# Patient Record
Sex: Female | Born: 1935 | Race: White | Hispanic: No | Smoking: Never smoker
Health system: Southern US, Community
[De-identification: ages and names within clinical notes are randomized; demographics above are authoritative.]

## PROBLEM LIST (undated history)

## (undated) DIAGNOSIS — M8580 Other specified disorders of bone density and structure, unspecified site: Secondary | ICD-10-CM

## (undated) DIAGNOSIS — I499 Cardiac arrhythmia, unspecified: Secondary | ICD-10-CM

## (undated) DIAGNOSIS — I10 Essential (primary) hypertension: Secondary | ICD-10-CM

## (undated) DIAGNOSIS — H269 Unspecified cataract: Secondary | ICD-10-CM

## (undated) DIAGNOSIS — M1990 Unspecified osteoarthritis, unspecified site: Secondary | ICD-10-CM

## (undated) DIAGNOSIS — I4891 Unspecified atrial fibrillation: Secondary | ICD-10-CM

## (undated) DIAGNOSIS — R55 Syncope and collapse: Secondary | ICD-10-CM

## (undated) HISTORY — PX: VAGINAL HYSTERECTOMY: SUR661

## (undated) HISTORY — PX: SHOULDER SURGERY: SHX246

## (undated) HISTORY — DX: Unspecified cataract: H26.9

## (undated) HISTORY — DX: Syncope and collapse: R55

## (undated) HISTORY — PX: KNEE ARTHROSCOPY: SUR90

## (undated) HISTORY — PX: KNEE ARTHROCENTESIS: SUR44

## (undated) HISTORY — PX: BREAST EXCISIONAL BIOPSY: SUR124

## (undated) HISTORY — PX: TOTAL KNEE ARTHROPLASTY: SHX125

## (undated) HISTORY — DX: Unspecified osteoarthritis, unspecified site: M19.90

## (undated) HISTORY — PX: ROTATOR CUFF REPAIR: SHX139

## (undated) HISTORY — DX: Unspecified atrial fibrillation: I48.91

## (undated) HISTORY — DX: Other specified disorders of bone density and structure, unspecified site: M85.80

---

## 1999-03-23 ENCOUNTER — Encounter: Payer: Self-pay | Admitting: Gynecology

## 1999-03-23 ENCOUNTER — Encounter: Admission: RE | Admit: 1999-03-23 | Discharge: 1999-03-23 | Payer: Self-pay | Admitting: Gynecology

## 1999-04-04 ENCOUNTER — Encounter: Payer: Self-pay | Admitting: Gynecology

## 1999-04-04 ENCOUNTER — Encounter: Admission: RE | Admit: 1999-04-04 | Discharge: 1999-04-04 | Payer: Self-pay | Admitting: Gynecology

## 2000-05-03 ENCOUNTER — Encounter: Payer: Self-pay | Admitting: Gynecology

## 2000-05-03 ENCOUNTER — Encounter: Admission: RE | Admit: 2000-05-03 | Discharge: 2000-05-03 | Payer: Self-pay | Admitting: Gynecology

## 2000-06-19 ENCOUNTER — Encounter: Payer: Self-pay | Admitting: Gynecology

## 2000-06-19 ENCOUNTER — Encounter: Admission: RE | Admit: 2000-06-19 | Discharge: 2000-06-19 | Payer: Self-pay | Admitting: Gynecology

## 2000-08-26 ENCOUNTER — Encounter: Admission: RE | Admit: 2000-08-26 | Discharge: 2000-09-20 | Payer: Self-pay | Admitting: Orthopedic Surgery

## 2001-09-05 ENCOUNTER — Encounter: Admission: RE | Admit: 2001-09-05 | Discharge: 2001-09-05 | Payer: Self-pay | Admitting: Gynecology

## 2001-09-05 ENCOUNTER — Encounter: Payer: Self-pay | Admitting: Gynecology

## 2001-09-12 ENCOUNTER — Other Ambulatory Visit: Admission: RE | Admit: 2001-09-12 | Discharge: 2001-09-12 | Payer: Self-pay | Admitting: Gynecology

## 2002-09-28 ENCOUNTER — Other Ambulatory Visit: Admission: RE | Admit: 2002-09-28 | Discharge: 2002-09-28 | Payer: Self-pay | Admitting: Gynecology

## 2002-09-28 ENCOUNTER — Encounter: Payer: Self-pay | Admitting: Family Medicine

## 2002-09-28 ENCOUNTER — Encounter: Admission: RE | Admit: 2002-09-28 | Discharge: 2002-09-28 | Payer: Self-pay | Admitting: Family Medicine

## 2003-10-13 ENCOUNTER — Other Ambulatory Visit: Admission: RE | Admit: 2003-10-13 | Discharge: 2003-10-13 | Payer: Self-pay | Admitting: Gynecology

## 2003-10-13 ENCOUNTER — Encounter: Admission: RE | Admit: 2003-10-13 | Discharge: 2003-10-13 | Payer: Self-pay | Admitting: Gynecology

## 2004-03-07 ENCOUNTER — Ambulatory Visit: Payer: Self-pay | Admitting: Family Medicine

## 2004-04-04 ENCOUNTER — Encounter: Admission: RE | Admit: 2004-04-04 | Discharge: 2004-05-08 | Payer: Self-pay | Admitting: Orthopedic Surgery

## 2004-08-28 ENCOUNTER — Ambulatory Visit: Payer: Self-pay | Admitting: Gastroenterology

## 2004-09-07 ENCOUNTER — Encounter (INDEPENDENT_AMBULATORY_CARE_PROVIDER_SITE_OTHER): Payer: Self-pay | Admitting: *Deleted

## 2004-09-07 ENCOUNTER — Ambulatory Visit: Payer: Self-pay | Admitting: Gastroenterology

## 2004-09-29 ENCOUNTER — Encounter: Admission: RE | Admit: 2004-09-29 | Discharge: 2004-09-29 | Payer: Self-pay | Admitting: Family Medicine

## 2004-10-30 ENCOUNTER — Other Ambulatory Visit: Admission: RE | Admit: 2004-10-30 | Discharge: 2004-10-30 | Payer: Self-pay | Admitting: Gynecology

## 2005-11-22 ENCOUNTER — Encounter: Admission: RE | Admit: 2005-11-22 | Discharge: 2005-11-22 | Payer: Self-pay | Admitting: Gynecology

## 2005-12-13 ENCOUNTER — Other Ambulatory Visit: Admission: RE | Admit: 2005-12-13 | Discharge: 2005-12-13 | Payer: Self-pay | Admitting: Gynecology

## 2006-01-02 ENCOUNTER — Ambulatory Visit: Payer: Self-pay | Admitting: Family Medicine

## 2006-04-19 ENCOUNTER — Ambulatory Visit: Payer: Self-pay | Admitting: Family Medicine

## 2006-05-30 ENCOUNTER — Ambulatory Visit: Payer: Self-pay | Admitting: Family Medicine

## 2006-06-26 ENCOUNTER — Encounter: Admission: RE | Admit: 2006-06-26 | Discharge: 2006-06-26 | Payer: Self-pay | Admitting: Orthopedic Surgery

## 2006-06-27 ENCOUNTER — Ambulatory Visit (HOSPITAL_BASED_OUTPATIENT_CLINIC_OR_DEPARTMENT_OTHER): Admission: RE | Admit: 2006-06-27 | Discharge: 2006-06-27 | Payer: Self-pay | Admitting: Orthopedic Surgery

## 2006-07-11 ENCOUNTER — Encounter: Admission: RE | Admit: 2006-07-11 | Discharge: 2006-08-19 | Payer: Self-pay | Admitting: Orthopedic Surgery

## 2006-11-28 ENCOUNTER — Encounter: Admission: RE | Admit: 2006-11-28 | Discharge: 2006-11-28 | Payer: Self-pay | Admitting: Gynecology

## 2006-12-19 ENCOUNTER — Other Ambulatory Visit: Admission: RE | Admit: 2006-12-19 | Discharge: 2006-12-19 | Payer: Self-pay | Admitting: Gynecology

## 2007-12-11 ENCOUNTER — Encounter: Admission: RE | Admit: 2007-12-11 | Discharge: 2007-12-11 | Payer: Self-pay | Admitting: Gynecology

## 2008-02-10 ENCOUNTER — Inpatient Hospital Stay (HOSPITAL_COMMUNITY): Admission: RE | Admit: 2008-02-10 | Discharge: 2008-02-14 | Payer: Self-pay | Admitting: Orthopedic Surgery

## 2008-03-08 ENCOUNTER — Encounter: Admission: RE | Admit: 2008-03-08 | Discharge: 2008-04-22 | Payer: Self-pay | Admitting: Orthopedic Surgery

## 2008-04-23 ENCOUNTER — Encounter: Admission: RE | Admit: 2008-04-23 | Discharge: 2008-05-05 | Payer: Self-pay | Admitting: Orthopedic Surgery

## 2009-01-26 ENCOUNTER — Encounter: Admission: RE | Admit: 2009-01-26 | Discharge: 2009-01-26 | Payer: Self-pay | Admitting: Gynecology

## 2009-02-03 ENCOUNTER — Encounter: Admission: RE | Admit: 2009-02-03 | Discharge: 2009-02-03 | Payer: Self-pay | Admitting: Gynecology

## 2009-04-23 HISTORY — PX: COLONOSCOPY: SHX174

## 2009-08-19 ENCOUNTER — Encounter (INDEPENDENT_AMBULATORY_CARE_PROVIDER_SITE_OTHER): Payer: Self-pay | Admitting: *Deleted

## 2009-10-14 ENCOUNTER — Encounter (INDEPENDENT_AMBULATORY_CARE_PROVIDER_SITE_OTHER): Payer: Self-pay | Admitting: *Deleted

## 2009-10-17 ENCOUNTER — Ambulatory Visit: Payer: Self-pay | Admitting: Gastroenterology

## 2009-11-01 ENCOUNTER — Ambulatory Visit: Payer: Self-pay | Admitting: Gastroenterology

## 2009-11-01 HISTORY — PX: POLYPECTOMY: SHX149

## 2009-11-02 ENCOUNTER — Encounter: Payer: Self-pay | Admitting: Gastroenterology

## 2010-02-07 ENCOUNTER — Encounter: Admission: RE | Admit: 2010-02-07 | Discharge: 2010-02-07 | Payer: Self-pay | Admitting: Gynecology

## 2010-02-20 ENCOUNTER — Encounter: Admission: RE | Admit: 2010-02-20 | Discharge: 2010-02-20 | Payer: Self-pay | Admitting: Gynecology

## 2010-05-25 NOTE — Letter (Signed)
Summary: [facility] Instructions  [facility]  [address] [place], [place] [address]   Phone: [phone]  Fax: [phone]       Rhonda [name]    1933/01/05    MRN: [id number]        Procedure Day /Date:  Tuesday 11/01/09     Arrival Time:  8:30 a.m.     Procedure Time:  9:30 a.m.     Location of Procedure:                    _x_  [facility] (4th Floor)                        PREPARATION FOR COLONOSCOPY WITH MOVIPREP   Starting 5 days prior to your procedure  Thursday 10/27/09 do not eat nuts, seeds, popcorn, corn, beans, peas,  salads, or any raw vegetables.  Do not take any fiber supplements (e.g. Metamucil, Citrucel, and Benefiber).  THE DAY BEFORE YOUR PROCEDURE         DATE:  10/31/09   DAY:  Monday 1.  Drink clear liquids the entire day-NO SOLID FOOD  2.  Do not drink anything colored red or purple.  Avoid juices with pulp.  No orange juice.  3.  Drink at least 64 oz. (8 glasses) of fluid/clear liquids during the day to prevent dehydration and help the prep work efficiently.  CLEAR LIQUIDS INCLUDE: Water Jello Ice Popsicles Tea (sugar ok, no milk/cream) Powdered fruit flavored drinks Coffee (sugar ok, no milk/cream) Gatorade Juice: apple, white grape, white cranberry  Lemonade Clear bullion, consomm, broth Carbonated beverages (any kind) Strained chicken noodle soup Hard Candy                             4.  In the morning, mix first dose of MoviPrep solution:    Empty 1 Pouch A and 1 Pouch B into the disposable container    Add lukewarm drinking water to the top line of the container. Mix to dissolve    Refrigerate (mixed solution should be used within 24 hrs)  5.  Begin drinking the prep at 5:00 p.m. The MoviPrep container is divided by 4 marks.   Every 15 minutes drink the solution down to the next mark (approximately 8 oz) until the full liter is complete.   6.  Follow completed prep with 16 oz of clear liquid of your  choice (Nothing red or purple).  Continue to drink clear liquids until bedtime.  7.  Before going to bed, mix second dose of MoviPrep solution:    Empty 1 Pouch A and 1 Pouch B into the disposable container    Add lukewarm drinking water to the top line of the container. Mix to dissolve    Refrigerate  THE DAY OF YOUR PROCEDURE      DATE:  11/01/09   DAY:  Tuesday  Beginning at  4:30 a.m. (5 hours before procedure):         1. Every 15 minutes, drink the solution down to the next mark (approx 8 oz) until the full liter is complete.  2. Follow completed prep with 16 oz. of clear liquid of your choice.    3. You may drink clear liquids until  7:30 a.m.  (2 HOURS BEFORE PROCEDURE).   MEDICATION INSTRUCTIONS  Unless otherwise instructed, you should take regular prescription medications with a  small sip of water   as early as possible the morning of your procedure.  Additional medication instructions: Hold Micardis HCT day of procedure.         OTHER INSTRUCTIONS  You will need a responsible adult at least 75 years of age to accompany you and drive you home.   This person must remain in the waiting room during your procedure.  Wear loose fitting clothing that is easily removed.  Leave jewelry and other valuables at home.  However, you may wish to bring a book to read or  an iPod/MP3 player to listen to music as you wait for your procedure to start.  Remove all body piercing jewelry and leave at home.  Total time from sign-in until discharge is approximately 2-3 hours.  You should go home directly after your procedure and rest.  You can resume normal activities the  day after your procedure.  The day of your procedure you should not:   Drive   Make legal decisions   Operate machinery   Drink alcohol   Return to work  You will receive specific instructions about eating, activities and medications before you leave.    The above instructions have been reviewed  and explained to me by   Ezra Sites RN  October 17, 2009 10:50 AM    I fully understand and can verbalize these instructions _____________________________ Date _________

## 2010-05-25 NOTE — Procedures (Signed)
Summary: Colonoscopy  Patient: Embry Manrique Note: All result statuses are Final unless otherwise noted.  Tests: (1) Colonoscopy (COL)   COL Colonoscopy           DONE     [facility]     [address]     [place], [place]  [address]           COLONOSCOPY PROCEDURE REPORT           PATIENT:  Rhonda [name], Rhonda [name]  MR#:  [id number]     BIRTHDATE:  [birth year], 76 yrs. old  GENDER:  female     ENDOSCOPIST:  Judie Petit T. Russella Dar, MD, [facility]           PROCEDURE DATE:  11/01/2009     PROCEDURE:  Colonoscopy with hot biopsy     ASA CLASS:  Class II     INDICATIONS:  1) surveillance and high-risk screening  2)     follow-up of polyp, adenomatous polyps, 1994, 08/2004,     MEDICATIONS:   Fentanyl 75 mcg IV, Versed 8 mg IV     DESCRIPTION OF PROCEDURE:   After the risks benefits and     alternatives of the procedure were thoroughly explained, informed     consent was obtained.  Digital rectal exam was performed and     revealed no abnormalities.   The LB PCF-H180AL [id number] endoscope     was introduced through the anus and advanced to the cecum, which     was identified by both the appendix and ileocecal valve, without     limitations.  The quality of the prep was excellent, using     MoviPrep.  The instrument was then slowly withdrawn as the colon     was fully examined.     <<PROCEDUREIMAGES>>     FINDINGS:  Moderate diverticulosis was found in the sigmoid colon.     A sessile polyp was found in the distal rectum. It was 5 mm in     size. With hot biopsy forceps, biopsy was obtained and sent to     pathology.  A normal appearing cecum, ileocecal valve, and     appendiceal orifice were identified. The ascending, hepatic     flexure, transverse, splenic flexure, descending, colon, appeared     unremarkable.  Retroflexed views in the rectum revealed internal     hemorrhoids, moderate.  The time to cecum =  4.5  minutes. The     scope was then withdrawn (time =  11.25  min) from the patient  and     the procedure completed.           COMPLICATIONS:  None           ENDOSCOPIC IMPRESSION:     1) Moderate diverticulosis in the sigmoid colon     2) 5 mm sessile polyp in the rectum     3) Internal hemorrhoids           RECOMMENDATIONS:     1) No aspirin or NSAID's for 2 weeks     2) Await pathology results     3) High fiber diet with liberal fluid intake.     4) Repeat Colonoscopy in 5 years pending pathology review           Semaj Kham T. Russella Dar, MD, [facility]           CC: Joette Catching, MD           n.  eSIGNED:   Joanny Dupree T. Chisa Kushner at 11/01/2009 10:06 AM           Lilyan Punt, [id number]  Note: An exclamation mark (!) indicates a result that was not dispersed into the flowsheet. Document Creation Date: 11/01/2009 10:07 AM _______________________________________________________________________  (1) Order result status: Final Collection or observation date-time: 11/01/2009 10:03 Requested date-time:  Receipt date-time:  Reported date-time:  Referring Physician:   Ordering Physician: Claudette Head [phone]) Specimen Source:  Source: Launa Grill Order Number: [id number] Lab site:   Appended Document: Colonoscopy     Colonoscopy  Procedure date:  11/01/2009  Findings:      Location:  [facility].    Procedures Next Due Date:    Colonoscopy: 10/2014

## 2010-05-25 NOTE — Letter (Signed)
Summary: Patient Notice-Hyperplastic Polyps  [facility]  [address] [place], [place] [address]   Phone: [phone]  Fax: [phone]        November 02, 2009 MRN: [id number]    Rhonda [name] [address] [place] [address] [place], [place]  [address]    Dear [name],  I am pleased to inform you that the colon polyp(s) removed during your recent colonoscopy was (were) found to be hyperplastic. These types of polyps are NOT pre-cancerous.  It is my recommendation that you have a repeat colonoscopy examination in 5 years for routine colorectal cancer screening.  Should you develop new or worsening symptoms of abdominal pain, bowel habit changes or bleeding from the rectum or bowels, please schedule an evaluation with either your primary care physician or with me.  Continue treatment plan as outlined the day of your exam.  Please call us if you are having persistent problems or have questions about your condition that have not been fully answered at this time.  Sincerely,  Meryl Dare MD [facility]  This letter has been electronically signed by your physician.  Appended Document: Patient Notice-Hyperplastic Polyps letter mailed.

## 2010-05-25 NOTE — Letter (Signed)
Summary: Colonoscopy Letter  [facility]  [address] [place], [place] [address]   Phone: [phone]  Fax: [phone]      August 19, 2009 MRN: [id number]   Rhonda [name] [address] [place] [address] [place], [place]  [address]   Dear [name],   According to your medical record, it is time for you to schedule a Colonoscopy. The American Cancer Society recommends this procedure as a method to detect early colon cancer. Patients with a family history of colon cancer, or a personal history of colon polyps or inflammatory bowel disease are at increased risk.  This letter has beeen generated based on the recommendations made at the time of your procedure. If you feel that in your particular situation this may no longer apply, please contact our office.  Please call our office at [phone] to schedule this appointment or to update your records at your earliest convenience.  Thank you for cooperating with [place] to provide you with the very best care possible.   Sincerely,  Judie Petit T. Russella Dar, M.D.  [facility] [phone]

## 2010-05-25 NOTE — Miscellaneous (Signed)
Summary: [facility]  Clinical Lists Changes  Medications: Added new medication of MOVIPREP 100 GM  SOLR (PEG-KCL-NACL-NASULF-NA ASC-C) As per prep instructions. - Signed Rx of MOVIPREP 100 GM  SOLR (PEG-KCL-NACL-NASULF-NA ASC-C) As per prep instructions.;  #1 x 0;  Signed;  Entered by: Ezra Sites RN;  Authorized by: Meryl Dare MD [facility];  Method used: Electronically to [facility] [phone]*, [address], [place], [place], [place]  [address], Ph: [phone] or [phone], Fax: [phone] Observations: Added new observation of NKA: T (10/17/2009 9:58)    Prescriptions: MOVIPREP 100 GM  SOLR (PEG-KCL-NACL-NASULF-NA ASC-C) As per prep instructions.  #1 x 0   Entered by:   Ezra Sites RN   Authorized by:   Meryl Dare MD [facility]   Signed by:   Ezra Sites RN on 10/17/2009   Method used:   Electronically to        [facility] [phone]* (retail)       [address]       [place], [place]  [address]       Ph: [phone] or [phone]       Fax: [phone]   RxID:   [id number]

## 2010-09-05 NOTE — Discharge Summary (Signed)
NAMEJASEY, Rhonda [name]               ACCOUNT NO.:  [id number]   MEDICAL RECORD NO.:  [id number]          PATIENT TYPE:  INP   LOCATION:  5035                         FACILITY:  [facility]   PHYSICIAN:  Rodney A. Mortenson, M.D.DATE OF BIRTH:  04-20-1933   DATE OF ADMISSION:  02/10/2008  DATE OF DISCHARGE:  02/14/2008                               DISCHARGE SUMMARY   ADMISSION DIAGNOSIS:  Osteoarthritis, left knee.   DISCHARGE DIAGNOSES:  1. Osteoarthritis, left knee.  2. Hypertension.  3. Posthemorrhagic anemia.  4. Hypokalemia.   PROCEDURE:  Left total knee arthroplasty.   HISTORY:  Rhonda [name] is a very pleasant 77 year old white married female with  constant moderately painful left knee.  She has mainly throbbing and  aching pain now with occasional sharp stabbing pain.  She had previous  arthroscopy in 2005 of the left knee.  She now has pain with walking,  standing, and arising from a chair.  Pain at night as well as with  activities of daily living.  She has radiographic bone-on-bone  osteoarthritis.  She is indicated now for a left total knee  arthroplasty.   HOSPITAL COURSE:  A 77 year old female admitted on February 10, 2008,  after appropriate laboratory studies were obtained as well as 1 g Ancef  IV on call to the operating room.  She was taken to the operating room  where she underwent a left total knee arthroplasty.  She tolerated the  procedure well.  She was placed on Dilaudid PCA pump.  Arixtra 2.5 mg q.  8 a.m. was started subcutaneously for antithrombotic prophylaxis.  Consult PT, OT, care management were made.  Ambulating weight bear as  tolerated.  CPM 0-60 degrees for 6-8 hours per day, increase by 5-10  degrees per day.  Foley was placed intraoperatively.  She allowed out of  bed to chair the following day.  Weaned off her PCA and her O2 keeping  her sats greater than 92% over the next 24-48 hours.  Foley was  discontinued on February 12, 2008, after the morning therapy  session.  She did have hypokalemia on February 13, 2008.  She was started on 20 mEq  of KCl b.i.d.  Chest x-ray to rule out pneumonia as well as a UA clean  catch for routine C&S were ordered.  CBC and BMET in morning.  MiraLax  17 g in 8 ounces of water.  Dulcolax suppository on February 14, 2008,  was given and she was discharged home after she had bowel movement.   EKG showed normal sinus rhythm with normal EKG.   LABORATORY STUDIES:  Admitted with hemoglobin 15.7, hematocrit 47.3%,  white count 6900, and platelets 261,000.  Discharge hemoglobin 9.7,  hematocrit 28.0%, white count 7800, and platelets 23,000.  Preop sodium  137, potassium 4.6, chloride 100, CO2 28, glucose 83, BUN 15, and  creatinine 0.80.  Discharge sodium 138, potassium 3.8, chloride 100, CO2  30, glucose 111, BUN 9, and creatinine 0.73.  Preop GFR was greater than  60, discharge was greater than 60.  Total protein preop 7.0, albumin  4.1, AST 26, ALT  28, ALP 91, and bilirubin 0.9.  Urinalysis on February 04, 2008, revealed rare epithelials and negative leukocyte esterase.  Urinalysis on February 12, 2008, revealed cloudy with moderate amount of  hemoglobin, few epithelium, 0-2 whites, 7-10 reds, and a few bacteria.  Urine cultures of February 04, 2008 and February 13, 2008 were felt no  growth.   DISCHARGE INSTRUCTIONS:  No restriction on her diet.  Follow the blue  instruction sheet and keep the incision clean and dry and change  dressing daily.  She may use a walker ambulating weightbearing as  tolerated.  No driving or lifting for 6 weeks.  CPM 0-70 degrees for 6-8  hours per day and then increase by 5-10 degrees per day.  Percocet 5/325  one to two tablets every 4 hours a day for pain.  Robaxin 500 mg 1  tablet every 6-8 hours as needed for spasms.  Arixtra 2.5 mg inject  daily at 8 a.m. as instructed.  Last dose was on February 17, 2008.  Then  she is to begin aspirin 81 mg on February 18, 2008, for 1 month.  Use   Colace and MiraLax for constipation.  Continue on the Lovenox  instructed.  Follow back up with [place] on February 23, 2008, for a recheck  evaluation with Dr. Chaney Malling.      Rhonda [name], P.A.-C.      Rodney A. Chaney Malling, M.D.  Electronically Signed    BDP/MEDQ  D:  03/21/2008  T:  03/22/2008  Job:  [id number]

## 2010-09-05 NOTE — Op Note (Signed)
Rhonda [name], Rhonda [name]               ACCOUNT NO.:  [id number]   MEDICAL RECORD NO.:  [id number]          PATIENT TYPE:  INP   LOCATION:  5035                         FACILITY:  [facility]   PHYSICIAN:  Rodney A. Mortenson, M.D.DATE OF BIRTH:  [birth year]   DATE OF PROCEDURE:  02/10/2008  DATE OF DISCHARGE:                               OPERATIVE REPORT   PREOPERATIVE DIAGNOSIS:  End-stage osteoarthritis, left knee.   POSTOPERATIVE DIAGNOSIS:  End-stage osteoarthritis, left knee.   OPERATION:  Left total knee replacement using DePuy components with a  standard left cemented femoral component and a cemented size 3 MBT keel  tibial tray with a 15-mm poly insert and an LCS metal back patella,  standard cemented.   SURGEON:  Lenard Galloway. Chaney Malling, MD   ASSISTANT:  Oris Drone. Petrarca, PA-C   ANESTHESIA:  Femoral nerve block and general.   PROCEDURE:  The patient was placed on the operative table in the supine  position.  Pneumatic tourniquet was brought at the left upper thigh.  The entire left lower extremity was prepped with DuraPrep and draped out  in the usual manner.  Leg was wrapped out in Esmarch.  Tourniquet was  elevated.  An incision made starting at tibial tubercle and carried  about 3 inches as well as to 4 inches above the superior pole of  patella.  Skin edges were retracted.  Bleeders were coagulated.  A long  medial parapatellar incision was then done and the patella was everted.  The fat pad was removed.  The medial lateral meniscus were excised.  Osteophytes on both the medial and lateral condyles were debrided and  the femur was sized and this measured to be standard.  Schanz pins were  placed in distal femur and proximal tibia in the standard manner and the  femoral and tibial arrays were applied.  We then started registration to  define the mechanical axis.  First the femoral head center was  registered and the tibial mechanical axis.  We then defined the proximal  tibial  plateau and then defined the distal femoral condyles and  epicondyles following all the computer prompting.  Once this was done,  this was entered in the computer and suggestions were tibial resections  were made.  Following these recommendations, the tibial cutting block  was placed on the tibia and lined up according to the promptings and  this was then locked in place with locking pins.  Capture guide was  applied and proximal tibia was resected.  The confirmation guide was  then placed in the cut surface, several small adjustments were made, but  an excellent cut was achieved.  Next, soft tissue balancing was done  first in extension and then flexion using a tension device.  This was  all inserted into the computer program.  The femoral implant planning  was then registered in a standard manner and then the femoral resections  were done following computer promptings.  First, anterior femoral  resection was done.  Anterior and posterior cuts were made.  Then block  #2 was placed over the distal cut surface  of the femur, locked in place  following computer promptings and distal end of the femur was amputated.  Confirmation guide was used and several minor corrections were made.  The mechanical spacer block was inserted in both flexion/extension,  there was wonderful balancing of both the collateral ligaments in  flexion and extension.  Next, the final chamfer cutting guide was placed  over distal end of the femur locked in place and the chamfer cuts and  drill holes were made.  This was then cleaned up and osteophytes removal  of the posterior aspect of the medial femoral condyle.  The tibia was  then subluxed anteriorly and the tibia trial was put in place and locked  in place.  Next, the smokestack was inserted and a drill hole was placed  in the proximal tibia.  The winged keel guide was then driven down in  through the trial plate and locked in position.  Next, the 15-mm poly  was  inserted and the trial standard size femoral component was inserted  and knee put through a full range of motion.  There was an excellent  range of motion.  Full flexion, full extension, excellent balance of  ligaments in flexion extension.  No varus or valgus instability.  AP  drawer was negative.  There was excellent tracking of the tibia on the  femur.  Next, the posterior aspect of patella was amputated and the  patella trial was inserted.  The knee could then be flexed, extended,  and the patella trial tracked very nicely midline with no tilting.  No  lateral release was felt to be necessary.  I was very pleased with all  the settings and the balancing of the ligaments.  All the trial  components were then removed and pulsating lavage was used to remove all  debris.  Glue was mixed.  Each of the components was then glued in place  sequentially starting first with the tibia and then the distal femur,  and then the patella.  All excess glue was removed.  Once the glue had  cured, the trial poly was removed.  Tourniquet was dropped.  There was a  bleeder in the popliteal area and hemoclips were used to clamp off the  small vessel.  Nice hemostasis was achieved.  FloSeal was also placed in  the wound.  Throughout the procedure, all debris had been removed with a  pulsating lavage.  The long medial parapatellar incision was then closed  with interrupted Ethibond sutures.  Vicryl was used to close the  subcutaneous tissue and stainless steel staples were used to close the  skin.  Marcaine was placed around the calves prior to closure.  Sterile  dressing was applied and the patient returned to recovery room in  excellent condition.  Technically, this went extremely well.  Drains  none.  Complications none.  Very pleased with final construct.      Rodney A. Chaney Malling, M.D.  Electronically Signed     RAM/MEDQ  D:  02/10/2008  T:  02/11/2008  Job:  [id number]

## 2010-09-08 NOTE — Op Note (Signed)
NAMEALONNI, Rhonda [name]               ACCOUNT NO.:  [id number]   MEDICAL RECORD NO.:  [id number]          PATIENT TYPE:  AMB   LOCATION:  DSC                          FACILITY:  [facility]   PHYSICIAN:  Rodney A. Mortenson, M.D.DATE OF BIRTH:  August 10, 1932   DATE OF PROCEDURE:  06/27/2006  DATE OF DISCHARGE:                               OPERATIVE REPORT   JUSTIFICATION:  A 76 year old female seen by Dr. Lysbeth Galas who presents  with a 3-year history of progressive slowly increasing pain about her  knee.  She has trouble going up and down stairs.  There is acute  tenderness along the medial joint line and slight tenderness along  lateral joint line.  An MRI was done that showed severe tricompartmental  arthritis and complex tearing of the lateral meniscus.  Because of  persistent pain and discomfort, it felt that arthroscopic evaluation and  treatment were indicated and necessary.  Questions answered and  encouraged preoperatively.  Complications discussed.  It was clearly  explained that we could address the meniscal problems but not the  arthritic problems in the knee, and hopefully, this would reduce her  pain and discomfort, and she clearly understand this and wishes to  proceed.   JUSTIFICATION OF OUTPATIENT SURGERY:  Minimal morbidity.   PREOPERATIVE DIAGNOSIS:  Tear, lateral meniscus, right knee.   POSTOPERATIVE DIAGNOSIS:  Extremely complex extensive tear, lateral  meniscus, right knee; tear medial meniscus; osteophyte intercondylar  notch; areas of severe osteoarthritis lateral femoral condyle and medial  femoral condyle and lateral tibial plateau.   OPERATION:  Arthroscopy; medial lateral meniscectomy; removal of  osteophytes intercondylar notch.   SURGEON:  Lenard Galloway. Chaney Malling, M.D.   ANESTHESIA:  MAC followed by general.   PATHOLOGY:  With the arthroscope, a very careful examination of the knee  was undertaken.  Patellofemoral joints showed some chondromalacia of the  patella, but over the anterior aspect, the lateral femoral condyle was  total loss for articular cartilage.  Over the weightbearing area of the  medial femoral condyle was extensive cartilage loss.  The anterior  cruciate ligament was intact, but there was a huge osteophyte in the  intercondylar notch, and with full extension of the knee, the osteophyte  banked up against the femoral notch area.  In the medial compartment,  there was some fraying and tearing of the leading edge of the posterior  third of the medial meniscus.  In the lateral meniscus, there was a huge  tear of the anterior horn which was displaced and a complex fraying and  tearing of the entire lateral meniscus which extends from the anterior  horn all the way to the posterior horn.  There is an area of total loss  of the articular cartilage over the weightbearing area over the lateral  tibial plateau.   PROCEDURE:  The patient was placed on the operating table in the supine  position.  Pneumatic tourniquet about the left upper thigh.  An infusion  cannula was placed in the superior and medial pouch and the knee  distended with saline.  Anteromedial and anterolateral portals were  made,  and the arthroscope was introduced.  Findings were described as  above.  Attention first turned to the intercondylar notch area.  There  is a huge osteophyte in the tibial spine area that was banking up  against the intercondylar notch.  Through the lateral portal an  osteotome was inserted and the base of the osteophyte was amputated.  A  pituitary was inserted and a large osteophyte was removed as a single  large entity, and this decompressed the problem very nicely.  Attention  was then turned to the medial compartment.  There was a gentle tearing  of the posterior third of the medial meniscus, and through both portals,  a series of baskets were inserted, and this area was debrided.  This was  followed by the intra-articular shaver.   All debris was removed, and the  posterior rim was smooth and balanced in a nice transition to the mid  third of medial meniscus.  Arthroscope was then brought to the lateral  compartment.  There was a huge displaced tear of the anterior horn, and  this was debrided with chondroplasty shaver.  The arthroscope was then  induced in the posterior part of the lateral compartment.  A series of  baskets were inserted, and there was a very complex tear of the lateral  meniscus which essentially involved the entire meniscus.  Once this was  debrided, intra-articular shaver was introduced through both portals in  sequence, and a subtotal lateral meniscectomy was accomplished.  Again,  there was a very large area of total loss of articular cartilage of the  weightbearing area of the lateral tibial plateau.  Marcaine was then  placed, and a large bulky pressure dressing applied, and the patient  returned recovery room in excellent condition.  Technically, this  procedure went extremely well.   FOLLOW-UP CARE:  1. To my office on Wednesday.  2. Mepergan Fortes for pain.  3. Usual postoperative instructions given.  4. A copy of this note to Dr. Joette Catching.           ______________________________  Lenard Galloway. Chaney Malling, M.D.     RAM/MEDQ  D:  06/27/2006  T:  06/27/2006  Job:  [id number]   cc:   [name], M.D.

## 2011-01-18 ENCOUNTER — Other Ambulatory Visit: Payer: Self-pay | Admitting: Gynecology

## 2011-01-18 DIAGNOSIS — Z1231 Encounter for screening mammogram for malignant neoplasm of breast: Secondary | ICD-10-CM

## 2011-01-22 LAB — COMPREHENSIVE METABOLIC PANEL
AST: 26
Alkaline Phosphatase: 91
CO2: 28
Calcium: 9.6
Chloride: 100
GFR calc non Af Amer: >60
Glucose, Bld: 83
Potassium: 4.6
Total Bilirubin: 0.9

## 2011-01-22 LAB — BASIC METABOLIC PANEL
BUN: 14
BUN: 16
CO2: 29
CO2: 30
Calcium: 8 — ABNORMAL LOW
Calcium: 8.1 — ABNORMAL LOW
Calcium: 8.2 — ABNORMAL LOW
Chloride: 103
Creatinine, Ser: 0.73
Creatinine, Ser: 0.98
Creatinine, Ser: 1.06
GFR calc Af Amer: >60
GFR calc Af Amer: >60
GFR calc non Af Amer: 51 — ABNORMAL LOW
GFR calc non Af Amer: 56 — ABNORMAL LOW
GFR calc non Af Amer: >60
Glucose, Bld: 106 — ABNORMAL HIGH
Glucose, Bld: 111 — ABNORMAL HIGH
Potassium: 3.4 — ABNORMAL LOW
Potassium: 3.8
Sodium: 138
Sodium: 139

## 2011-01-22 LAB — CBC
HCT: 27.3 — ABNORMAL LOW
HCT: 28 — ABNORMAL LOW
HCT: 38
HCT: 38
HCT: 47.3 — ABNORMAL HIGH
Hemoglobin: 12.6
Hemoglobin: 12.6
Hemoglobin: 9.7 — ABNORMAL LOW
MCHC: 33.2
MCV: 91.4
MCV: 91.8
Platelets: 178
Platelets: 241
Platelets: 261
RBC: 3.33 — ABNORMAL LOW
RBC: 4.17
RBC: 5.17 — ABNORMAL HIGH
RDW: 12.9
RDW: 13
WBC: 14.7 — ABNORMAL HIGH
WBC: 6.9
WBC: 7.8

## 2011-01-22 LAB — URINE MICROSCOPIC-ADD ON

## 2011-01-22 LAB — URINE CULTURE: Culture: NO GROWTH

## 2011-01-22 LAB — URINALYSIS, ROUTINE W REFLEX MICROSCOPIC
Bilirubin Urine: NEGATIVE
Glucose, UA: NEGATIVE
Ketones, ur: NEGATIVE
Nitrite: NEGATIVE
Nitrite: NEGATIVE
Protein, ur: NEGATIVE
Specific Gravity, Urine: 1.015
pH: 5.5
pH: 6

## 2011-01-22 LAB — DIFFERENTIAL
Basophils Absolute: 0.1
Basophils Relative: 1
Lymphs Abs: 1.4
Monocytes Absolute: 0.4
Neutro Abs: 5

## 2011-01-22 LAB — PROTIME-INR
INR: 0.9
Prothrombin Time: 12.3

## 2011-02-22 ENCOUNTER — Other Ambulatory Visit: Payer: Self-pay | Admitting: Gynecology

## 2011-02-22 ENCOUNTER — Ambulatory Visit: Payer: Self-pay

## 2011-02-23 ENCOUNTER — Ambulatory Visit
Admission: RE | Admit: 2011-02-23 | Discharge: 2011-02-23 | Disposition: A | Discharge status: Home / Self Care | Payer: BLUE CROSS/BLUE SHIELD | Source: Ambulatory Visit | Attending: Gynecology | Admitting: Gynecology

## 2012-02-01 ENCOUNTER — Other Ambulatory Visit: Payer: Self-pay | Admitting: Gynecology

## 2012-02-01 DIAGNOSIS — Z1231 Encounter for screening mammogram for malignant neoplasm of breast: Secondary | ICD-10-CM

## 2012-02-27 ENCOUNTER — Ambulatory Visit
Admission: RE | Admit: 2012-02-27 | Discharge: 2012-02-27 | Disposition: A | Discharge status: Home / Self Care | Payer: Medicare Other | Source: Ambulatory Visit | Attending: Gynecology | Admitting: Gynecology

## 2012-02-27 DIAGNOSIS — Z1231 Encounter for screening mammogram for malignant neoplasm of breast: Secondary | ICD-10-CM

## 2013-02-11 ENCOUNTER — Other Ambulatory Visit: Payer: Self-pay

## 2013-02-11 DIAGNOSIS — Z1231 Encounter for screening mammogram for malignant neoplasm of breast: Secondary | ICD-10-CM

## 2013-03-11 ENCOUNTER — Ambulatory Visit
Admission: RE | Admit: 2013-03-11 | Discharge: 2013-03-11 | Disposition: A | Discharge status: Home / Self Care | Payer: Medicare Other | Source: Ambulatory Visit

## 2013-03-11 DIAGNOSIS — Z1231 Encounter for screening mammogram for malignant neoplasm of breast: Secondary | ICD-10-CM

## 2013-12-10 ENCOUNTER — Encounter: Payer: Self-pay | Admitting: Gastroenterology

## 2014-01-07 DIAGNOSIS — R42 Dizziness and giddiness: Secondary | ICD-10-CM | POA: Insufficient documentation

## 2014-01-07 DIAGNOSIS — E785 Hyperlipidemia, unspecified: Secondary | ICD-10-CM | POA: Insufficient documentation

## 2014-01-21 DIAGNOSIS — J0100 Acute maxillary sinusitis, unspecified: Secondary | ICD-10-CM | POA: Insufficient documentation

## 2014-02-08 ENCOUNTER — Other Ambulatory Visit: Payer: Self-pay

## 2014-02-08 DIAGNOSIS — Z1231 Encounter for screening mammogram for malignant neoplasm of breast: Secondary | ICD-10-CM

## 2014-03-16 ENCOUNTER — Telehealth: Payer: Self-pay | Admitting: Cardiology

## 2014-03-16 NOTE — Telephone Encounter (Signed)
Received records from [facility])  (Dr Dione Housekeeper) for appointment with Dr Percival Spanish on 05/05/14.  Records given to [facility] (medical records) for Dr Hochrein's schedule on 05/05/14. lp

## 2014-03-25 ENCOUNTER — Ambulatory Visit: Payer: Self-pay

## 2014-04-14 ENCOUNTER — Ambulatory Visit: Payer: Self-pay

## 2014-04-23 DIAGNOSIS — R55 Syncope and collapse: Secondary | ICD-10-CM

## 2014-04-23 HISTORY — DX: Syncope and collapse: R55

## 2014-04-28 ENCOUNTER — Telehealth: Payer: Self-pay | Admitting: Cardiology

## 2014-04-28 NOTE — Telephone Encounter (Signed)
Received records from [facility] (Dr Dione Housekeeper) for appointment with Dr Percival Spanish on 05/05/14.  Records given to [facility] (medical records) for Dr Hochrein's schedule on 05/05/14.  lp

## 2014-05-05 ENCOUNTER — Ambulatory Visit (INDEPENDENT_AMBULATORY_CARE_PROVIDER_SITE_OTHER): Payer: Self-pay | Admitting: Cardiology

## 2014-05-05 ENCOUNTER — Encounter: Payer: Self-pay | Admitting: Cardiology

## 2014-05-05 VITALS — BP 122/84 | HR 74 | Ht 65.0 in | Wt 162.0 lb

## 2014-05-05 DIAGNOSIS — R55 Syncope and collapse: Secondary | ICD-10-CM | POA: Insufficient documentation

## 2014-05-05 NOTE — Progress Notes (Signed)
   HPI The patient presents for evaluation of 6 feet. This has been at the beginning of November. She was seated at her records are in her house. She had not eaten breakfast. She felt nauseated and lightheaded. She got herself up to a chair where she probably passed out for a few seconds. She was diaphoretic when she woke up she was otherwise well. She has not had any of these events since then. She doesn't feel any palpitations, presyncope or syncope. She has no orthostatic symptoms. She denies any chest pressure, neck or arm discomfort. She remains active. She did exercise most of her life. She did have one episode of syncope 10 years ago and wore a monitor but had no etiology identified. She otherwise has no past cardiac history.  Of note she did see Dr. Edrick Oh.  I reviewed the factors. She had normal lites. She had normal TSH.  No Known Allergies  MEDS:  None   PMH: None  PSH:  Vaginal hysterectomy, oophorectomy, total knee replacement left, shoulder surgery left, multiple knee arthroscopies   Family History  Problem Relation Age of Onset  . Heart failure Mother   . Hypertension Sister   . Hypertension Brother     History   Social History  . Marital Status: Married    Spouse Name: N/A    Number of Children: 1  . Years of Education: N/A   Occupational History  . Not on file.   Social History Main Topics  . Smoking status: Never Smoker   . Smokeless tobacco: Not on file  . Alcohol Use: Not on file  . Drug Use: Not on file  . Sexual Activity: Not on file   Other Topics Concern  . Not on file   Social History Narrative   Lives with husband.      ROS:  As stated in the HPI and negative for all other systems.  PHYSICAL EXAM BP 122/84 mmHg  Pulse 74  Ht 5\' 5"  (1.651 m)  Wt 162 lb (73.483 kg)  BMI 26.96 kg/m2  GENERAL:  Well appearing HEENT:  Pupils equal round and reactive, fundi not visualized, oral mucosa unremarkable NECK:  No jugular venous distention, waveform  within normal limits, carotid upstroke brisk and symmetric, no bruits, no thyromegaly LYMPHATICS:  No cervical, inguinal adenopathy LUNGS:  Clear to auscultation bilaterally BACK:  No CVA tenderness CHEST:  Unremarkable HEART:  PMI not displaced or sustained,S1 and S2 within normal limits, no S3, no S4, no clicks, no rubs, no murmurs ABD:  Flat, positive bowel sounds normal in frequency in pitch, no bruits, no rebound, no guarding, no midline pulsatile mass, no hepatomegaly, no splenomegaly EXT:  2 plus pulses throughout, no edema, no cyanosis no clubbing SKIN:  No rashes no nodules NEURO:  Cranial nerves II through XII grossly intact, motor grossly intact throughout PSYCH:  Cognitively intact, oriented to person place and time   EKG:  Sinus rhythm, 74, left axis deviation, mild QTC prolongation, no T wave changes.  05/05/2014   ASSESSMENT AND PLAN  SYNCOPE:  I suspect this was a vagal episode. I don't think further cardiovascular testing would be warranted if she has no symptoms. However, she'll let me know if this happens in the future.  QT PROLONGED:  This is a mild abnormality. Avoid QT prolonging drugs in the future but otherwise would not suggest that this was related. No further workup is suggested.

## 2014-05-05 NOTE — Patient Instructions (Signed)
The current medical regimen is effective;  continue present plan and medications.  Follow up as needed.  Thank you for choosing [facility]!!

## 2014-05-17 ENCOUNTER — Ambulatory Visit: Payer: Self-pay

## 2014-09-09 ENCOUNTER — Encounter: Payer: Self-pay | Admitting: Gastroenterology

## 2014-10-04 ENCOUNTER — Encounter: Payer: Self-pay | Admitting: Gastroenterology

## 2014-11-03 ENCOUNTER — Ambulatory Visit: Payer: Self-pay

## 2014-11-05 ENCOUNTER — Ambulatory Visit
Admission: RE | Admit: 2014-11-05 | Discharge: 2014-11-05 | Disposition: A | Discharge status: Home / Self Care | Payer: Medicare Other | Source: Ambulatory Visit

## 2014-11-05 DIAGNOSIS — Z1231 Encounter for screening mammogram for malignant neoplasm of breast: Secondary | ICD-10-CM

## 2014-11-26 ENCOUNTER — Ambulatory Visit (AMBULATORY_SURGERY_CENTER): Payer: Self-pay

## 2014-11-26 VITALS — Ht 66.0 in | Wt 161.0 lb

## 2014-11-26 DIAGNOSIS — Z86010 Personal history of colonic polyps: Secondary | ICD-10-CM

## 2014-11-26 NOTE — Progress Notes (Signed)
No hx of anesthesia complications No egg or soy allergies Pt doesn't take diet drugs or medications Not on home 02

## 2014-12-09 ENCOUNTER — Encounter: Payer: Self-pay | Admitting: Gastroenterology

## 2014-12-09 ENCOUNTER — Ambulatory Visit (AMBULATORY_SURGERY_CENTER): Payer: Medicare Other | Admitting: Gastroenterology

## 2014-12-09 VITALS — BP 149/70 | HR 65 | Temp 97.2°F | Resp 24 | Ht 66.0 in | Wt 161.0 lb

## 2014-12-09 DIAGNOSIS — Z86010 Personal history of colonic polyps: Secondary | ICD-10-CM | POA: Diagnosis not present

## 2014-12-09 MED ORDER — SODIUM CHLORIDE 0.9 % IV SOLN: 500.0000 mL | INTRAVENOUS | Status: DC

## 2014-12-09 NOTE — Progress Notes (Signed)
Report to PACU, RN, vss, BBS= Clear.  

## 2014-12-09 NOTE — Patient Instructions (Signed)
YOU HAD AN ENDOSCOPIC PROCEDURE TODAY AT [facility]:   Refer to the procedure report that was given to you for any specific questions about what was found during the examination.  If the procedure report does not answer your questions, please call your gastroenterologist to clarify.  If you requested that your care partner not be given the details of your procedure findings, then the procedure report has been included in a sealed envelope for you to review at your convenience later.  YOU SHOULD EXPECT: Some feelings of bloating in the abdomen. Passage of more gas than usual.  Walking can help get rid of the air that was put into your GI tract during the procedure and reduce the bloating. If you had a lower endoscopy (such as a colonoscopy or flexible sigmoidoscopy) you may notice spotting of blood in your stool or on the toilet paper. If you underwent a bowel prep for your procedure, you may not have a normal bowel movement for a few days.  Please Note:  You might notice some irritation and congestion in your nose or some drainage.  This is from the oxygen used during your procedure.  There is no need for concern and it should clear up in a day or so.  SYMPTOMS TO REPORT IMMEDIATELY:   Following lower endoscopy (colonoscopy or flexible sigmoidoscopy):  Excessive amounts of blood in the stool  Significant tenderness or worsening of abdominal pains  Swelling of the abdomen that is new, acute  Fever of 100F or higher  For urgent or emergent issues, a gastroenterologist can be reached at any hour by calling [phone].   DIET: Your first meal following the procedure should be a small meal and then it is ok to progress to your normal diet. Heavy or fried foods are harder to digest and may make you feel nauseous or bloated.  Likewise, meals heavy in dairy and vegetables can increase bloating.  Drink plenty of fluids but you should avoid alcoholic beverages for 24  hours.  ACTIVITY:  You should plan to take it easy for the rest of today and you should NOT DRIVE or use heavy machinery until tomorrow (because of the sedation medicines used during the test).    FOLLOW UP: Our staff will call the number listed on your records the next business day following your procedure to check on you and address any questions or concerns that you may have regarding the information given to you following your procedure. If we do not reach you, we will leave a message.  However, if you are feeling well and you are not experiencing any problems, there is no need to return our call.  We will assume that you have returned to your regular daily activities without incident.  If any biopsies were taken you will be contacted by phone or by letter within the next 1-3 weeks.  Please call us at [phone] if you have not heard about the biopsies in 3 weeks.   SIGNATURES/CONFIDENTIALITY: You and/or your care partner have signed paperwork which will be entered into your electronic medical record.  These signatures attest to the fact that that the information above on your After Visit Summary has been reviewed and is understood.  Full responsibility of the confidentiality of this discharge information lies with you and/or your care-partner.  Please read over handouts about diverticulosis, hemorrhoids and high fiber diets  Continue your normal medications

## 2014-12-09 NOTE — Op Note (Signed)
[facility]  [address] [place], [id number]   COLONOSCOPY PROCEDURE REPORT  PATIENT: [name], Rhonda  MR#: [id number] BIRTHDATE: 17-Oct-1932 , 82  yrs. old GENDER: female ENDOSCOPIST: Ladene Artist, MD, [facility] PROCEDURE DATE:  12/09/2014 PROCEDURE:   Colonoscopy, surveillance and Colonoscopy with snare polypectomy First Screening Colonoscopy - Avg.  risk and is 50 yrs.  old or older - No.  Prior Negative Screening - Now for repeat screening. N/A  History of Adenoma - Now for follow-up colonoscopy & has been > or = to 3 yrs.  Yes hx of adenoma.  Has been 3 or more years since last colonoscopy.  Polyps removed today? No Recommend repeat exam, <10 yrs? No ASA CLASS:   Class II INDICATIONS:Surveillance due to prior colonic neoplasia and PH Colon Adenoma. MEDICATIONS: Monitored anesthesia care and Propofol 200 mg IV DESCRIPTION OF PROCEDURE:   After the risks benefits and alternatives of the procedure were thoroughly explained, informed consent was obtained.  The digital rectal exam revealed no abnormalities of the rectum.   The LB PCF Q180 [id number]  endoscope was introduced through the anus and advanced to the cecum, which was identified by both the appendix and ileocecal valve. No adverse events experienced with a tortuous colon.   The quality of the prep was good.  (Suprep was used)  The instrument was then slowly withdrawn as the colon was fully examined. Estimated blood loss is zero unless otherwise noted in this procedure report.    COLON FINDINGS: There was moderate diverticulosis noted in the sigmoid colon and descending colon with associated luminal narrowing, muscular hypertrophy and tortuosity.   The examination was otherwise normal.  Retroflexed views revealed internal Grade I hemorrhoids. The time to cecum = 4.4 Withdrawal time = 11.4   The scope was withdrawn and the procedure completed. COMPLICATIONS: There were no immediate  complications.  ENDOSCOPIC IMPRESSION: 1.   Moderate diverticulosis in the sigmoid colon and descending colon 2.   Grade l internal hemorrhoids  RECOMMENDATIONS: 1.  High fiber diet with liberal fluid intake. 2.  Given your age, you will not need another colonoscopy for colon cancer screening or polyp surveillance.  These types of tests usually stop around the age 62.  eSigned:  Ladene Artist, MD, [facility] 12/09/2014 9:27 AM   cc: Dione Housekeeper, MD

## 2014-12-10 ENCOUNTER — Telehealth: Payer: Self-pay | Admitting: *Deleted

## 2014-12-10 NOTE — Telephone Encounter (Signed)
  Follow up Call-  Call back number 12/09/2014  Post procedure Call Back phone  # [phone]  Permission to leave phone message Yes     Patient questions:  Do you have a fever, pain , or abdominal swelling? No. Pain Score  0 *  Have you tolerated food without any problems? Yes.    Have you been able to return to your normal activities? Yes.    Do you have any questions about your discharge instructions: Diet   No. Medications  No. Follow up visit  No.  Do you have questions or concerns about your Care? No.  Actions: * If pain score is 4 or above: No action needed, pain <4.

## 2015-03-23 ENCOUNTER — Ambulatory Visit (INDEPENDENT_AMBULATORY_CARE_PROVIDER_SITE_OTHER): Payer: Medicare Other

## 2015-03-23 DIAGNOSIS — Z23 Encounter for immunization: Secondary | ICD-10-CM | POA: Diagnosis not present

## 2015-05-25 DIAGNOSIS — I4891 Unspecified atrial fibrillation: Secondary | ICD-10-CM

## 2015-05-25 HISTORY — DX: Unspecified atrial fibrillation: I48.91

## 2015-06-17 ENCOUNTER — Encounter (HOSPITAL_COMMUNITY): Payer: Self-pay

## 2015-06-17 ENCOUNTER — Emergency Department (HOSPITAL_COMMUNITY)
Admission: EM | Admit: 2015-06-17 | Discharge: 2015-06-17 | Disposition: A | Discharge status: Home / Self Care | Payer: Medicare Other | Attending: Emergency Medicine | Admitting: Emergency Medicine

## 2015-06-17 ENCOUNTER — Emergency Department (HOSPITAL_COMMUNITY): Payer: Medicare Other

## 2015-06-17 DIAGNOSIS — I4891 Unspecified atrial fibrillation: Secondary | ICD-10-CM | POA: Diagnosis not present

## 2015-06-17 DIAGNOSIS — Z7982 Long term (current) use of aspirin: Secondary | ICD-10-CM | POA: Diagnosis not present

## 2015-06-17 DIAGNOSIS — R000 Tachycardia, unspecified: Secondary | ICD-10-CM | POA: Diagnosis present

## 2015-06-17 LAB — URINALYSIS, ROUTINE W REFLEX MICROSCOPIC
Bilirubin Urine: NEGATIVE
GLUCOSE, UA: NEGATIVE mg/dL
Ketones, ur: NEGATIVE mg/dL
Nitrite: NEGATIVE
PH: 7 (ref 5.0–8.0)
Protein, ur: NEGATIVE mg/dL
Specific Gravity, Urine: 1.005 (ref 1.005–1.030)

## 2015-06-17 LAB — I-STAT TROPONIN, ED: Troponin i, poc: 0.01 ng/mL (ref 0.00–0.08)

## 2015-06-17 LAB — COMPREHENSIVE METABOLIC PANEL
ALBUMIN: 3.5 g/dL (ref 3.5–5.0)
ALK PHOS: 96 U/L (ref 38–126)
ALT: 14 U/L (ref 14–54)
ANION GAP: 11 (ref 5–15)
AST: 20 U/L (ref 15–41)
BILIRUBIN TOTAL: 0.5 mg/dL (ref 0.3–1.2)
BUN: 21 mg/dL — AB (ref 6–20)
CALCIUM: 8.8 mg/dL — AB (ref 8.9–10.3)
CO2: 24 mmol/L (ref 22–32)
Chloride: 108 mmol/L (ref 101–111)
Creatinine, Ser: 0.93 mg/dL (ref 0.44–1.00)
GFR calc Af Amer: >60 mL/min (ref >60)
GFR calc non Af Amer: 57 mL/min — ABNORMAL LOW (ref >60)
GLUCOSE: 120 mg/dL — AB (ref 65–99)
Potassium: 3.6 mmol/L (ref 3.5–5.1)
Sodium: 143 mmol/L (ref 135–145)
TOTAL PROTEIN: 6.2 g/dL — AB (ref 6.5–8.1)

## 2015-06-17 LAB — CBC WITH DIFFERENTIAL/PLATELET
Basophils Absolute: 0.1 10*3/uL (ref 0.0–0.1)
Basophils Relative: 1 %
EOS PCT: 2 %
Eosinophils Absolute: 0.2 10*3/uL (ref 0.0–0.7)
HCT: 44.6 % (ref 36.0–46.0)
Hemoglobin: 14.6 g/dL (ref 12.0–15.0)
LYMPHS ABS: 2.7 10*3/uL (ref 0.7–4.0)
LYMPHS PCT: 34 %
MCH: 29.6 pg (ref 26.0–34.0)
MCHC: 32.7 g/dL (ref 30.0–36.0)
MCV: 90.3 fL (ref 78.0–100.0)
MONOS PCT: 8 %
Monocytes Absolute: 0.7 10*3/uL (ref 0.1–1.0)
Neutro Abs: 4.3 10*3/uL (ref 1.7–7.7)
Neutrophils Relative %: 55 %
PLATELETS: 239 10*3/uL (ref 150–400)
RBC: 4.94 MIL/uL (ref 3.87–5.11)
RDW: 12.9 % (ref 11.5–15.5)
WBC: 7.9 10*3/uL (ref 4.0–10.5)

## 2015-06-17 LAB — URINE MICROSCOPIC-ADD ON

## 2015-06-17 LAB — TSH: TSH: 5.881 u[IU]/mL — ABNORMAL HIGH (ref 0.350–4.500)

## 2015-06-17 MED ORDER — FENTANYL CITRATE (PF) 100 MCG/2ML IJ SOLN
INTRAMUSCULAR | Status: AC
  Administered 2015-06-17: 25 ug
  Filled 2015-06-17: qty 2

## 2015-06-17 MED ORDER — MIDAZOLAM HCL 2 MG/2ML IJ SOLN
INTRAMUSCULAR | Status: AC
  Filled 2015-06-17: qty 2

## 2015-06-17 MED ORDER — FENTANYL CITRATE (PF) 100 MCG/2ML IJ SOLN
50.0000 ug | Freq: Once | INTRAMUSCULAR | Status: AC
  Administered 2015-06-17: 50 ug via INTRAVENOUS

## 2015-06-17 MED ORDER — MIDAZOLAM HCL 5 MG/5ML IJ SOLN
4.0000 mg | Freq: Once | INTRAMUSCULAR | Status: AC
  Administered 2015-06-17: 4 mg via INTRAVENOUS

## 2015-06-17 MED ORDER — FENTANYL CITRATE (PF) 100 MCG/2ML IJ SOLN
INTRAMUSCULAR | Status: AC
  Filled 2015-06-17: qty 2

## 2015-06-17 MED ORDER — DILTIAZEM HCL ER COATED BEADS 180 MG PO CP24: 180.0000 mg | ORAL_CAPSULE | Freq: Every day | ORAL | Status: DC

## 2015-06-17 MED ORDER — MIDAZOLAM HCL 2 MG/2ML IJ SOLN
INTRAMUSCULAR | Status: AC
  Administered 2015-06-17: 3 mg
  Filled 2015-06-17: qty 4

## 2015-06-17 MED ORDER — DILTIAZEM HCL ER COATED BEADS 180 MG PO CP24
180.0000 mg | ORAL_CAPSULE | Freq: Every day | ORAL | Status: DC
  Administered 2015-06-17: 180 mg via ORAL
  Filled 2015-06-17: qty 1

## 2015-06-17 MED ORDER — APIXABAN 5 MG PO TABS
5.0000 mg | ORAL_TABLET | Freq: Two times a day (BID) | ORAL | Status: DC
Start: 2015-06-17 — End: 2015-07-13

## 2015-06-17 MED ORDER — MIDAZOLAM HCL 2 MG/2ML IJ SOLN
INTRAMUSCULAR | Status: AC
  Filled 2015-06-17: qty 4

## 2015-06-17 MED ORDER — DEXTROSE 5 % IV SOLN: 5.0000 mg/h | Freq: Once | INTRAVENOUS | Status: DC

## 2015-06-17 MED ORDER — FENTANYL CITRATE (PF) 100 MCG/2ML IJ SOLN
INTRAMUSCULAR | Status: AC
  Filled 2015-06-17: qty 4

## 2015-06-17 MED ORDER — APIXABAN 5 MG PO TABS
5.0000 mg | ORAL_TABLET | Freq: Once | ORAL | Status: AC
  Administered 2015-06-17: 5 mg via ORAL
  Filled 2015-06-17: qty 1

## 2015-06-17 MED ORDER — DILTIAZEM HCL 100 MG IV SOLR
5.0000 mg/h | Freq: Once | INTRAVENOUS | Status: DC
  Filled 2015-06-17: qty 100

## 2015-06-17 NOTE — ED Notes (Signed)
Pt cardioverted with 100 joules, not synced per Dr. Lita Mains.

## 2015-06-17 NOTE — ED Notes (Signed)
Pt cardioverted with 200 joules, not synced per Dr. Lita Mains

## 2015-06-17 NOTE — Discharge Instructions (Signed)

## 2015-06-17 NOTE — ED Provider Notes (Signed)
CSN: [id number]     Arrival date & time 06/17/15  0017 History  By signing my name below, I, Evelene Croon, attest that this documentation has been prepared under the direction and in the presence of Julianne Rice, MD . Electronically Signed: Evelene Croon, Scribe. 06/17/2015. 12:41 AM.    Chief Complaint  Patient presents with  . Tachycardia    The history is provided by the patient and the EMS personnel. No language interpreter was used.     HPI Comments:  Rhonda [name] is a 82 y.o. female brought in by ambulance, who presents to the Emergency Department complaining of constant palpitations, onset~ 2230 last night (06/16/15); states she was laying in bed at onset. Per EMS pt's HR has been between 190-208 since their arrival ~2300. Pt reports h/o similar episodes but notes they have always resolved on their own after about 1 hour. Pt reports associated lightheadedness.  She denies SOB, CP, nausea, vomiting, fever, and chills. Pt was given adenosine x 2 and cardizem 10 mg x 2 en route without improvement; last dose of cardizem was ~ 0005. EMS also notes BP of 95/55 taken just PTA. Pt has no other significant PMHx; states she takes baby ASA daily.    Past Medical History  Diagnosis Date  . Syncope 04/2014   Past Surgical History  Procedure Laterality Date  . Total knee arthroplasty Left   . Knee arthrocentesis    . Vaginal hysterectomy    . Shoulder surgery Left   . Colonoscopy  2011  . Polypectomy  11-01-2009   Family History  Problem Relation Age of Onset  . Heart failure Mother   . Hypertension Sister   . Hypertension Brother    Social History  Substance Use Topics  . Smoking status: Never Smoker   . Smokeless tobacco: Never Used  . Alcohol Use: No   OB History    No data available     Review of Systems  Constitutional: Negative for fever and chills.  Respiratory: Negative for chest tightness and shortness of breath.   Cardiovascular: Positive for palpitations.  Negative for chest pain.  Gastrointestinal: Negative for nausea, vomiting, abdominal pain and diarrhea.  Musculoskeletal: Negative for back pain, neck pain and neck stiffness.  Skin: Negative for rash and wound.  Neurological: Positive for light-headedness. Negative for dizziness, syncope, weakness, numbness and headaches.  Psychiatric/Behavioral: The patient is not nervous/anxious.   All other systems reviewed and are negative.  Allergies  Review of patient's allergies indicates no known allergies.  Home Medications   Prior to Admission medications   Medication Sig Start Date End Date Taking? Authorizing Provider  aspirin EC 81 MG tablet Take 81 mg by mouth daily.   Yes Historical Provider, MD  apixaban (ELIQUIS) 5 MG TABS tablet Take 1 tablet (5 mg total) by mouth 2 (two) times daily. 06/17/15   Julianne Rice, MD  diltiazem (CARDIZEM CD) 180 MG 24 hr capsule Take 1 capsule (180 mg total) by mouth daily. 06/18/15   Julianne Rice, MD   BP 119/71 mmHg  Pulse 67  Temp(Src) 97.7 F (36.5 C) (Oral)  Resp 20  Ht 5\' 5"  (1.651 m)  Wt 160 lb (72.576 kg)  BMI 26.63 kg/m2  SpO2 99% Physical Exam  Constitutional: She is oriented to person, place, and time. She appears well-developed and well-nourished. No distress.  HENT:  Head: Normocephalic and atraumatic.  Mouth/Throat: Oropharynx is clear and moist. No oropharyngeal exudate.  Eyes: EOM are normal. Pupils are  equal, round, and reactive to light.  Neck: Normal range of motion. Neck supple. No JVD present. No thyromegaly present.  Cardiovascular:  Tachycardia. Irregularly irregular  Pulmonary/Chest: Effort normal and breath sounds normal. No respiratory distress. She has no wheezes. She has no rales.  Abdominal: Soft. Bowel sounds are normal. She exhibits no distension and no mass. There is no tenderness. There is no guarding.  Musculoskeletal: Normal range of motion. She exhibits no edema or tenderness.  No lower extremity swelling  or pain. Distal pulses irregular but equal.   Neurological: She is alert and oriented to person, place, and time.  5/5 motor in all extremities. Sensation is fully intact.  Skin: Skin is warm and dry. No rash noted. No erythema.  Psychiatric: She has a normal mood and affect. Her behavior is normal.  Nursing note and vitals reviewed.   ED Course  .Cardioversion Date/Time: 06/17/2015 12:33 AM Performed by: Julianne Rice Authorized by: Julianne Rice Consent: Written consent obtained. Risks and benefits: risks, benefits and alternatives were discussed Consent given by: patient Patient identity confirmed: verbally with patient Time out: Immediately prior to procedure a "time out" was called to verify the correct patient, procedure, equipment, support staff and site/side marked as required. Patient sedated: yes Sedation type: anxiolysis Sedatives: fentanyl and midazolam Sedation start date/time: 06/17/2015 12:33 AM Sedation end date/time: 06/17/2015 12:45 AM Cardioversion basis: emergent Pre-procedure rhythm: atrial fibrillation Patient position: patient was placed in a supine position Chest area: chest area exposed Electrodes: pads Electrodes placed: anterior-posterior Number of attempts: 2 Attempt 1 mode: asynchronous Attempt 1 waveform: biphasic Attempt 1 shock (in Joules): 100 Attempt 1 outcome: conversion to normal sinus rhythm Attempt 2 mode: asynchronous Attempt 2 waveform: biphasic Attempt 2 shock (in Joules): 200 Attempt 2 outcome: conversion to normal sinus rhythm Post-procedure rhythm: atrial fibrillation Complications: no complications Patient tolerance: Patient tolerated the procedure well with no immediate complications  .Cardioversion Date/Time: 06/17/2015 2:15 AM Performed by: Julianne Rice Authorized by: Julianne Rice Consent: Written consent obtained. Patient identity confirmed: verbally with patient Time out: Immediately prior to procedure a "time  out" was called to verify the correct patient, procedure, equipment, support staff and site/side marked as required. Patient sedated: yes Sedation type: anxiolysis Sedatives: fentanyl and midazolam Sedation start date/time: 06/17/2015 2:15 AM Sedation end date/time: 06/17/2015 2:20 AM Cardioversion basis: emergent Pre-procedure rhythm: atrial fibrillation Patient position: patient was placed in a supine position Chest area: chest area exposed Electrodes: pads Electrodes placed: anterior-posterior Number of attempts: 1. Post-procedure rhythm: normal sinus rhythm Complications: no complications Patient tolerance: Patient tolerated the procedure well with no immediate complications    DIAGNOSTIC STUDIES:  Oxygen Saturation is 97% on 2L [place], normal by my interpretation.    COORDINATION OF CARE:  12:26 AM Will electrically cardiovert.  Discussed treatment plan with pt at bedside and pt agreed to plan.  Labs Review Labs Reviewed  COMPREHENSIVE METABOLIC PANEL - Abnormal; Notable for the following:    Glucose, Bld 120 (*)    BUN 21 (*)    Calcium 8.8 (*)    Total Protein 6.2 (*)    GFR calc non Af Amer 57 (*)    All other components within normal limits  URINALYSIS, ROUTINE W REFLEX MICROSCOPIC (NOT AT [facility]) - Abnormal; Notable for the following:    Hgb urine dipstick SMALL (*)    Leukocytes, UA TRACE (*)    All other components within normal limits  TSH - Abnormal; Notable for the following:    TSH  5.881 (*)    All other components within normal limits  URINE MICROSCOPIC-ADD ON - Abnormal; Notable for the following:    Squamous Epithelial / LPF 0-5 (*)    Bacteria, UA RARE (*)    All other components within normal limits  CBC WITH DIFFERENTIAL/PLATELET  Randolm Idol, ED    Imaging Review Dg Chest Portable 1 View  06/17/2015  CLINICAL DATA:  Chest discomfort, shortness of breath, palpitations. SVT. EXAM: PORTABLE CHEST 1 VIEW COMPARISON:  02/13/2008 FINDINGS: Lung  volumes are low. Overlying moderate ring device over the left hemithorax partially obscures evaluation. Cardiomediastinal contours are unchanged, heart at the upper limits of normal in size. Tortuous thoracic aorta again seen. Diffuse interstitial opacities, may reflect pulmonary edema. Minimal left basilar atelectasis. No confluent pneumonia. No large pleural effusion. No pneumothorax. Bones appear under mineralized. IMPRESSION: Low lung volumes. Borderline cardiomegaly, suggestion of pulmonary edema. Electronically Signed   By: Jeb Levering M.D.   On: 06/17/2015 01:37   I have personally reviewed and evaluated these images and lab results as part of my medical decision-making.   EKG Interpretation   Date/Time:  Friday June 17 2015 00:34:18 EST Ventricular Rate:  96 PR Interval:  157 QRS Duration: 91 QT Interval:  383 QTC Calculation: 484 R Axis:   -10 Text Interpretation:  Sinus rhythm Ventricular premature complex Confirmed  by Lita Mains  MD, Martavius Lusty ([id number]) on 06/17/2015 4:41:29 AM       CRITICAL CARE Performed by: Julianne Rice, MD Total critical care time: 60 minutes Critical care time was exclusive of separately billable procedures and treating other patients. Critical care was necessary to treat or prevent imminent or life-threatening deterioration. Critical care was time spent personally by me on the following activities: development of treatment plan with patient and/or surrogate as well as nursing, discussions with consultants, evaluation of patient's response to treatment, examination of patient, obtaining history from patient or surrogate, ordering and performing treatments and interventions, ordering and review of laboratory studies, ordering and review of radiographic studies, pulse oximetry and re-evaluation of patient's condition.   MDM   Final diagnoses:  Atrial fibrillation with RVR ([place])   I personally performed the services described in this documentation,  which was scribed in my presence. The recorded information has been reviewed and is accurate.   Discussed with cardiology on call. Recommend starting diltiazem drip and reattempting electrical cardioversion.  Heart rate controlled with diltiazem drip. Cardioversion attempted once more with conversion to normal sinus rhythm. Patient tolerated well. Had discussion with patient regarding risks and benefits of anticoagulants. Patient is able to steadily ambulate and takes care of all of her activities of daily living. She is low fall risk. She understands the risks of anticoagulants and agrees to proceed.  Patient requesting Eliquis instead of Xeralto. Given 5 mg dose per pharmacy in emergency department. Cardiology also recommended starting on Cardizem CD 180 mg daily. Given first dose in the emergency department. Patient understands the need to follow-up with her cardiologist. She will call to make an appointment. She does need to return immediately for any worsening palpitations, chest pain, difficulty breathing or for any concerns.  Julianne Rice, MD 06/17/15 [phone]

## 2015-06-17 NOTE — ED Notes (Signed)
Pt here for tachycardia by ems, pt hr with ems was 190-200, given 10-03-10 of adenosine with no change in HR, pt complains of having to use restroom on arrival.pt was also given cardiazem enroute with no change in hr. bp with ems was 95/55, 196 hr

## 2015-06-17 NOTE — ED Notes (Signed)
cardiazem decreased to 5 and pt now cardioverted at 200joules, pt now in a sinus rhythm at rate of 74. Resting.

## 2015-06-17 NOTE — ED Notes (Signed)
HR slowed to 96 and NSR on the monitor at this time. Pt calm at this time.

## 2015-06-17 NOTE — ED Notes (Signed)
cardiazem decreased due to blood pressure.

## 2015-06-17 NOTE — ED Notes (Signed)
Family at bedside. 

## 2015-06-17 NOTE — ED Notes (Signed)
Consent signed for cardioversion with anxiolytic

## 2015-06-17 NOTE — ED Notes (Signed)
Pt now back in Afib with RVR at rate of 170's.

## 2015-07-13 ENCOUNTER — Ambulatory Visit (INDEPENDENT_AMBULATORY_CARE_PROVIDER_SITE_OTHER): Payer: Medicare Other | Admitting: Cardiology

## 2015-07-13 ENCOUNTER — Encounter: Payer: Self-pay | Admitting: Cardiology

## 2015-07-13 ENCOUNTER — Ambulatory Visit: Payer: Medicare Other | Admitting: Cardiology

## 2015-07-13 VITALS — BP 141/81 | HR 71 | Wt 160.0 lb

## 2015-07-13 DIAGNOSIS — I481 Persistent atrial fibrillation: Secondary | ICD-10-CM | POA: Diagnosis not present

## 2015-07-13 DIAGNOSIS — I4819 Other persistent atrial fibrillation: Secondary | ICD-10-CM

## 2015-07-13 DIAGNOSIS — I4891 Unspecified atrial fibrillation: Secondary | ICD-10-CM | POA: Insufficient documentation

## 2015-07-13 MED ORDER — DILTIAZEM HCL ER COATED BEADS 180 MG PO CP24: 180.0000 mg | ORAL_CAPSULE | Freq: Every day | ORAL | Status: DC

## 2015-07-13 MED ORDER — APIXABAN 5 MG PO TABS: 5.0000 mg | ORAL_TABLET | Freq: Two times a day (BID) | ORAL | Status: DC

## 2015-07-13 NOTE — Patient Instructions (Signed)
Medication Instructions:  The current medical regimen is effective;  continue present plan and medications.  Follow-Up: Follow up in 3 months with Dr Hochrein.  If you need a refill on your cardiac medications before your next appointment, please call your pharmacy.  Thank you for choosing [facility]!!     

## 2015-07-13 NOTE — Progress Notes (Signed)
HPI The patient presents for evaluation of atrial fibrillation.  I saw her previously for syncope which was felt to be vagal. However, she now presents with an episode of atrial fibrillation. This was on February 24. She was transported via EMS to [facility]. I have reviewed these records. She required cardioversion and was sent home on Cardizem and helpless. She said she woke up and felt the palpitations. She never had chest pain. She never had presyncope or syncope. She's not had any shortness of breath. This was not like her previous syncopal episode. Since then she has felt well. She's had no further symptoms. She's not had any chest pressure, neck or arm discomfort. She's had no weight gain or edema.  No Known Allergies  Prior to Admission medications   Medication Sig Start Date End Date Taking? Authorizing Provider  apixaban (ELIQUIS) 5 MG TABS tablet Take 1 tablet (5 mg total) by mouth 2 (two) times daily. 06/17/15  Yes Julianne Rice, MD  diltiazem (CARDIZEM CD) 180 MG 24 hr capsule Take 1 capsule (180 mg total) by mouth daily. 06/18/15  Yes Julianne Rice, MD  aspirin EC 81 MG tablet Take 81 mg by mouth daily. Reported on 07/13/2015    Historical Provider, MD      PMH: None  PSH:  Vaginal hysterectomy, oophorectomy, total knee replacement left, shoulder surgery left, multiple knee arthroscopies   Family History  Problem Relation Age of Onset  . Heart failure Mother   . Hypertension Sister   . Hypertension Brother     Social History   Social History  . Marital Status: Married    Spouse Name: N/A  . Number of Children: 1  . Years of Education: N/A   Occupational History  . Not on file.   Social History Main Topics  . Smoking status: Never Smoker   . Smokeless tobacco: Never Used  . Alcohol Use: No  . Drug Use: No  . Sexual Activity: Not on file   Other Topics Concern  . Not on file   Social History Narrative   Lives with husband.      ROS:  As stated in the HPI  and negative for all other systems.  PHYSICAL EXAM BP 141/81 mmHg  Pulse 71  Wt 160 lb (72.576 kg)  GENERAL:  Well appearing HEENT:  Pupils equal round and reactive, fundi not visualized, oral mucosa unremarkable NECK:  No jugular venous distention, waveform within normal limits, carotid upstroke brisk and symmetric, no bruits, no thyromegaly LYMPHATICS:  No cervical, inguinal adenopathy LUNGS:  Clear to auscultation bilaterally BACK:  No CVA tenderness CHEST:  Unremarkable HEART:  PMI not displaced or sustained,S1 and S2 within normal limits, no S3, no S4, no clicks, no rubs, no murmurs ABD:  Flat, positive bowel sounds normal in frequency in pitch, no bruits, no rebound, no guarding, no midline pulsatile mass, no hepatomegaly, no splenomegaly EXT:  2 plus pulses throughout, no edema, no cyanosis no clubbing SKIN:  No rashes no nodules    EKG:  Sinus rhythm, 71, left axis deviation, mild QTC prolongation, no T wave changes.  07/13/2015   ASSESSMENT AND PLAN  ATRIAL FIB:   As noted the patient had persistent atrial fibrillation requiring cardioversion. She's tolerating Cardizem and anticoagulation. I have reviewed the hospital records.  Ms. ALINNA BURKHOLDER has a CHA2DS2 - VASc score of 3 with a risk of stroke of 3%.  TSH:  She had mildly abnormal TSH.  If this is not  followed up by Sherrie Mustache, MD in the future I will probably check a T3-T4.  SYNCOPE:  I suspect this was a vagal episode. She hasn't had further episodes of this.  This event was not like the atrial fibrillation that she experienced. I do not think these 2 are related.  QT PROLONGED:  This is a mild abnormality. Avoid QT prolonging drugs in the future but otherwise would not suggest that this was related. No further workup is suggested.

## 2015-09-22 ENCOUNTER — Encounter: Payer: Self-pay | Admitting: Cardiology

## 2015-09-27 ENCOUNTER — Encounter: Payer: Self-pay | Admitting: Cardiology

## 2015-09-27 ENCOUNTER — Ambulatory Visit (INDEPENDENT_AMBULATORY_CARE_PROVIDER_SITE_OTHER): Payer: Medicare Other | Admitting: Cardiology

## 2015-09-27 ENCOUNTER — Ambulatory Visit (INDEPENDENT_AMBULATORY_CARE_PROVIDER_SITE_OTHER): Payer: Medicare Other

## 2015-09-27 VITALS — BP 144/88 | HR 70 | Ht 65.0 in | Wt 160.0 lb

## 2015-09-27 DIAGNOSIS — R55 Syncope and collapse: Secondary | ICD-10-CM

## 2015-09-27 NOTE — Progress Notes (Signed)
HPI The patient presents for evaluation of atrial fibrillation.  I saw her previously for syncope which was felt to be vagal. However, she presented at the last visit with an episode of atrial fibrillation. This was on February 24. She was transported via EMS to [facility].   She required cardioversion and was sent home on Cardizem and Eliquis.   I follow up and she had been doing well.  However, since my last appt she has had more presyncope.  This happened in the morning as have other episodes.  It seems to happen only when she is up on her feet.  She notices it before eating.  She will get sweaty and feel like things are going grey.  She has to lie down to avoid passing out. However, she's not feeling her heart racing at that time. She's not having any chest pressure, neck or arm discomfort.  No Known Allergies  Prior to Admission medications   Medication Sig Start Date End Date Taking? Authorizing Provider  apixaban (ELIQUIS) 5 MG TABS tablet Take 1 tablet (5 mg total) by mouth 2 (two) times daily. 06/17/15  Yes Julianne Rice, MD  diltiazem (CARDIZEM CD) 180 MG 24 hr capsule Take 1 capsule (180 mg total) by mouth daily. 06/18/15  Yes Julianne Rice, MD  aspirin EC 81 MG tablet Take 81 mg by mouth daily. Reported on 07/13/2015    Historical Provider, MD      PMH: None  PSH:  Vaginal hysterectomy, oophorectomy, total knee replacement left, shoulder surgery left, multiple knee arthroscopies   ROS:  As stated in the HPI and negative for all other systems.   PHYSICAL EXAM BP 144/88 mmHg  Pulse 70  Ht 5\' 5"  (1.651 m)  Wt 160 lb (72.576 kg)  BMI 26.63 kg/m2  GENERAL:  Well appearing HEENT:  Pupils equal round and reactive, fundi not visualized, oral mucosa unremarkable NECK:  No jugular venous distention, waveform within normal limits, carotid upstroke brisk and symmetric, no bruits, no thyromegaly LYMPHATICS:  No cervical, inguinal adenopathy LUNGS:  Clear to auscultation  bilaterally BACK:  No CVA tenderness CHEST:  Unremarkable HEART:  PMI not displaced or sustained,S1 and S2 within normal limits, no S3, no S4, no clicks, no rubs, no murmurs ABD:  Flat, positive bowel sounds normal in frequency in pitch, no bruits, no rebound, no guarding, no midline pulsatile mass, no hepatomegaly, no splenomegaly EXT:  2 plus pulses throughout, no edema, no cyanosis no clubbing SKIN:  No rashes no nodules    EKG:  Sinus rhythm, 70, left axis deviation, mild QTC prolongation, no T wave changes.  09/27/2015   ASSESSMENT AND PLAN  ATRIAL FIB:  .  [name] has a CHA2DS2 - VASc score of 3 with a risk of stroke of 3%.  For now she will continue with the meds as listed.   TSH:  She had mildly abnormal TSH.  If this is not followed up by Sherrie Mustache, MD in the future I will probably check a T3-T4 the next time I draw labs.     SYNCOPE:  I suspect this was a vagal episode. She hasn't had further episodes of this. I have asked her to check her BP when this is happening.  She has no way of checking her blood sugar.  I will apply an event monitor.  Rhonda [name] will need a 21 day event monitor.  The patients symptoms necessitate an event monitor.  The symptoms are too  infrequent to be identified on a Holter monitor.    QT PROLONGED:  This is a mild abnormality. Avoid QT prolonging drugs in the future.  I will follow for rhythm problems as above.

## 2015-09-27 NOTE — Patient Instructions (Addendum)
Medication Instructions:  Continue current medications  Labwork: NONE  Testing/Procedures: Your physician has recommended that you wear an event monitor for 21 days. Event monitors are medical devices that record the heart's electrical activity. Doctors most often [place] these monitors to diagnose arrhythmias. Arrhythmias are problems with the speed or rhythm of the heartbeat. The monitor is a small, portable device. You can wear one while you do your normal daily activities. This is usually used to diagnose what is causing palpitations/syncope (passing out).  Follow-Up: Your physician recommends that you schedule a follow-up appointment in: After Monitor   Any Other Special Instructions Will Be Listed Below (If Applicable).  If you need a refill on your cardiac medications before your next appointment, please call your pharmacy.

## 2015-10-17 DIAGNOSIS — R55 Syncope and collapse: Secondary | ICD-10-CM | POA: Diagnosis not present

## 2015-10-19 ENCOUNTER — Ambulatory Visit: Payer: Medicare Other | Admitting: Cardiology

## 2015-11-15 NOTE — Progress Notes (Signed)
   HPI The patient presents for evaluation of atrial fibrillation.  I saw her previously for syncope which was felt to be vagal. She has also had atrial fib requiring cardioversion.  At the last visit in March she was having some presyncope.  The etiology of this was not clear.  She wore an event monitor that demonstrated no arrhythmias.  Since I last saw her she has done well.  The patient denies any new symptoms such as chest discomfort, neck or arm discomfort. There has been no new shortness of breath, PND or orthopnea. There have been no reported palpitations, presyncope or syncope.  She has had no further syncope.  Of note she developed hives and itching recently and started a prednisone taper today.   No Known Allergies  Prior to Admission medications   Medication Sig Start Date End Date Taking? Authorizing Provider  apixaban (ELIQUIS) 5 MG TABS tablet Take 1 tablet (5 mg total) by mouth 2 (two) times daily. 07/13/15  Yes [name], MD  diltiazem (CARDIZEM CD) 180 MG 24 hr capsule Take 1 capsule (180 mg total) by mouth daily. 07/13/15  Yes [name], MD  predniSONE (DELTASONE) 10 MG tablet 6 daily x 1 day, 5 daily x 1 day, 4 daily x 1 day, 3 daily x 1 day, 2 daily x 1 day,1 daily x 1 day then off 11/16/15 11/22/15 Yes Historical Provider, MD    PMH: Atrial fib  PSH:  Vaginal hysterectomy, oophorectomy, total knee replacement left, shoulder surgery left, multiple knee arthroscopies   ROS:  As stated in the HPI and negative for all other systems.   PHYSICAL EXAM BP 126/80   Pulse 88   Ht 5\' 5"  (1.651 m)   Wt 161 lb (73 kg)   BMI 26.79 kg/m   GENERAL:  Well appearing HEENT:  Pupils equal round and reactive, fundi not visualized, oral mucosa unremarkable NECK:  No jugular venous distention, waveform within normal limits, carotid upstroke brisk and symmetric, no bruits, no thyromegaly LYMPHATICS:  No cervical, inguinal adenopathy LUNGS:  Clear to auscultation  bilaterally BACK:  No CVA tenderness CHEST:  Unremarkable HEART:  PMI not displaced or sustained,S1 and S2 within normal limits, no S3, no S4, no clicks, no rubs, no murmurs ABD:  Flat, positive bowel sounds normal in frequency in pitch, no bruits, no rebound, no guarding, no midline pulsatile mass, no hepatomegaly, no splenomegaly EXT:  2 plus pulses throughout, no edema, no cyanosis no clubbing SKIN:  No rashes no nodules   ASSESSMENT AND PLAN  ATRIAL FIB:  .  Ms. [name] has a CHA2DS2 - VASc score of 3 with a risk of stroke of 3%.  She will continue with the meds as listed.    SYNCOPE:  I suspect this was a vagal episode. She hasn't had further episodes of this. No further evaluation or change in therapy is indicated.   QT PROLONGED:  This is a mild abnormality. Avoid QT prolonging drugs in the future.

## 2015-11-16 ENCOUNTER — Ambulatory Visit (INDEPENDENT_AMBULATORY_CARE_PROVIDER_SITE_OTHER): Payer: Medicare Other | Admitting: Cardiology

## 2015-11-16 ENCOUNTER — Encounter: Payer: Self-pay | Admitting: Cardiology

## 2015-11-16 VITALS — BP 126/80 | HR 88 | Ht 65.0 in | Wt 161.0 lb

## 2015-11-16 DIAGNOSIS — R55 Syncope and collapse: Secondary | ICD-10-CM

## 2015-11-16 DIAGNOSIS — I4891 Unspecified atrial fibrillation: Secondary | ICD-10-CM

## 2015-11-16 NOTE — Patient Instructions (Signed)
Medication Instructions:  The current medical regimen is effective;  continue present plan and medications.  Follow-Up: Follow up in 1 year with Dr. Hochrein in [place].  You will receive a letter in the mail 2 months before you are due.  Please call us when you receive this letter to schedule your follow up appointment.  If you need a refill on your cardiac medications before your next appointment, please call your pharmacy.  Thank you for choosing [facility]!!     

## 2015-12-19 ENCOUNTER — Other Ambulatory Visit: Payer: Self-pay | Admitting: Family Medicine

## 2015-12-19 DIAGNOSIS — Z1231 Encounter for screening mammogram for malignant neoplasm of breast: Secondary | ICD-10-CM

## 2016-01-02 ENCOUNTER — Ambulatory Visit: Payer: Medicare Other

## 2016-01-06 ENCOUNTER — Ambulatory Visit
Admission: RE | Admit: 2016-01-06 | Discharge: 2016-01-06 | Disposition: A | Discharge status: Home / Self Care | Payer: Medicare Other | Source: Ambulatory Visit | Attending: Family Medicine | Admitting: Family Medicine

## 2016-01-06 DIAGNOSIS — Z1231 Encounter for screening mammogram for malignant neoplasm of breast: Secondary | ICD-10-CM

## 2016-03-19 DIAGNOSIS — Z7901 Long term (current) use of anticoagulants: Secondary | ICD-10-CM | POA: Insufficient documentation

## 2016-05-04 DIAGNOSIS — Z96652 Presence of left artificial knee joint: Secondary | ICD-10-CM | POA: Diagnosis not present

## 2016-05-04 DIAGNOSIS — M25561 Pain in right knee: Secondary | ICD-10-CM | POA: Diagnosis not present

## 2016-05-04 DIAGNOSIS — M1711 Unilateral primary osteoarthritis, right knee: Secondary | ICD-10-CM | POA: Diagnosis not present

## 2016-05-11 ENCOUNTER — Ambulatory Visit: Payer: Medicare Other | Admitting: Family Medicine

## 2016-05-14 ENCOUNTER — Ambulatory Visit (INDEPENDENT_AMBULATORY_CARE_PROVIDER_SITE_OTHER): Payer: PPO

## 2016-05-14 ENCOUNTER — Ambulatory Visit (INDEPENDENT_AMBULATORY_CARE_PROVIDER_SITE_OTHER): Payer: PPO | Admitting: Family Medicine

## 2016-05-14 ENCOUNTER — Other Ambulatory Visit: Payer: Self-pay | Admitting: Family Medicine

## 2016-05-14 ENCOUNTER — Encounter: Payer: Self-pay | Admitting: Family Medicine

## 2016-05-14 VITALS — BP 122/76 | HR 87 | Temp 97.6°F | Ht 65.0 in | Wt 162.4 lb

## 2016-05-14 DIAGNOSIS — Z780 Asymptomatic menopausal state: Secondary | ICD-10-CM

## 2016-05-14 DIAGNOSIS — N3281 Overactive bladder: Secondary | ICD-10-CM | POA: Diagnosis not present

## 2016-05-14 DIAGNOSIS — I481 Persistent atrial fibrillation: Secondary | ICD-10-CM | POA: Diagnosis not present

## 2016-05-14 DIAGNOSIS — I4819 Other persistent atrial fibrillation: Secondary | ICD-10-CM

## 2016-05-14 NOTE — Progress Notes (Signed)
BP 122/76   Pulse 87   Temp 97.6 F (36.4 C) (Oral)   Ht 5\' 5"  (1.651 m)   Wt 162 lb 6.4 oz (73.7 kg)   BMI 27.02 kg/m    Subjective:    Patient ID: Rhonda [name] Alias, female    DOB: 04-20-1933, 83 y.o.   MRN: [id number]  HPI: Rhonda [name] is a 83 y.o. female presenting on 05/14/2016 for Establish Care (changing d/t insurance changes)   HPI A. fib, establish care Patient has been diagnosed with A. fib as of February 2017. She has been on Cardizem and eloquent since that time and has been monitoring and managing it since that time. She denies any further lightheadedness or dizziness or syncope since that first episode when she was initially diagnosed. She denies any chest pain or shortness of breath or palpitations or flutters.  Nocturia  patient has been having frequent nighttime urination which wakes her up and makes it so that she doesn't sleep as well as night. She says she is going about 3 times per night and has some difficulty going back to sleep afterwards. She denies any major urinary issues or urinary frequency that occurred during the day. She denies any incontinence during the day either. She denies any dysuria or hematuria.  Need for bone density screening  Relevant past medical, surgical, family and social history reviewed and updated as indicated. Interim medical history since our last visit reviewed. Allergies and medications reviewed and updated.  Review of Systems  Constitutional: Negative for chills and fever.  Eyes: Negative for redness and visual disturbance.  Respiratory: Negative for chest tightness and shortness of breath.   Cardiovascular: Negative for chest pain and leg swelling.  Gastrointestinal: Negative for abdominal pain.  Genitourinary: Positive for frequency. Negative for decreased urine volume, difficulty urinating, dysuria, hematuria and urgency.  Musculoskeletal: Negative for back pain and gait problem.  Skin: Negative for rash.  Neurological:  Negative for light-headedness and headaches.  Psychiatric/Behavioral: Negative for agitation and behavioral problems.  All other systems reviewed and are negative.   Per HPI unless specifically indicated above  Social History   Social History  . Marital status: Married    Spouse name: N/A  . Number of children: 1  . Years of education: N/A   Occupational History  . Not on file.   Social History Main Topics  . Smoking status: Never Smoker  . Smokeless tobacco: Never Used  . Alcohol use No  . Drug use: No  . Sexual activity: Yes   Other Topics Concern  . Not on file   Social History Narrative   Lives with husband.      Past Surgical History:  Procedure Laterality Date  . COLONOSCOPY  2011  . KNEE ARTHROCENTESIS    . POLYPECTOMY  11-01-2009  . SHOULDER SURGERY Left   . TOTAL KNEE ARTHROPLASTY Left   . VAGINAL HYSTERECTOMY      Family History  Problem Relation Age of Onset  . Heart failure Mother   . Hypertension Sister   . Hypertension Brother     Allergies as of 05/14/2016   No Known Allergies     Medication List       Accurate as of 05/14/16  2:33 PM. Always use your most recent med list.          apixaban 5 MG Tabs tablet Commonly known as:  ELIQUIS Take 1 tablet (5 mg total) by mouth 2 (two) times daily.  diltiazem 180 MG 24 hr capsule Commonly known as:  CARDIZEM CD Take 1 capsule (180 mg total) by mouth daily.          Objective:    BP 122/76   Pulse 87   Temp 97.6 F (36.4 C) (Oral)   Ht 5\' 5"  (1.651 m)   Wt 162 lb 6.4 oz (73.7 kg)   BMI 27.02 kg/m   Wt Readings from Last 3 Encounters:  05/14/16 162 lb 6.4 oz (73.7 kg)  11/16/15 161 lb (73 kg)  09/27/15 160 lb (72.6 kg)    Physical Exam  Constitutional: She is oriented to person, place, and time. She appears well-developed and well-nourished. No distress.  Eyes: Conjunctivae are normal.  Neck: Neck supple. No thyromegaly present.  Cardiovascular: Normal rate, regular  rhythm, normal heart sounds and intact distal pulses.   No murmur heard. Pulmonary/Chest: Effort normal and breath sounds normal. No respiratory distress. She has no wheezes. She has no rales.  Abdominal: Soft. Bowel sounds are normal. She exhibits no distension. There is no tenderness. There is no rebound.  Musculoskeletal: Normal range of motion. She exhibits no edema or tenderness.  Lymphadenopathy:    She has no cervical adenopathy.  Neurological: She is alert and oriented to person, place, and time. Coordination normal.  Skin: Skin is warm and dry. No rash noted. She is not diaphoretic.  Psychiatric: She has a normal mood and affect. Her behavior is normal.  Nursing note and vitals reviewed.     Assessment & Plan:   Problem List Items Addressed This Visit      Cardiovascular and Mediastinum   Atrial fibrillation ([place]) - Primary    Other Visit Diagnoses    Postmenopausal       Needs bone density screening, has been quite a few years since she's had one.   Relevant Orders   DG Bone Density   OAB (overactive bladder)       Gave samples for myrbetriq, urinates 3 times per night, we'll see if it helps.       Follow up plan: Return in about 6 months (around 11/11/2016), or if symptoms worsen or fail to improve.  Caryl Pina, MD [facility] 05/14/2016, 2:33 PM

## 2016-05-15 ENCOUNTER — Other Ambulatory Visit: Payer: Self-pay | Admitting: Cardiology

## 2016-05-15 ENCOUNTER — Other Ambulatory Visit: Payer: Self-pay | Admitting: *Deleted

## 2016-05-15 MED ORDER — APIXABAN 5 MG PO TABS: 5.0000 mg | ORAL_TABLET | Freq: Two times a day (BID) | ORAL | 11 refills | Status: DC

## 2016-05-17 ENCOUNTER — Other Ambulatory Visit: Payer: Self-pay | Admitting: Pharmacist Clinician (PhC)/ Clinical Pharmacy Specialist

## 2016-05-17 MED ORDER — APIXABAN 5 MG PO TABS: 5.0000 mg | ORAL_TABLET | Freq: Two times a day (BID) | ORAL | 1 refills | Status: DC

## 2016-05-23 ENCOUNTER — Encounter: Payer: Self-pay | Admitting: Pharmacist

## 2016-05-23 ENCOUNTER — Ambulatory Visit: Payer: PPO | Admitting: Pharmacist

## 2016-05-23 ENCOUNTER — Ambulatory Visit (INDEPENDENT_AMBULATORY_CARE_PROVIDER_SITE_OTHER): Payer: PPO | Admitting: Pharmacist

## 2016-05-23 VITALS — Ht 65.0 in | Wt 162.0 lb

## 2016-05-23 DIAGNOSIS — M810 Age-related osteoporosis without current pathological fracture: Secondary | ICD-10-CM | POA: Insufficient documentation

## 2016-05-23 DIAGNOSIS — M8580 Other specified disorders of bone density and structure, unspecified site: Secondary | ICD-10-CM | POA: Insufficient documentation

## 2016-05-23 DIAGNOSIS — Z029 Encounter for administrative examinations, unspecified: Secondary | ICD-10-CM

## 2016-05-23 DIAGNOSIS — M8589 Other specified disorders of bone density and structure, multiple sites: Secondary | ICD-10-CM

## 2016-05-23 MED ORDER — CALCIUM CARB-CHOLECALCIFEROL 600-800 MG-UNIT PO TABS: 1.0000 | ORAL_TABLET | Freq: Every day | ORAL | Status: DC

## 2016-05-23 NOTE — Progress Notes (Signed)
Patient ID: Rhonda [name], female   DOB: 1932-12-22, 83 y.o.   MRN: [id number]   HPI: First DEXA performed 05/14/2016.  No history of osteopenia or osteoporosis  Back Pain?  No       Kyphosis?  No Prior fracture?  No Med(s) for Osteoporosis/Osteopenia:  none Med(s) previously tried for Osteoporosis/Osteopenia:  none                                                             PMH: Age at menopause:  49's due to endometriosis Hysterectomy?  Yes Oophorectomy?  Yes HRT? Yes - Former.  Type/duration: estrogen  Steroid Use?  No Thyroid med?  No History of cancer?  No History of digestive disorders (ie Crohn's)?  No Current or previous eating disorders?  No Last Vitamin D Result:  None noted in chart Last GFR Result:  74 (03/12/2016) Calcium = 9.2 (03/12/2016)   FH/SH: Family history of osteoporosis?  No Parent with history of hip fracture?  No Family history of breast cancer?  No Exercise?  No - walks to mailbox daily Smoking?  No Alcohol?  No    Calcium Assessment Calcium Intake  # of servings/day  Calcium mg  Milk (8 oz) 0  x  300  = 0  Yogurt (4 oz) 0 x  200 = 0  Cheese (1 oz) 1 x  200 = 200mg   Other Calcium sources   250mg   Ca supplement 0 = 0   Estimated calcium intake per day 450mg     DEXA Results Date of Test T-Score for AP Spine L3-L4 T-Score for neck of right hip T-Score for Neck of Left Hip  05/14/2016 -1.8 -1.8 -1.4                  Current Height:   5\' 5"      Max Lifetime Height:  5\' 6"  Current Weight:   162#      FRAX 10 year estimate: Total FX risk:  15%  (consider medication if >/= 20%) Hip FX risk:  4.2%  (consider medication if >/= 3%)  Assessment: Osteopenia with increase risk of fracture per FRAX estimate  Recommendations: 1.   Discussed BMD  / DEXA results and discussed fracture risk.  Discussed pharmacotherapy treatment but patient declined today but she did take home information to review and will consider 2.  recommend calcium 1200mg   daily through supplementation or diet.  3.  recommend weight bearing exercise - 30 minutes at least 4 days per week.   4.  Counseled and educated about fall risk and prevention.  Orders Placed This Encounter  Procedures  . VITAMIN D 25 Hydroxy (Vit-D Deficiency, Fractures)  . Phosphorus  . PTH, Intact and Calcium     Recheck DEXA:  2 years  Time spent counseling patient:  40 minutes

## 2016-05-23 NOTE — Patient Instructions (Signed)
.Calcium & Vitamin D: The Facts  Why is calcium and vitamin D consumption important? Calcium: . Most Americans do not consume adequate amounts of calcium! Calcium is required for proper muscle function, nerve communication, bone support, and many other functions in the body.  . The body uses bones as a source of calcium. Bones 'remodel' themselves continuously - the body constantly breaks bone down to release calcium and rebuilds bones by replacing calcium in the bone later.  . As we get older, the rate of bone breakdown occurs faster than bone rebuilding which could lead to osteopenia, osteoporosis, and possible fractures.   Vitamin D: . People naturally make vitamin D in the body when sunlight hits the skin and triggers a process that leads to vitamin D production. This natural vitamin D production requires about 10-15 minutes of sun exposure on the hands, arms, and face at least 2-3 times per week. However, due to decreased sun exposure and the use of sunscreen, most people will need to get additional vitamin D from foods or supplements. Your doctor can measure your body's vitamin D level through a simple blood test to determine your daily vitamin D needs.  . Vitamin D is used to help the body absorb calcium, maintain bone health, help the immune system, and reduce inflammation. It also plays a role in muscle performance, balance and risk of falling.  . Vitamin D deficiency can lead to osteomalacia or softening of the bones, bone pain, and muscle weakness.   The recommended daily allowance of Calcium and Vitamin D varies for different age groups. Age group Calcium (mg) Vitamin D (IU)  Females and Males: Age 3-50 1000 mg 600 IU  Females: Age 85- 85 1200 mg 600 IU  Males: Age 40-70 1000 mg 600 IU  Females and Males: Age 83+ 1200 mg 800 IU  Pregnant/lactating Females age 85-50 1000 mg 600 IU   How much Calcium do you get in your diet? Calcium Intake # of servings per day  Total calcium (mg)   Skim milk, 2% milk (1 cup) _________ x 300 mg   Yogurt (1 small container) _________ x 200 mg   Cheese (1oz) _________ x 200 mg   Cottage Cheese (1 cup)             ________ x 150 mg   Almond milk (1 cup) _________ x 450 mg   Fortified Orange Juice (1 cup) _________ x 300 mg   Broccoli or spinach ( 1 cup) _________ x 100 mg   Salmon (3 oz) _________ x 150 mg    Almonds (1/4 cup) _______ x 90 mg      How do we get Calcium and Vitamin D in our diet? Calcium: . Obtaining calcium from the diet is the most preferred way to reach the recommended daily goal. If this goal is not reached through diet, calcium supplements are available.  . Calcium is found in many foods including: dairy products, dark leafy vegetables (like broccoli, kale, and spinach), fish, and fortified products like juices and cereals.  . The food label will have a %DV (percent daily value) listed showing the amount of calcium per serving. To determine the total mg per serving, simply replace the % with zero (0).  For example, Almond Breeze almond milk contains 45% DV of calcium or 448m per 1 cup.  . You can increase the amount of calcium in your diet by using more calcium products in your daily meals. Use yogurt and fruit to  make smoothies or use yogurt to top baked potatoes or make whipped potatoes. Sprinkle low fat cheese onto salads or into egg white omelets. You can even add non-fat dry milk powder (350m calcium per 1/3 cup) to hot cereals, meat loaf, soups, or potatoes.  . Calcium supplements come in many forms including tablets, chewables, and gummies. Be sure to read the label to determine the correct number of tablets per serving and whether or not to take the supplement with food.  . Calcium carbonate products (Oscal, Caltrate, and Viactiv) are generally better absorbed when taken with food while calcium citrate products like Citracal can be taken with or without food.  . The body can only absorb about 600 mg of calcium  at one time. It is recommended to take calcium supplements in small amounts several times per day.  However, taking it all at once is better than not taking it at all. . Increasing your intake of calcium is essential for bone health, but may also lead to some side effects like constipation, increased gas, bloating or abdominal cramping. To help reduce these side effects, start with 1 tablet per day and slowly increase your intake of the supplement to the recommended doses. It is also recommended that you drink plenty of water each day. Vitamin D: . Very few foods naturally contain vitamin D. However, it is found in saltwater fish (like tuna, salmon and mackerel), beef liver, egg yolks, cheese and vitamin D fortified foods (like yogurt, cereals, orange juice and milk) . The amount of vitamin D in each food or product is listed as %DV on the product label. To determine the total amount of vitamin D per serving, drop the % sign and multiply the number by 4. For example, 1 cup of Almond Breeze almond milk contains 25% DV vitamin D or 100 IU per serving (25 x 4 =100). . Vitamin D is also found in multivitamins and supplements and may be listed as ergocalciferol (vitamin D2) or cholecalciferol (vitamin D3). Each of these forms of vitamin D are equivalent and the daily recommended intake will vary based on your age and the vitamin D levels in your body. Follow your doctor's recommendation for vitamin D intake.       Fall Prevention in the Home Falls can cause injuries and can affect people from all age groups. There are many simple things that you can do to make your home safe and to help prevent falls. What can I do on the outside of my home? Regularly repair the edges of walkways and driveways and fix any cracks. Remove high doorway thresholds. Trim any shrubbery on the main path into your home. Use bright outdoor lighting. Clear walkways of debris and clutter, including tools and rocks. Regularly check  that handrails are securely fastened and in good repair. Both sides of any steps should have handrails. Install guardrails along the edges of any raised decks or porches. Have leaves, snow, and ice cleared regularly. Use sand or salt on walkways during winter months. In the garage, clean up any spills right away, including grease or oil spills. What can I do in the bathroom? Use night lights. Install grab bars by the toilet and in the tub and shower. Do not use towel bars as grab bars. Use non-skid mats or decals on the floor of the tub or shower. If you need to sit down while you are in the shower, use a plastic, non-slip stool. Keep the floor dry. Immediately clean up  any water that spills on the floor. Remove soap buildup in the tub or shower on a regular basis. Attach bath mats securely with double-sided non-slip rug tape. Remove throw rugs and other tripping hazards from the floor. What can I do in the bedroom? Use night lights. Make sure that a bedside light is easy to reach. Do not use oversized bedding that drapes onto the floor. Have a firm chair that has side arms to use for getting dressed. Remove throw rugs and other tripping hazards from the floor. What can I do in the kitchen? Clean up any spills right away. Avoid walking on wet floors. Place frequently used items in easy-to-reach places. If you need to reach for something above you, use a sturdy step stool that has a grab bar. Keep electrical cables out of the way. Do not use floor polish or wax that makes floors slippery. If you have to use wax, make sure that it is non-skid floor wax. Remove throw rugs and other tripping hazards from the floor. What can I do in the stairways? Do not leave any items on the stairs. Make sure that there are handrails on both sides of the stairs. Fix handrails that are broken or loose. Make sure that handrails are as long as the stairways. Check any carpeting to make sure that it is firmly  attached to the stairs. Fix any carpet that is loose or worn. Avoid having throw rugs at the top or bottom of stairways, or secure the rugs with carpet tape to prevent them from moving. Make sure that you have a light switch at the top of the stairs and the bottom of the stairs. If you do not have them, have them installed. What are some other fall prevention tips? Wear closed-toe shoes that fit well and support your feet. Wear shoes that have rubber soles or low heels. When you use a stepladder, make sure that it is completely opened and that the sides are firmly locked. Have someone hold the ladder while you are using it. Do not climb a closed stepladder. Add color or contrast paint or tape to grab bars and handrails in your home. Place contrasting color strips on the first and last steps. Use mobility aids as needed, such as canes, walkers, scooters, and crutches. Turn on lights if it is dark. Replace any light bulbs that burn out. Set up furniture so that there are clear paths. Keep the furniture in the same spot. Fix any uneven floor surfaces. Choose a carpet design that does not hide the edge of steps of a stairway. Be aware of any and all pets. Review your medicines with your healthcare provider. Some medicines can cause dizziness or changes in blood pressure, which increase your risk of falling. Talk with your health care provider about other ways that you can decrease your risk of falls. This may include working with a physical therapist or trainer to improve your strength, balance, and endurance. This information is not intended to replace advice given to you by your health care provider. Make sure you discuss any questions you have with your health care provider. Document Released: 03/30/2002 Document Revised: 09/06/2015 Document Reviewed: 05/14/2014 Elsevier Interactive Patient Education  2017 [place].               Exercise for Strong Bones  Exercise is important to build and  maintain strong bones / bone density.  There are 2 types of exercises that are important to building and maintaining  strong bones:  Weight- bearing and muscle-stregthening.  Weight-bearing Exercises  These exercises include activities that make you move against gravity while staying upright. Weight-bearing exercises can be high-impact or low-impact.  High-impact weight-bearing exercises help build bones and keep them strong. If you have broken a bone due to osteoporosis or are at risk of breaking a bone, you may need to avoid high-impact exercises. If you're not sure, you should check with your healthcare provider.  Examples of high-impact weight-bearing exercises are: Dancing  Doing high-impact aerobics  Hiking  Jogging/running  Jumping Rope  Stair climbing  Tennis  Low-impact weight-bearing exercises can also help keep bones strong and are a safe alternative if you cannot do high-impact exercises.   Examples of low-impact weight-bearing exercises are: Using elliptical training machines  Doing low-impact aerobics  Using stair-step machines  Fast walking on a treadmill or outside   Muscle-Strengthening Exercises These exercises include activities where you move your body, a weight or some other resistance against gravity. They are also known as resistance exercises and include: Lifting weights  Using elastic exercise bands  Using weight machines  Lifting your own body weight  Functional movements, such as standing and rising up on your toes  Yoga and Pilates can also improve strength, balance and flexibility. However, certain positions may not be safe for people with osteoporosis or those at increased risk of broken bones. For example, exercises that have you bend forward may increase the chance of breaking a bone in the spine.   Non-Impact Exercises There are other types of exercises that can help prevent falls.  Non-impact exercises can help you to improve balance, posture and  how well you move in everyday activities. Some of these exercises include: Balance exercises that strengthen your legs and test your balance, such as Tai Chi, can decrease your risk of falls.  Posture exercises that improve your posture and reduce rounded or "sloping" shoulders can help you decrease the chance of breaking a bone, especially in the spine.  Functional exercises that improve how well you move can help you with everyday activities and decrease your chance of falling and breaking a bone. For example, if you have trouble getting up from a chair or climbing stairs, you should do these activities as exercises.   **A physical therapist can teach you balance, posture and functional exercises. He/she can also help you learn which exercises are safe and appropriate for you.  [facility] has a physical therapy office in [place] in front of our office and referrals can be made for assessments and treatment as needed and strength and balance training.  If you would like to have an assessment with [facility] and our physical therapy team please let a nurse or provider know.

## 2016-05-24 ENCOUNTER — Encounter: Payer: Self-pay | Admitting: Pharmacist

## 2016-05-24 ENCOUNTER — Other Ambulatory Visit: Payer: Self-pay | Admitting: Pharmacist

## 2016-05-24 DIAGNOSIS — R7989 Other specified abnormal findings of blood chemistry: Secondary | ICD-10-CM | POA: Insufficient documentation

## 2016-05-24 LAB — PTH, INTACT AND CALCIUM: PTH: 38 pg/mL (ref 15–65)

## 2016-05-24 LAB — VITAMIN D 25 HYDROXY (VIT D DEFICIENCY, FRACTURES): VIT D 25 HYDROXY: 14.9 ng/mL — AB (ref 30.0–100.0)

## 2016-05-24 LAB — CALCIUM: Calcium: 9.3 mg/dL (ref 8.7–10.3)

## 2016-05-24 LAB — SPECIMEN STATUS REPORT

## 2016-05-24 LAB — PHOSPHORUS: Phosphorus: 3.5 mg/dL (ref 2.5–4.5)

## 2016-05-24 MED ORDER — VITAMIN D (ERGOCALCIFEROL) 1.25 MG (50000 UNIT) PO CAPS: 50000.0000 [IU] | ORAL_CAPSULE | ORAL | 0 refills | Status: DC

## 2016-06-05 DIAGNOSIS — M1711 Unilateral primary osteoarthritis, right knee: Secondary | ICD-10-CM | POA: Diagnosis not present

## 2016-06-05 DIAGNOSIS — M25561 Pain in right knee: Secondary | ICD-10-CM | POA: Diagnosis not present

## 2016-06-11 ENCOUNTER — Other Ambulatory Visit: Payer: Self-pay | Admitting: Cardiology

## 2016-07-09 ENCOUNTER — Other Ambulatory Visit: Payer: Self-pay | Admitting: Cardiology

## 2016-08-13 ENCOUNTER — Telehealth: Payer: Self-pay | Admitting: Pharmacist

## 2016-08-13 NOTE — Telephone Encounter (Signed)
Spoke with Mrs. Carillo regarding her husband Viktoriya Glaspy. He was seen at [facility] last week.   See notes in Mr Cortright's chart as call was about Mr. Cloninger.

## 2016-09-24 ENCOUNTER — Encounter: Payer: Self-pay | Admitting: Pharmacist

## 2016-09-24 ENCOUNTER — Ambulatory Visit (INDEPENDENT_AMBULATORY_CARE_PROVIDER_SITE_OTHER): Payer: PPO | Admitting: Pharmacist

## 2016-09-24 VITALS — BP 148/80 | HR 72 | Ht 63.0 in | Wt 157.0 lb

## 2016-09-24 DIAGNOSIS — Z23 Encounter for immunization: Secondary | ICD-10-CM | POA: Diagnosis not present

## 2016-09-24 DIAGNOSIS — Z0000 Encounter for general adult medical examination without abnormal findings: Secondary | ICD-10-CM

## 2016-09-24 DIAGNOSIS — M8589 Other specified disorders of bone density and structure, multiple sites: Secondary | ICD-10-CM | POA: Diagnosis not present

## 2016-09-24 NOTE — Progress Notes (Addendum)
Patient ID: [name], female   DOB: July 23, 1932, 83 y.o.   MRN: [id number]     Subjective:   Rhonda [name] is a 83 y.o. female who presents for a subsequent Medicare Annual Wellness Visit. Rhonda [name] is fairly new to our practice.  Transferred from Dr Murrell Redden office about 5 months ago when she changed to [employer].  Social History: Occupational history: worked for [employer] for 54 years.  Womens Nurse, learning disability Marital history: married - to Rhonda [name].  Rhonda [name] has CHF and his health has declined over the last year.  Rhonda [name] is his primary care giver but recently Rhonda [name] has received nursing services through the [place] - nurse comes out twice per week and a nurses aid comes to bath and shave him 3 times per week for 3 hours.  1 son and 1 gradnson She continues to garden at home  Alcohol/Tobacco/Substances: none   Current Medications (verified) Outpatient Encounter Prescriptions as of 09/24/2016  Medication Sig  . apixaban (ELIQUIS) 5 MG TABS tablet Take 1 tablet (5 mg total) by mouth 2 (two) times daily.  . Calcium Carb-Cholecalciferol (CALCIUM 600+D3) 600-800 MG-UNIT TABS Take 1 tablet by mouth daily.  Marland Kitchen diltiazem (CARDIZEM CD) 180 MG 24 hr capsule TAKE ONE (1) CAPSULE EACH DAY  . [DISCONTINUED] Vitamin D, Ergocalciferol, (DRISDOL) 50000 units CAPS capsule Take 1 capsule (50,000 Units total) by mouth every 7 (seven) days. (Patient not taking: Reported on 09/24/2016)   No facility-administered encounter medications on file as of 09/24/2016.     Allergies (verified) Patient has no known allergies.   History: Past Medical History:  Diagnosis Date  . Arthritis   . Atrial fibrillation ([place]) 05/2015  . Osteopenia   . Syncope 04/2014   Past Surgical History:  Procedure Laterality Date  . COLONOSCOPY  2011  . KNEE ARTHROCENTESIS Bilateral   . POLYPECTOMY  11-01-2009  . SHOULDER SURGERY Left   . TOTAL KNEE ARTHROPLASTY Left   . VAGINAL  HYSTERECTOMY     twice - vaginal and then oopherectomy   Family History  Problem Relation Age of Onset  . Heart failure Mother   . Hypertension Father   . Arthritis Father   . Hypertension Sister   . Arthritis Sister   . Cancer Brother   . Bladder Cancer Brother   . Hypertension Brother   . Diabetes Brother    Social History   Occupational History  . Not on file.   Social History Main Topics  . Smoking status: Never Smoker  . Smokeless tobacco: Never Used  . Alcohol use No  . Drug use: No  . Sexual activity: No    Do you feel safe at home?  Yes Are there smokers in your home (other than you)? No  Dietary issues and exercise activities: Current Exercise Habits: Home exercise routine, Type of exercise: walking, Time (Minutes): 15, Frequency (Times/Week): 7, Weekly Exercise (Minutes/Week): 105, Intensity: Mild  Current Dietary habits:  Rhonda [name] prepares meals at home.  Has larger breakfast and dinner / evening meal.  Small meal / snack at lunch.  He husband has severe CHF so they avoid salt.   Eats lots of vegetables.   Objective:    Today's Vitals   09/24/16 0833 09/24/16 0844  BP: (!) 150/84 (!) 148/80  Pulse: 72   Weight: 157 lb (71.2 kg)   Height: 5\' 3"  (1.6 m)   PainSc: 0-No pain    Body mass index is  27.81 kg/m.  Activities of Daily Living In your present state of health, do you have any difficulty performing the following activities: 09/24/2016  Hearing? N  Vision? N  Difficulty concentrating or making decisions? N  Walking or climbing stairs? N  Dressing or bathing? N  Doing errands, shopping? N  Preparing Food and eating ? N  Using the Toilet? N  In the past six months, have you accidently leaked urine? N  Do you have problems with loss of bowel control? N  Managing your Medications? N  Managing your Finances? N  Housekeeping or managing your Housekeeping? N  Some recent data might be hidden     Cardiac Risk Factors include: advanced age  (>70men, >59 women)  Depression Screen PHQ 2/9 Scores 09/24/2016 05/14/2016  PHQ - 2 Score 0 0     Fall Risk Fall Risk  09/24/2016 05/14/2016  Falls in the past year? No No    Cognitive Function: MMSE - Mini Mental State Exam 09/24/2016  Orientation to time 5  Orientation to Place 5  Registration 3  Attention/ Calculation 5  Recall 3  Language- name 2 objects 2  Language- repeat 1  Language- follow 3 step command 3  Language- read & follow direction 1  Write a sentence 1  Copy design 1  Total score 30    Immunizations and Health Maintenance Immunization History  Administered Date(s) Administered  . Influenza, High Dose Seasonal PF 03/19/2016  . Influenza,inj,Quad PF,36+ Mos 03/23/2015   Health Maintenance Due  Topic Date Due  . TETANUS/TDAP  11/27/1953  . PNA vac Low Risk Adult (1 of 2 - PCV13) 11/28/1999    Patient Care Team: Dettinger, Fransisca Kaufmann, MD as PCP - General (Family Medicine)  Indicate any recent Medical Services you may have received from other than [facility] providers in the past year (date may be approximate).    Assessment:    Annual Wellness Visit    Screening Tests Health Maintenance  Topic Date Due  . TETANUS/TDAP  11/27/1953  . PNA vac Low Risk Adult (1 of 2 - PCV13) 11/28/1999  . INFLUENZA VACCINE  11/21/2016  . DEXA SCAN  05/14/2018        Plan:   During the course of the visit Rhonda [name] was educated and counseled about the following appropriate screening and preventive services:   Vaccines to include Pneumoccal, Influenza, Td and Shingles - received Prevnar 57 today.  She is considering Shingrix and Tdap vaccines  Colorectal cancer screening - UTD  Cardiovascular disease screening - UTD  Diabetes screening - UTD  Bone Denisty / Osteoporosis Screening - UTD  Rechecking vitamin D today   Mammogram - UTD  Glaucoma screening / Eye Exam - due now  Nutrition counseling  Advanced Directives  Physical Activity  Has follow up with PCP  July 2018    Patient Instructions (the written plan) were given to the patient.   Rhonda [name], PharmD   09/24/2016

## 2016-09-24 NOTE — Addendum Note (Signed)
Addended by: Marin Olp on: 09/24/2016 08:58 AM   Modules accepted: Orders

## 2016-09-24 NOTE — Patient Instructions (Addendum)
[name] , Thank you for taking time to come for your Medicare Wellness Visit. I appreciate your ongoing commitment to your health goals. Please review the following plan we discussed and let me know if I can assist you in the future.   Continue to be active - goal is 30 minutes of exercise daily, most days of the week or 150 minutes per week.   Eat a variety of fruits and vegetables Choose low fat dairy products, lean meats and whole grains.    This is a list of the screening recommended for you and due dates:  Health Maintenance  Topic Date Due  . Tetanus Vaccine  11/27/1953  . Pneumonia vaccines (1 of 2 - PCV13) 11/28/1999  . Flu Shot  11/21/2016  . DEXA scan (bone density measurement)  05/14/2018   Health Maintenance, Female Adopting a healthy lifestyle and getting preventive care can go a long way to promote health and wellness. Talk with your health care provider about what schedule of regular examinations is right for you. This is a good chance for you to check in with your provider about disease prevention and staying healthy. In between checkups, there are plenty of things you can do on your own. Experts have done a lot of research about which lifestyle changes and preventive measures are most likely to keep you healthy. Ask your health care provider for more information. Weight and diet Eat a healthy diet  Be sure to include plenty of vegetables, fruits, low-fat dairy products, and lean protein.  Do not eat a lot of foods high in solid fats, added sugars, or salt.  Get regular exercise. This is one of the most important things you can do for your health. ? Most adults should exercise for at least 150 minutes each week. The exercise should increase your heart rate and make you sweat (moderate-intensity exercise). ? Most adults should also do strengthening exercises at least twice a week. This is in addition to the moderate-intensity exercise.  Maintain a healthy  weight  Body mass index (BMI) is a measurement that can be used to identify possible weight problems. It estimates body fat based on height and weight. Your health care provider can help determine your BMI and help you achieve or maintain a healthy weight.  For females 10 years of age and older: ? A BMI below 18.5 is considered underweight. ? A BMI of 18.5 to 24.9 is normal. ? A BMI of 25 to 29.9 is considered overweight. ? A BMI of 30 and above is considered obese.  Watch levels of cholesterol and blood lipids  You should start having your blood tested for lipids and cholesterol at 81 years of age, then have this test every 5 years.  You may need to have your cholesterol levels checked more often if: ? Your lipid or cholesterol levels are high. ? You are older than 81 years of age. ? You are at high risk for heart disease.  Cancer screening Lung Cancer  Lung cancer screening is recommended for adults 44-8 years old who are at high risk for lung cancer because of a history of smoking.  A yearly low-dose CT scan of the lungs is recommended for people who: ? Currently smoke. ? Have quit within the past 15 years. ? Have at least a 30-pack-year history of smoking. A pack year is smoking an average of one pack of cigarettes a day for 1 year.  Yearly screening should continue until it has been  15 years since you quit.  Yearly screening should stop if you develop a health problem that would prevent you from having lung cancer treatment.  Breast Cancer  Practice breast self-awareness. This means understanding how your breasts normally appear and feel.  It also means doing regular breast self-exams. Let your health care provider know about any changes, no matter how small.  If you are in your 20s or 30s, you should have a clinical breast exam (CBE) by a health care provider every 1-3 years as part of a regular health exam.  If you are 32 or older, have a CBE every year. Also consider  having a breast X-ray (mammogram) every year.  If you have a family history of breast cancer, talk to your health care provider about genetic screening.  If you are at high risk for breast cancer, talk to your health care provider about having an MRI and a mammogram every year.  Breast cancer gene (BRCA) assessment is recommended for women who have family members with BRCA-related cancers. BRCA-related cancers include: ? Breast. ? Ovarian. ? Tubal. ? Peritoneal cancers.  Results of the assessment will determine the need for genetic counseling and BRCA1 and BRCA2 testing.  Cervical Cancer Your health care provider may recommend that you be screened regularly for cancer of the pelvic organs (ovaries, uterus, and vagina). This screening involves a pelvic examination, including checking for microscopic changes to the surface of your cervix (Pap test). You may be encouraged to have this screening done every 3 years, beginning at age 55.  For women ages 33-65, health care providers may recommend pelvic exams and Pap testing every 3 years, or they may recommend the Pap and pelvic exam, combined with testing for human papilloma virus (HPV), every 5 years. Some types of HPV increase your risk of cervical cancer. Testing for HPV may also be done on women of any age with unclear Pap test results.  Other health care providers may not recommend any screening for nonpregnant women who are considered low risk for pelvic cancer and who do not have symptoms. Ask your health care provider if a screening pelvic exam is right for you.  If you have had past treatment for cervical cancer or a condition that could lead to cancer, you need Pap tests and screening for cancer for at least 20 years after your treatment. If Pap tests have been discontinued, your risk factors (such as having a new sexual partner) need to be reassessed to determine if screening should resume. Some women have medical problems that increase  the chance of getting cervical cancer. In these cases, your health care provider may recommend more frequent screening and Pap tests.  Colorectal Cancer  This type of cancer can be detected and often prevented.  Routine colorectal cancer screening usually begins at 81 years of age and continues through 81 years of age.  Your health care provider may recommend screening at an earlier age if you have risk factors for colon cancer.  Your health care provider may also recommend using home test kits to check for hidden blood in the stool.  A small camera at the end of a tube can be used to examine your colon directly (sigmoidoscopy or colonoscopy). This is done to check for the earliest forms of colorectal cancer.  Routine screening usually begins at age 32.  Direct examination of the colon should be repeated every 5-10 years through 81 years of age. However, you may need to be screened more  often if early forms of precancerous polyps or small growths are found.  Skin Cancer  Check your skin from head to toe regularly.  Tell your health care provider about any new moles or changes in moles, especially if there is a change in a mole's shape or color.  Also tell your health care provider if you have a mole that is larger than the size of a pencil eraser.  Always use sunscreen. Apply sunscreen liberally and repeatedly throughout the day.  Protect yourself by wearing long sleeves, pants, a wide-brimmed hat, and sunglasses whenever you are outside.  Heart disease, diabetes, and high blood pressure  High blood pressure causes heart disease and increases the risk of stroke. High blood pressure is more likely to develop in: ? People who have blood pressure in the high end of the normal range (130-139/85-89 mm Hg). ? People who are overweight or obese. ? People who are African American.  If you are 71-31 years of age, have your blood pressure checked every 3-5 years. If you are 65 years of age  or older, have your blood pressure checked every year. You should have your blood pressure measured twice-once when you are at a hospital or clinic, and once when you are not at a hospital or clinic. Record the average of the two measurements. To check your blood pressure when you are not at a hospital or clinic, you can use: ? An automated blood pressure machine at a pharmacy. ? A home blood pressure monitor.  If you are between 51 years and 101 years old, ask your health care provider if you should take aspirin to prevent strokes.  Have regular diabetes screenings. This involves taking a blood sample to check your fasting blood sugar level. ? If you are at a normal weight and have a low risk for diabetes, have this test once every three years after 81 years of age. ? If you are overweight and have a high risk for diabetes, consider being tested at a younger age or more often. Preventing infection Hepatitis B  If you have a higher risk for hepatitis B, you should be screened for this virus. You are considered at high risk for hepatitis B if: ? You were born in a country where hepatitis B is common. Ask your health care provider which countries are considered high risk. ? Your parents were born in a high-risk country, and you have not been immunized against hepatitis B (hepatitis B vaccine). ? You have HIV or AIDS. ? You use needles to inject street drugs. ? You live with someone who has hepatitis B. ? You have had sex with someone who has hepatitis B. ? You get hemodialysis treatment. ? You take certain medicines for conditions, including cancer, organ transplantation, and autoimmune conditions.  Hepatitis C  Blood testing is recommended for: ? Everyone born from [birth year] through 1963. ? Anyone with known risk factors for hepatitis C.  Sexually transmitted infections (STIs)  You should be screened for sexually transmitted infections (STIs) including gonorrhea and chlamydia if: ? You are  sexually active and are younger than 81 years of age. ? You are older than 81 years of age and your health care provider tells you that you are at risk for this type of infection. ? Your sexual activity has changed since you were last screened and you are at an increased risk for chlamydia or gonorrhea. Ask your health care provider if you are at risk.  If you do  not have HIV, but are at risk, it may be recommended that you take a prescription medicine daily to prevent HIV infection. This is called pre-exposure prophylaxis (PrEP). You are considered at risk if: ? You are sexually active and do not regularly use condoms or know the HIV status of your partner(s). ? You take drugs by injection. ? You are sexually active with a partner who has HIV.  Talk with your health care provider about whether you are at high risk of being infected with HIV. If you choose to begin PrEP, you should first be tested for HIV. You should then be tested every 3 months for as long as you are taking PrEP. Pregnancy  If you are premenopausal and you may become pregnant, ask your health care provider about preconception counseling.  If you may become pregnant, take 400 to 800 micrograms (mcg) of folic acid every day.  If you want to prevent pregnancy, talk to your health care provider about birth control (contraception). Osteoporosis and menopause  Osteoporosis is a disease in which the bones lose minerals and strength with aging. This can result in serious bone fractures. Your risk for osteoporosis can be identified using a bone density scan.  If you are 81 years of age or older, or if you are at risk for osteoporosis and fractures, ask your health care provider if you should be screened.  Ask your health care provider whether you should take a calcium or vitamin D supplement to lower your risk for osteoporosis.  Menopause may have certain physical symptoms and risks.  Hormone replacement therapy may reduce some  of these symptoms and risks. Talk to your health care provider about whether hormone replacement therapy is right for you. Follow these instructions at home:  Schedule regular health, dental, and eye exams.  Stay current with your immunizations.  Do not use any tobacco products including cigarettes, chewing tobacco, or electronic cigarettes.  If you are pregnant, do not drink alcohol.  If you are breastfeeding, limit how much and how often you drink alcohol.  Limit alcohol intake to no more than 1 drink per day for nonpregnant women. One drink equals 12 ounces of beer, 5 ounces of wine, or 1 ounces of hard liquor.  Do not use street drugs.  Do not share needles.  Ask your health care provider for help if you need support or information about quitting drugs.  Tell your health care provider if you often feel depressed.  Tell your health care provider if you have ever been abused or do not feel safe at home. This information is not intended to replace advice given to you by your health care provider. Make sure you discuss any questions you have with your health care provider. Document Released: 10/23/2010 Document Revised: 09/15/2015 Document Reviewed: 01/11/2015 Elsevier Interactive Patient Education  [employer].

## 2016-09-24 NOTE — Addendum Note (Signed)
Addended by: [name] B on: 09/24/2016 09:01 AM   Modules accepted: Level of Service

## 2016-09-25 LAB — VITAMIN D 25 HYDROXY (VIT D DEFICIENCY, FRACTURES): VIT D 25 HYDROXY: 23.8 ng/mL — AB (ref 30.0–100.0)

## 2016-09-26 DIAGNOSIS — L821 Other seborrheic keratosis: Secondary | ICD-10-CM | POA: Diagnosis not present

## 2016-09-26 DIAGNOSIS — D225 Melanocytic nevi of trunk: Secondary | ICD-10-CM | POA: Diagnosis not present

## 2016-09-26 DIAGNOSIS — D485 Neoplasm of uncertain behavior of skin: Secondary | ICD-10-CM | POA: Diagnosis not present

## 2016-11-12 ENCOUNTER — Other Ambulatory Visit: Payer: Self-pay | Admitting: Cardiology

## 2016-11-13 ENCOUNTER — Ambulatory Visit (INDEPENDENT_AMBULATORY_CARE_PROVIDER_SITE_OTHER): Payer: PPO | Admitting: Family Medicine

## 2016-11-13 ENCOUNTER — Encounter: Payer: Self-pay | Admitting: Family Medicine

## 2016-11-13 VITALS — BP 151/85 | HR 79 | Temp 97.7°F | Ht 63.0 in | Wt 160.0 lb

## 2016-11-13 DIAGNOSIS — I481 Persistent atrial fibrillation: Secondary | ICD-10-CM | POA: Diagnosis not present

## 2016-11-13 DIAGNOSIS — I4819 Other persistent atrial fibrillation: Secondary | ICD-10-CM

## 2016-11-13 DIAGNOSIS — R7989 Other specified abnormal findings of blood chemistry: Secondary | ICD-10-CM | POA: Diagnosis not present

## 2016-11-13 NOTE — Progress Notes (Signed)
BP (!) 158/93   Pulse 79   Temp 97.7 F (36.5 C) (Oral)   Ht _0  (1.6 m)   Wt 160 lb (72.6 kg)   BMI 28.34 kg/m    Subjective:    Patient ID: Rhonda [name], female    DOB: 1932-11-07, 83 y.o.   MRN: [id number]  HPI: Rhonda [name] is a 83 y.o. female presenting on 11/13/2016 for Atrial Fibrillation (6 month followup)   HPI A. fib follow-up Patient is coming in today for follow-up on her A. fib. She is currently on Cardizem 180 and eloquent is for anticoagulation. Her heart rate has been controlled and she denies having any symptoms and she has been doing very well on the medication. She denies any chest pain or shortness of breath.  Low vitamin D recheck Patient is coming in for recheck of low vitamin D. She is currently on multivitamin vitamin containing calcium and vitamin D. She denies any issues with fatigue. We will recheck this today.  Relevant past medical, surgical, family and social history reviewed and updated as indicated. Interim medical history since our last visit reviewed. Allergies and medications reviewed and updated.  Review of Systems  Constitutional: Negative for chills and fever.  HENT: Negative for congestion, ear discharge and ear pain.   Eyes: Negative for redness and visual disturbance.  Respiratory: Negative for chest tightness and shortness of breath.   Cardiovascular: Negative for chest pain and leg swelling.  Genitourinary: Negative for difficulty urinating and dysuria.  Musculoskeletal: Negative for back pain and gait problem.  Skin: Negative for rash.  Neurological: Negative for dizziness, light-headedness and headaches.  Psychiatric/Behavioral: Negative for agitation and behavioral problems.  All other systems reviewed and are negative.   Per HPI unless specifically indicated above   Allergies as of 11/13/2016   No Known Allergies     Medication List       Accurate as of 11/13/16 10:27 AM. Always use your most recent med list.            apixaban 5 MG Tabs tablet Commonly known as:  ELIQUIS Take 1 tablet (5 mg total) by mouth 2 (two) times daily. Need follow up appointment prior to next refill authorization   Calcium Carb-Cholecalciferol 600-800 MG-UNIT Tabs Commonly known as:  CALCIUM 600+D3 Take 1 tablet by mouth daily.   diltiazem 180 MG 24 hr capsule Commonly known as:  CARDIZEM CD TAKE ONE (1) CAPSULE EACH DAY          Objective:    BP (!) 158/93   Pulse 79   Temp 97.7 F (36.5 C) (Oral)   Ht _1  (1.6 m)   Wt 160 lb (72.6 kg)   BMI 28.34 kg/m   Wt Readings from Last 3 Encounters:  11/13/16 160 lb (72.6 kg)  09/24/16 157 lb (71.2 kg)  05/23/16 162 lb (73.5 kg)    Physical Exam  Constitutional: She is oriented to person, place, and time. She appears well-developed and well-nourished. No distress.  Eyes: Conjunctivae are normal.  Cardiovascular: Normal rate, normal heart sounds and intact distal pulses.  An irregularly irregular rhythm present.  No murmur heard. Pulmonary/Chest: Effort normal and breath sounds normal. No respiratory distress. She has no wheezes.  Musculoskeletal: Normal range of motion. She exhibits no edema or tenderness.  Neurological: She is alert and oriented to person, place, and time. Coordination normal.  Skin: Skin is warm and dry. No rash noted. She is not diaphoretic.  Psychiatric:  She has a normal mood and affect. Her behavior is normal.  Nursing note and vitals reviewed.       Assessment & Plan:   Problem List Items Addressed This Visit      Cardiovascular and Mediastinum   Atrial fibrillation ([place]) - Primary   Relevant Orders   CMP14+EGFR (Completed)   CBC with Differential/Platelet (Completed)   Lipid panel (Completed)   Thyroid Panel With TSH (Completed)     Other   Low serum vitamin D   Relevant Orders   VITAMIN D 25 Hydroxy (Vit-D Deficiency, Fractures) (Completed)       Follow up plan: Return in about 6 months (around 05/16/2017), or if  symptoms worsen or fail to improve.  Counseling provided for all of the vaccine components Orders Placed This Encounter  Procedures  . CMP14+EGFR  . CBC with Differential/Platelet  . Lipid panel  . Thyroid Panel With TSH  . VITAMIN D 25 Hydroxy (Vit-D Deficiency, Fractures)    Caryl Pina, MD [facility] 11/13/2016, 10:27 AM

## 2016-11-14 LAB — CBC WITH DIFFERENTIAL/PLATELET
BASOS ABS: 0.1 10*3/uL (ref 0.0–0.2)
BASOS: 2 %
EOS (ABSOLUTE): 0.1 10*3/uL (ref 0.0–0.4)
Eos: 2 %
Hematocrit: 44.1 % (ref 34.0–46.6)
Hemoglobin: 14.9 g/dL (ref 11.1–15.9)
IMMATURE GRANS (ABS): 0 10*3/uL (ref 0.0–0.1)
Immature Granulocytes: 0 %
LYMPHS ABS: 1.5 10*3/uL (ref 0.7–3.1)
Lymphs: 20 %
MCH: 29.7 pg (ref 26.6–33.0)
MCHC: 33.8 g/dL (ref 31.5–35.7)
MCV: 88 fL (ref 79–97)
MONOS ABS: 0.6 10*3/uL (ref 0.1–0.9)
Monocytes: 8 %
NEUTROS ABS: 5.2 10*3/uL (ref 1.4–7.0)
Neutrophils: 68 %
PLATELETS: 267 10*3/uL (ref 150–379)
RBC: 5.02 x10E6/uL (ref 3.77–5.28)
RDW: 13.5 % (ref 12.3–15.4)
WBC: 7.5 10*3/uL (ref 3.4–10.8)

## 2016-11-14 LAB — CMP14+EGFR
A/G RATIO: 1.6 (ref 1.2–2.2)
ALT: 11 IU/L (ref 0–32)
AST: 13 IU/L (ref 0–40)
Albumin: 4.3 g/dL (ref 3.5–4.7)
Alkaline Phosphatase: 89 IU/L (ref 39–117)
BILIRUBIN TOTAL: 0.4 mg/dL (ref 0.0–1.2)
BUN / CREAT RATIO: 26 (ref 12–28)
BUN: 19 mg/dL (ref 8–27)
CALCIUM: 9.3 mg/dL (ref 8.7–10.3)
CHLORIDE: 97 mmol/L (ref 96–106)
CO2: 23 mmol/L (ref 20–29)
Creatinine, Ser: 0.73 mg/dL (ref 0.57–1.00)
GFR, EST AFRICAN AMERICAN: 89 mL/min/{1.73_m2} (ref >59)
GFR, EST NON AFRICAN AMERICAN: 77 mL/min/{1.73_m2} (ref >59)
GLOBULIN, TOTAL: 2.7 g/dL (ref 1.5–4.5)
Glucose: 82 mg/dL (ref 65–99)
POTASSIUM: 4.3 mmol/L (ref 3.5–5.2)
SODIUM: 136 mmol/L (ref 134–144)
TOTAL PROTEIN: 7 g/dL (ref 6.0–8.5)

## 2016-11-14 LAB — THYROID PANEL WITH TSH
Free Thyroxine Index: 2.3 (ref 1.2–4.9)
T3 UPTAKE RATIO: 25 % (ref 24–39)
T4 TOTAL: 9.1 ug/dL (ref 4.5–12.0)
TSH: 3.61 u[IU]/mL (ref 0.450–4.500)

## 2016-11-14 LAB — LIPID PANEL
Chol/HDL Ratio: 3.7 ratio (ref 0.0–4.4)
Cholesterol, Total: 187 mg/dL (ref 100–199)
HDL: 50 mg/dL (ref >39)
LDL Calculated: 108 mg/dL — ABNORMAL HIGH (ref 0–99)
Triglycerides: 146 mg/dL (ref 0–149)
VLDL CHOLESTEROL CAL: 29 mg/dL (ref 5–40)

## 2016-11-14 LAB — VITAMIN D 25 HYDROXY (VIT D DEFICIENCY, FRACTURES): VIT D 25 HYDROXY: 25.5 ng/mL — AB (ref 30.0–100.0)

## 2016-11-16 DIAGNOSIS — H25813 Combined forms of age-related cataract, bilateral: Secondary | ICD-10-CM | POA: Diagnosis not present

## 2016-11-16 DIAGNOSIS — H5203 Hypermetropia, bilateral: Secondary | ICD-10-CM | POA: Diagnosis not present

## 2016-11-16 DIAGNOSIS — H524 Presbyopia: Secondary | ICD-10-CM | POA: Diagnosis not present

## 2016-11-16 DIAGNOSIS — H52222 Regular astigmatism, left eye: Secondary | ICD-10-CM | POA: Diagnosis not present

## 2017-01-02 ENCOUNTER — Other Ambulatory Visit: Payer: Self-pay | Admitting: Cardiology

## 2017-02-11 ENCOUNTER — Other Ambulatory Visit: Payer: Self-pay | Admitting: Cardiology

## 2017-02-12 ENCOUNTER — Encounter: Payer: Self-pay | Admitting: Cardiology

## 2017-02-24 NOTE — Progress Notes (Signed)
HPI The patient presents for evaluation of atrial fibrillation.  I saw her previously for syncope which was felt to be vagal. She has also had atrial fib requiring cardioversion.  Her husband died a couple of weeks ago.  She is doing relatively well with the stress of this.  He had a progressive decline over the years.  She is of course not sleeping well.  She does have some episodes of dizziness and presyncope probably 3 times in the last couple of months but she has been under increased stress.  This has been similar to previous episodes in which she knows she is getting dizzy and she goes to sit down.  She has not had any frank loss of consciousness.  She does not feel palpitations when this is happening she is not describing orthostatic changes/. She is typically up and doing something with it and also happen when she is seated.  She states she does get a "cold sweat" when it happens.   No Known Allergies  Prior to Admission medications   Medication Sig Start Date End Date Taking? Authorizing Provider  apixaban (ELIQUIS) 5 MG TABS tablet Take 1 tablet (5 mg total) by mouth 2 (two) times daily. 02/11/17  Yes [name], MD  Calcium Carb-Cholecalciferol (CALCIUM 600+D3) 600-800 MG-UNIT TABS Take 1 tablet by mouth daily. 05/23/16  Yes [name], PharmD  diltiazem (CARDIZEM CD) 180 MG 24 hr capsule Take one capsule (180mg ) daily.  Please keep upcoming appt for further refills. 01/02/17  Yes [name], MD     PMH: Atrial fib  PSH:  Vaginal hysterectomy, oophorectomy, total knee replacement left, shoulder surgery left, multiple knee arthroscopies   ROS: Positive for insomnia.  Otherwise as stated in the HPI and negative for all other systems.   PHYSICAL EXAM There were no vitals taken for this visit.  GENERAL:  Well appearing NECK:  No jugular venous distention, waveform within normal limits, carotid upstroke brisk and symmetric, no bruits, no thyromegaly LUNGS:  Clear to  auscultation bilaterally CHEST:  Unremarkable HEART:  PMI not displaced or sustained,S1 and S2 within normal limits, no S3, no S4, no clicks, no rubs, no murmurs ABD:  Flat, positive bowel sounds normal in frequency in pitch, no bruits, no rebound, no guarding, no midline pulsatile mass, no hepatomegaly, no splenomegaly EXT:  2 plus pulses throughout, mild ankle edema, no cyanosis no clubbing    EKG: Sinus rhythm, rate 71, axis within normal limits, intervals within normal limits, no acute T wave changes.  ASSESSMENT AND PLAN  ATRIAL FIB:  .  Ms. [name] has a CHA2DS2 - VASc score of 3 with a risk of stroke of 3%.  I do not think this is related to her presyncope.  No change in therapy is indicated.  SYNCOPE: She has not had any frank syncope but I think her presyncope is probably vagal.  I have asked her to check her heart rate and blood pressure when she is having some of the symptoms.  We also talked about avoidance.  She is worn monitors before and I do not think this would be helpful.    QT PROLONGED: QT is actually normal today.  I would still suggest avoiding QT prolonging drugs.    INSOMNIA: I did take the liberty of giving her a 1 months of as needed Ambien 5 mg.  She tolerated this in the past.  I have asked her to discuss this further with her primary care  physician to see if he wants to renew this in the future if she needs it.

## 2017-02-27 ENCOUNTER — Encounter: Payer: Self-pay | Admitting: Cardiology

## 2017-02-27 ENCOUNTER — Ambulatory Visit: Payer: PPO | Admitting: Cardiology

## 2017-02-27 ENCOUNTER — Ambulatory Visit (INDEPENDENT_AMBULATORY_CARE_PROVIDER_SITE_OTHER): Payer: PPO

## 2017-02-27 VITALS — BP 140/90 | HR 70 | Ht 63.0 in | Wt 160.0 lb

## 2017-02-27 DIAGNOSIS — I480 Paroxysmal atrial fibrillation: Secondary | ICD-10-CM

## 2017-02-27 DIAGNOSIS — Z23 Encounter for immunization: Secondary | ICD-10-CM

## 2017-02-27 MED ORDER — ZOLPIDEM TARTRATE 5 MG PO TABS: 5.0000 mg | ORAL_TABLET | Freq: Every evening | ORAL | 1 refills | Status: DC | PRN

## 2017-02-27 NOTE — Patient Instructions (Signed)
Medication Instructions:  The current medical regimen is effective;  continue present plan and medications. You may take Ambien 5 mg at bedtime as needed for sleep.  Please obtain further refills from your primary care doctor.  Follow-Up: Follow up in 1 year with Dr. Percival Spanish in [place].  You will receive a letter in the mail 2 months before you are due.  Please call us when you receive this letter to schedule your follow up appointment.  If you need a refill on your cardiac medications before your next appointment, please call your pharmacy.  Thank you for choosing [facility]!!

## 2017-03-18 DIAGNOSIS — M1711 Unilateral primary osteoarthritis, right knee: Secondary | ICD-10-CM | POA: Diagnosis not present

## 2017-03-18 DIAGNOSIS — M25561 Pain in right knee: Secondary | ICD-10-CM | POA: Diagnosis not present

## 2017-03-27 ENCOUNTER — Other Ambulatory Visit: Payer: Self-pay | Admitting: Cardiology

## 2017-04-24 ENCOUNTER — Other Ambulatory Visit: Payer: Self-pay | Admitting: Family Medicine

## 2017-04-24 ENCOUNTER — Other Ambulatory Visit: Payer: Self-pay | Admitting: Cardiology

## 2017-05-13 ENCOUNTER — Other Ambulatory Visit: Payer: Self-pay | Admitting: Cardiology

## 2017-05-17 ENCOUNTER — Ambulatory Visit (INDEPENDENT_AMBULATORY_CARE_PROVIDER_SITE_OTHER): Payer: PPO | Admitting: Family Medicine

## 2017-05-17 ENCOUNTER — Encounter: Payer: Self-pay | Admitting: Family Medicine

## 2017-05-17 VITALS — BP 129/88 | HR 82 | Temp 98.1°F | Ht 63.0 in | Wt 162.0 lb

## 2017-05-17 DIAGNOSIS — N3281 Overactive bladder: Secondary | ICD-10-CM

## 2017-05-17 DIAGNOSIS — G4700 Insomnia, unspecified: Secondary | ICD-10-CM | POA: Diagnosis not present

## 2017-05-17 DIAGNOSIS — R7989 Other specified abnormal findings of blood chemistry: Secondary | ICD-10-CM

## 2017-05-17 DIAGNOSIS — I480 Paroxysmal atrial fibrillation: Secondary | ICD-10-CM

## 2017-05-17 DIAGNOSIS — R55 Syncope and collapse: Secondary | ICD-10-CM

## 2017-05-17 LAB — CBC WITH DIFFERENTIAL/PLATELET
Basophils Absolute: 0.1 10*3/uL (ref 0.0–0.2)
Basos: 1 %
EOS (ABSOLUTE): 0.1 10*3/uL (ref 0.0–0.4)
EOS: 2 %
HEMATOCRIT: 44.8 % (ref 34.0–46.6)
HEMOGLOBIN: 15.1 g/dL (ref 11.1–15.9)
Immature Grans (Abs): 0 10*3/uL (ref 0.0–0.1)
Immature Granulocytes: 0 %
LYMPHS ABS: 1.2 10*3/uL (ref 0.7–3.1)
Lymphs: 18 %
MCH: 30 pg (ref 26.6–33.0)
MCHC: 33.7 g/dL (ref 31.5–35.7)
MCV: 89 fL (ref 79–97)
MONOCYTES: 9 %
MONOS ABS: 0.6 10*3/uL (ref 0.1–0.9)
NEUTROS ABS: 4.8 10*3/uL (ref 1.4–7.0)
Neutrophils: 70 %
Platelets: 258 10*3/uL (ref 150–379)
RBC: 5.03 x10E6/uL (ref 3.77–5.28)
RDW: 13.4 % (ref 12.3–15.4)
WBC: 6.9 10*3/uL (ref 3.4–10.8)

## 2017-05-17 LAB — CMP14+EGFR
A/G RATIO: 1.5 (ref 1.2–2.2)
ALT: 20 IU/L (ref 0–32)
AST: 13 IU/L (ref 0–40)
Albumin: 4.2 g/dL (ref 3.5–4.7)
Alkaline Phosphatase: 87 IU/L (ref 39–117)
BILIRUBIN TOTAL: 0.4 mg/dL (ref 0.0–1.2)
BUN/Creatinine Ratio: 16 (ref 12–28)
BUN: 16 mg/dL (ref 8–27)
CALCIUM: 9.8 mg/dL (ref 8.7–10.3)
CHLORIDE: 98 mmol/L (ref 96–106)
CO2: 24 mmol/L (ref 20–29)
Creatinine, Ser: 1 mg/dL (ref 0.57–1.00)
GFR, EST AFRICAN AMERICAN: 61 mL/min/{1.73_m2} (ref >59)
GFR, EST NON AFRICAN AMERICAN: 53 mL/min/{1.73_m2} — AB (ref >59)
GLUCOSE: 95 mg/dL (ref 65–99)
Globulin, Total: 2.8 g/dL (ref 1.5–4.5)
POTASSIUM: 4.6 mmol/L (ref 3.5–5.2)
Sodium: 137 mmol/L (ref 134–144)
TOTAL PROTEIN: 7 g/dL (ref 6.0–8.5)

## 2017-05-17 LAB — LIPID PANEL
Chol/HDL Ratio: 3.4 ratio (ref 0.0–4.4)
Cholesterol, Total: 173 mg/dL (ref 100–199)
HDL: 51 mg/dL (ref >39)
LDL Calculated: 95 mg/dL (ref 0–99)
TRIGLYCERIDES: 136 mg/dL (ref 0–149)
VLDL CHOLESTEROL CAL: 27 mg/dL (ref 5–40)

## 2017-05-17 MED ORDER — ZOLPIDEM TARTRATE ER 6.25 MG PO TBCR: 6.2500 mg | EXTENDED_RELEASE_TABLET | Freq: Every evening | ORAL | 2 refills | Status: DC | PRN

## 2017-05-17 MED ORDER — MIRABEGRON ER 25 MG PO TB24: 25.0000 mg | ORAL_TABLET | Freq: Every day | ORAL | 3 refills | Status: DC

## 2017-05-17 MED ORDER — DILTIAZEM HCL ER COATED BEADS 120 MG PO CP24: ORAL_CAPSULE | ORAL | 1 refills | Status: DC

## 2017-05-17 MED ORDER — APIXABAN 5 MG PO TABS: 5.0000 mg | ORAL_TABLET | Freq: Two times a day (BID) | ORAL | 0 refills | Status: DC

## 2017-05-17 NOTE — Progress Notes (Signed)
BP 129/88   Pulse 82   Temp 98.1 F (36.7 C) (Oral)   Ht 5' 3"  (1.6 m)   Wt 162 lb (73.5 kg)   BMI 28.70 kg/m    Subjective:    Patient ID: Rhonda [name], female    DOB: [birth year], 84 y.o.   MRN: [id number]  HPI: Rhonda [name] is a 84 y.o. female presenting on 05/17/2017 for 6 month follow up (near syncopal episodes recently, per Dr. Rosezella [place] recommendation, she would check BP during that time and she was hypotensive); Insomnia (taking Ambien but will only sleep 2-3 hours); and Knee Pain   HPI Near syncopal episodes Patient is coming in with complaints of near syncopal episodes that have been going on over the past few months.  She denies any full syncopal episodes or major falls but she has felt lightheaded at times when these have happened and she sought her cardiologist who recommended that she take her blood pressure when this was happening.  A. fib management Patient has A. fib and is currently on Eliquis and Cardizem for management and her blood pressure has been running a little bit lower and she is been having near syncopal episodes and we will reduce her Cardizem levels.  She denies any chest pain or palpitations but just has been having these near syncopal episodes.  Urinary issues Patient admits to some urinary issues, more specifically she has been having issues with urinary leakage and also having some overflow incontinence when she coughs or sneezes.  She denies any burning with urination or blood in her urine.  No flank pain.  Difficulty sleeping Patient has been having some difficulty sleeping and has been using Ambien to fall asleep but has been having difficulty staying asleep and a lot of that has been problems with anxiety.  Relevant past medical, surgical, family and social history reviewed and updated as indicated. Interim medical history since our last visit reviewed. Allergies and medications reviewed and updated.  Review of Systems  Constitutional:  Negative for chills and fever.  Eyes: Negative for visual disturbance.  Respiratory: Negative for chest tightness and shortness of breath.   Cardiovascular: Positive for palpitations. Negative for chest pain and leg swelling.  Genitourinary: Positive for frequency. Negative for decreased urine volume, difficulty urinating, dysuria, flank pain, hematuria and urgency.  Musculoskeletal: Negative for back pain and gait problem.  Skin: Negative for rash.  Neurological: Positive for light-headedness. Negative for headaches.  Psychiatric/Behavioral: Positive for sleep disturbance. Negative for agitation and behavioral problems.  All other systems reviewed and are negative.   Per HPI unless specifically indicated above   Allergies as of 05/17/2017   No Known Allergies     Medication List        Accurate as of 05/17/17  9:14 AM. Always use your most recent med list.          apixaban 5 MG Tabs tablet Commonly known as:  ELIQUIS Take 1 tablet (5 mg total) by mouth 2 (two) times daily.   Calcium Carb-Cholecalciferol 600-800 MG-UNIT Tabs Commonly known as:  CALCIUM 600+D3 Take 1 tablet by mouth daily.   diltiazem 120 MG 24 hr capsule Commonly known as:  CARDIZEM CD TAKE ONE (1) CAPSULE EACH DAY   mirabegron ER 25 MG Tb24 tablet Commonly known as:  MYRBETRIQ Take 1 tablet (25 mg total) by mouth daily.   zolpidem 6.25 MG CR tablet Commonly known as:  AMBIEN CR Take 1-2 tablets (6.25-12.5 mg total) by mouth  at bedtime as needed for sleep.          Objective:    BP 129/88   Pulse 82   Temp 98.1 F (36.7 C) (Oral)   Ht 5' 3"  (1.6 m)   Wt 162 lb (73.5 kg)   BMI 28.70 kg/m   Wt Readings from Last 3 Encounters:  05/17/17 162 lb (73.5 kg)  02/27/17 160 lb (72.6 kg)  11/13/16 160 lb (72.6 kg)    Physical Exam  Constitutional: She is oriented to person, place, and time. She appears well-developed and well-nourished. No distress.  Eyes: Conjunctivae are normal.  Neck: Neck  supple. No thyromegaly present.  Cardiovascular: Normal rate, normal heart sounds and intact distal pulses. An irregularly irregular rhythm present.  No murmur heard. Pulmonary/Chest: Effort normal and breath sounds normal. No respiratory distress. She has no wheezes.  Musculoskeletal: Normal range of motion. She exhibits edema (1+ edema in bilateral lower extremities).  Lymphadenopathy:    She has no cervical adenopathy.  Neurological: She is alert and oriented to person, place, and time. Coordination normal.  Skin: Skin is warm and dry. No rash noted. She is not diaphoretic.  Psychiatric: She has a normal mood and affect. Her behavior is normal.  Nursing note and vitals reviewed.     Assessment & Plan:   Problem List Items Addressed This Visit      Cardiovascular and Mediastinum   Syncope - Primary   Relevant Medications   apixaban (ELIQUIS) 5 MG TABS tablet   diltiazem (CARDIZEM CD) 120 MG 24 hr capsule   Atrial fibrillation (HCC)   Relevant Medications   apixaban (ELIQUIS) 5 MG TABS tablet   diltiazem (CARDIZEM CD) 120 MG 24 hr capsule   Other Relevant Orders   CMP14+EGFR   Lipid panel   CBC with Differential/Platelet     Other   Low serum vitamin D    Other Visit Diagnoses    OAB (overactive bladder)       Relevant Medications   mirabegron ER (MYRBETRIQ) 25 MG TB24 tablet   Insomnia, unspecified type       Relevant Medications   zolpidem (AMBIEN CR) 6.25 MG CR tablet      Will try and start her on Myrbetriq and see if that helps with her nighttime urination more, continue current medication and check labs.,  Will lower her Cardizem because of hypotensive episodes.  Follow up plan: Return in about 6 months (around 11/14/2017), or if symptoms worsen or fail to improve, for A. fib and labs recheck.  Counseling provided for all of the vaccine components Orders Placed This Encounter  Procedures  . CMP14+EGFR  . Lipid panel  . CBC with Differential/Platelet     Caryl Pina, MD [facility] 05/17/2017, 9:14 AM

## 2017-06-10 DIAGNOSIS — M1711 Unilateral primary osteoarthritis, right knee: Secondary | ICD-10-CM | POA: Diagnosis not present

## 2017-06-12 ENCOUNTER — Other Ambulatory Visit: Payer: Self-pay | Admitting: Family Medicine

## 2017-07-10 ENCOUNTER — Other Ambulatory Visit: Payer: Self-pay | Admitting: Cardiology

## 2017-07-16 ENCOUNTER — Other Ambulatory Visit: Payer: Self-pay | Admitting: Family Medicine

## 2017-07-18 DIAGNOSIS — M1711 Unilateral primary osteoarthritis, right knee: Secondary | ICD-10-CM | POA: Diagnosis not present

## 2017-07-18 DIAGNOSIS — M25561 Pain in right knee: Secondary | ICD-10-CM | POA: Diagnosis not present

## 2017-08-07 ENCOUNTER — Other Ambulatory Visit: Payer: Self-pay | Admitting: Family Medicine

## 2017-08-07 DIAGNOSIS — R55 Syncope and collapse: Secondary | ICD-10-CM

## 2017-08-07 DIAGNOSIS — I480 Paroxysmal atrial fibrillation: Secondary | ICD-10-CM

## 2017-09-05 ENCOUNTER — Other Ambulatory Visit: Payer: Self-pay | Admitting: Family Medicine

## 2017-09-05 DIAGNOSIS — Z1231 Encounter for screening mammogram for malignant neoplasm of breast: Secondary | ICD-10-CM

## 2017-09-16 NOTE — Progress Notes (Signed)
    HPI The patient presents for evaluation of atrial fibrillation.  I saw her previously for syncope which was felt to be vagal. She has also had atrial fib requiring cardioversion.  Since I last saw her she has done well.  She has had 2 - 3 episodes of fib lasting about an hour.  They happen mostly at night.  She is not particularly bothered by these.  She goes to church and to dinner with friends and family.  She is limited by arthritis.  The patient denies any new symptoms such as chest discomfort, neck or arm discomfort. There has been no new shortness of breath, PND or orthopnea. There has been no reported syncope.  She did have episodes of lightheadedness and noticed that her blood pressure was 90/30 when she had these.  She had her dose of diltiazem reduced and she has not had any recurrence of this since then.   No Known Allergies  Prior to Admission medications   Medication Sig Start Date End Date Taking? Authorizing Provider  apixaban (ELIQUIS) 5 MG TABS tablet Take 1 tablet (5 mg total) by mouth 2 (two) times daily. 05/17/17  Yes Dettinger, Fransisca Kaufmann, MD  Calcium Carb-Cholecalciferol (CALCIUM 600+D3) 600-800 MG-UNIT TABS Take 1 tablet by mouth daily. 05/23/16  Yes [name], PharmD  diltiazem (CARDIZEM CD) 120 MG 24 hr capsule TAKE ONE (1) CAPSULE EACH DAY 08/07/17  Yes Dettinger, Fransisca Kaufmann, MD  zolpidem (AMBIEN) 5 MG tablet TAKE 1 TABLET AT BEDTIME AS NEEDED FOR SLEEP 07/17/17  Yes Dettinger, Fransisca Kaufmann, MD     PMH: Atrial fib  PSH:  Vaginal hysterectomy, oophorectomy, total knee replacement left, shoulder surgery left, multiple knee arthroscopies   ROS: Insomnia.   Otherwise as stated in the HPI and negative for all other systems.  PHYSICAL EXAM BP 140/82   Pulse 73   Ht 5\' 5"  (1.651 m)   Wt 162 lb (73.5 kg)   BMI 26.96 kg/m   GENERAL:  Well appearing NECK:  No jugular venous distention, waveform within normal limits, carotid upstroke brisk and symmetric, no bruits, no  thyromegaly LUNGS:  Clear to auscultation bilaterally CHEST:  Unremarkable HEART:  PMI not displaced or sustained,S1 and S2 within normal limits, no S3, no S4, no clicks, no rubs, no murmurs ABD:  Flat, positive bowel sounds normal in frequency in pitch, no bruits, no rebound, no guarding, no midline pulsatile mass, no hepatomegaly, no splenomegaly EXT:  2 plus pulses throughout, no edema, no cyanosis no clubbing    EKG: Sinus rhythm, rate 73, axis within normal limits, intervals within normal limits, no acute T wave changes.  ASSESSMENT AND PLAN  ATRIAL FIB:  .  Ms. [name] has a CHA2DS2 - VASc score of 3 with a risk of stroke of 3%. No change in therapy.   SYNCOPE:        She is had no frank syncope but has had some lightheadedness associated with low blood pressures.  This is improved with the reduced dose of Cardizem.  No change in therapy.  QT PROLONGED:     QT is not prolonged.  I would still suggest avoiding QT prolonging drugs.  She has had prolonged QT in the past but no symptoms.

## 2017-09-18 ENCOUNTER — Encounter: Payer: Self-pay | Admitting: Cardiology

## 2017-09-18 ENCOUNTER — Ambulatory Visit: Payer: PPO | Admitting: Cardiology

## 2017-09-18 VITALS — BP 140/82 | HR 73 | Ht 65.0 in | Wt 162.0 lb

## 2017-09-18 DIAGNOSIS — I480 Paroxysmal atrial fibrillation: Secondary | ICD-10-CM

## 2017-09-18 DIAGNOSIS — R55 Syncope and collapse: Secondary | ICD-10-CM | POA: Diagnosis not present

## 2017-09-18 NOTE — Patient Instructions (Signed)

## 2017-09-19 ENCOUNTER — Other Ambulatory Visit: Payer: Self-pay | Admitting: Family Medicine

## 2017-09-19 ENCOUNTER — Encounter: Payer: Self-pay | Admitting: Family Medicine

## 2017-09-19 ENCOUNTER — Ambulatory Visit (INDEPENDENT_AMBULATORY_CARE_PROVIDER_SITE_OTHER): Payer: PPO | Admitting: Family Medicine

## 2017-09-19 VITALS — BP 136/86 | HR 79 | Temp 98.1°F | Ht 65.0 in | Wt 163.0 lb

## 2017-09-19 DIAGNOSIS — E663 Overweight: Secondary | ICD-10-CM

## 2017-09-19 DIAGNOSIS — M7631 Iliotibial band syndrome, right leg: Secondary | ICD-10-CM

## 2017-09-19 MED ORDER — PREDNISONE 20 MG PO TABS: ORAL_TABLET | ORAL | 0 refills | Status: DC

## 2017-09-19 NOTE — Progress Notes (Signed)
BP 136/86   Pulse 79   Temp 98.1 F (36.7 C) (Oral)   Ht 5\' 5"  (1.651 m)   Wt 163 lb (73.9 kg)   BMI 27.12 kg/m    Subjective:    Patient ID: [name], female    DOB: May 04, 1932, 84 y.o.   MRN: [id number]  HPI: NARCISSA MELDER is a 84 y.o. female presenting on 09/19/2017 for Back and hip pain   HPI Right lateral leg/hip pain Patient comes in complaining of right lateral leg/hip pain that extends from her buttocks all the way down the lateral aspect of her leg to the knee.  She denies any numbness or weakness but definitely feels the pain especially when she goes up and down stairs.  She denies any fall or inciting incident.  She has been having this pain for about a week.  She has been using Tylenol which helps some but is not alleviating the pain.  She denies any fevers or chills or redness or warmth.  We discussed weight loss and how this could help prevent this from occurring and also physical therapy and how this could prevent this from occurring as well.  Relevant past medical, surgical, family and social history reviewed and updated as indicated. Interim medical history since our last visit reviewed. Allergies and medications reviewed and updated.  Review of Systems  Constitutional: Negative for chills and fever.  Eyes: Negative for visual disturbance.  Respiratory: Negative for chest tightness and shortness of breath.   Cardiovascular: Negative for chest pain and leg swelling.  Musculoskeletal: Positive for arthralgias. Negative for back pain, gait problem and joint swelling.  Skin: Negative for color change and rash.  Neurological: Negative for light-headedness and headaches.  Psychiatric/Behavioral: Negative for agitation and behavioral problems.  All other systems reviewed and are negative.   Per HPI unless specifically indicated above   Allergies as of 09/19/2017   No Known Allergies     Medication List        Accurate as of 09/19/17  2:46 PM. Always  use your most recent med list.          apixaban 5 MG Tabs tablet Commonly known as:  ELIQUIS Take 1 tablet (5 mg total) by mouth 2 (two) times daily.   Calcium Carb-Cholecalciferol 600-800 MG-UNIT Tabs Commonly known as:  CALCIUM 600+D3 Take 1 tablet by mouth daily.   diltiazem 120 MG 24 hr capsule Commonly known as:  CARDIZEM CD TAKE ONE (1) CAPSULE EACH DAY   predniSONE 20 MG tablet Commonly known as:  DELTASONE 2 po at same time daily for 5 days   zolpidem 5 MG tablet Commonly known as:  AMBIEN TAKE 1 TABLET AT BEDTIME AS NEEDED FOR SLEEP          Objective:    BP 136/86   Pulse 79   Temp 98.1 F (36.7 C) (Oral)   Ht 5\' 5"  (1.651 m)   Wt 163 lb (73.9 kg)   BMI 27.12 kg/m   Wt Readings from Last 3 Encounters:  09/19/17 163 lb (73.9 kg)  09/18/17 162 lb (73.5 kg)  05/17/17 162 lb (73.5 kg)    Physical Exam  Constitutional: She is oriented to person, place, and time. She appears well-developed and well-nourished. No distress.  Eyes: Conjunctivae are normal.  Cardiovascular: Normal rate, regular rhythm, normal heart sounds and intact distal pulses.  No murmur heard. Pulmonary/Chest: Effort normal and breath sounds normal. No respiratory distress. She has no wheezes.  Musculoskeletal: Normal range of motion. She exhibits tenderness (Lateral tenderness over IT band extending down to the lateral aspect of the knee on the right). She exhibits no edema.  Neurological: She is alert and oriented to person, place, and time. Coordination normal.  Skin: Skin is warm and dry. No rash noted. She is not diaphoretic.  Psychiatric: She has a normal mood and affect. Her behavior is normal.  Nursing note and vitals reviewed.       Assessment & Plan:   Problem List Items Addressed This Visit      Other   Overweight (BMI 25.0-29.9)    Other Visit Diagnoses    It band syndrome, right    -  Primary   Relevant Medications   predniSONE (DELTASONE) 20 MG tablet   Other  Relevant Orders   Ambulatory referral to Physical Therapy       Follow up plan: Return if symptoms worsen or fail to improve.  Counseling provided for all of the vaccine components Orders Placed This Encounter  Procedures  . Ambulatory referral to Physical Therapy    Caryl Pina, MD [facility] 09/19/2017, 2:46 PM

## 2017-09-25 DIAGNOSIS — L821 Other seborrheic keratosis: Secondary | ICD-10-CM | POA: Diagnosis not present

## 2017-09-25 DIAGNOSIS — L570 Actinic keratosis: Secondary | ICD-10-CM | POA: Diagnosis not present

## 2017-09-25 DIAGNOSIS — D1800 Hemangioma unspecified site: Secondary | ICD-10-CM | POA: Diagnosis not present

## 2017-09-30 ENCOUNTER — Ambulatory Visit: Payer: PPO | Admitting: Physical Therapy

## 2017-10-04 ENCOUNTER — Ambulatory Visit
Admission: RE | Admit: 2017-10-04 | Discharge: 2017-10-04 | Disposition: A | Discharge status: Home / Self Care | Payer: PPO | Source: Ambulatory Visit | Attending: Family Medicine | Admitting: Family Medicine

## 2017-10-04 DIAGNOSIS — Z1231 Encounter for screening mammogram for malignant neoplasm of breast: Secondary | ICD-10-CM | POA: Diagnosis not present

## 2017-10-08 ENCOUNTER — Encounter: Payer: Self-pay | Admitting: Physical Therapy

## 2017-10-08 ENCOUNTER — Ambulatory Visit: Payer: PPO | Attending: Family Medicine | Admitting: Physical Therapy

## 2017-10-08 ENCOUNTER — Other Ambulatory Visit: Payer: Self-pay

## 2017-10-08 DIAGNOSIS — R262 Difficulty in walking, not elsewhere classified: Secondary | ICD-10-CM | POA: Insufficient documentation

## 2017-10-08 DIAGNOSIS — M79605 Pain in left leg: Secondary | ICD-10-CM | POA: Insufficient documentation

## 2017-10-08 NOTE — Therapy (Signed)
[facility] [place], [place], [address] Phone: [phone]   Fax:  [phone]  Physical Therapy Evaluation  Patient Details  Name: Rhonda [name] MRN: [id number] Date of Birth: 1932-07-03 Referring Provider: Vonna Kotyk Dettinger MD   Encounter Date: 10/08/2017  PT End of Session - 10/08/17 1159    Visit Number  1    Number of Visits  16    Date for PT Re-Evaluation  01/06/18    PT Start Time  0945    PT Stop Time  1033    PT Time Calculation (min)  48 min       Past Medical History:  Diagnosis Date  . Arthritis   . Atrial fibrillation ([place]) 05/2015  . Osteopenia   . Syncope 04/2014    Past Surgical History:  Procedure Laterality Date  . BREAST EXCISIONAL BIOPSY Left   . BREAST EXCISIONAL BIOPSY Right   . COLONOSCOPY  2011  . KNEE ARTHROCENTESIS Bilateral   . POLYPECTOMY  11-01-2009  . SHOULDER SURGERY Left   . TOTAL KNEE ARTHROPLASTY Left   . VAGINAL HYSTERECTOMY     twice - vaginal and then oopherectomy    There were no vitals filed for this visit.   Subjective Assessment - 10/08/17 1208    Subjective  The patient reports waking with severe pain about 6 weeks about radiating into her right lateral thigh.  Today her pain is rated at 6/10 with pain increasing when getting up from being seated for awhile.  Tylenol and Biofreeze decreases her pain.    Pertinent History  Right knee pain.    Patient Stated Goals  Get out of pain.    Currently in Pain?  Yes    Pain Score  6     Pain Location  Leg    Pain Orientation  Right    Pain Descriptors / Indicators  Aching;Sharp    Pain Type  Acute pain    Pain Onset  More than a month ago    Pain Frequency  Constant    Aggravating Factors   See above.    Pain Relieving Factors  See above.         [facility] PT Assessment - 10/08/17 0001      Assessment   Medical Diagnosis  IT band syndrome, right.    Referring Provider  Caryl Pina MD    Onset Date/Surgical  Date  -- ~6 weeks.      Precautions   Precautions  None      Restrictions   Weight Bearing Restrictions  No      Balance Screen   Has the patient fallen in the past 6 months  No    Has the patient had a decrease in activity level because of a fear of falling?   Yes    Is the patient reluctant to leave their home because of a fear of falling?   No      Home Film/video editor residence      Prior Function   Level of Independence  Independent      Posture/Postural Control   Posture/Postural Control  Postural limitations    Postural Limitations  Flexed trunk      ROM / Strength   AROM / PROM / Strength  AROM;Strength      AROM   Overall AROM Comments  -10 degrees of right knee extension.      Strength   Overall Strength  Comments  Right hip abduction = 4 to 4+/5.      Palpation   Palpation comment  Tender to palpation over right SIJ and from mid-portion of right ITB to knee.      Special Tests    Special Tests  -- (-) right Ober test.    Other special tests  (-) RT FABER test. Absent LE DTR's.      Ambulation/Gait   Gait Comments  Antalgic gait due to right knee pain.                Objective measurements completed on examination: See above findings.      [facility] Adult PT Treatment/Exercise - 10/08/17 0001      Modalities   Modalities  Electrical Stimulation      Electrical Stimulation   Electrical Stimulation Location  Right SIj and right affected ITB.    Electrical Stimulation Action  Pre-mod to both areas.    Electrical Stimulation Parameters  80-150 Hz x 20 minutes.    Electrical Stimulation Goals  Pain             PT Education - 10/08/17 1237    Education Details  Sleep with pillows between knees to decrease strain on SIJ.    Person(s) Educated  Patient    Methods  Explanation;Demonstration    Comprehension  Verbalized understanding       PT Short Term Goals - 10/08/17 1236      PT SHORT TERM GOAL #1   Title   STG's=LTG's.        PT Long Term Goals - 10/08/17 1236      PT LONG TERM GOAL #1   Title  Independent with a HEP.    Time  8    Period  Weeks    Status  New      PT LONG TERM GOAL #2   Title  Transition from sit to stand with pain not >2-3/10.    Time  8    Period  Weeks    Status  New      PT LONG TERM GOAL #3   Title  Perform ADL's with pain not > 3/10.    Time  8    Period  Weeks    Status  New             Plan - 10/08/17 1227    Clinical Impression Statement  The patient presents to OPPT with c/o right lateral thigh pain.  She woke one morning about 6 weeks ago with severe pain.  She was found to have right SIJ pain and she was very tender over her right lateral thigh (mid-portion of ITB to knee).  She has an antalgic gait pattern due to right knee pain.  She has an Orthopedic consult next month.  Her pain and deficts have impaire dher functional mobility.  Patient will benefit from skilled physical therapy intervention.    History and Personal Factors relevant to plan of care:  Chronic right knee pain.  Osteopenia.    Clinical Presentation  Evolving    Clinical Presentation due to:  Not improving.    Clinical Decision Making  Low    Rehab Potential  Good    PT Frequency  2x / week    PT Duration  8 weeks    PT Treatment/Interventions  ADLs/Self Care Home Management;Cryotherapy;Electrical Stimulation;Ultrasound;Moist Heat;Iontophoresis 4mg /ml Dexamethasone;Therapeutic activities;Therapeutic exercise;Patient/family education;Manual techniques;Passive range of motion;Vasopneumatic Device    PT Next Visit Plan  Please perform STW/M (right SIJ) to include gentle IASTM to right ITB, combo e'stim/U/S; e'stim; right hip abduction strengthening.    Consulted and Agree with Plan of Care  Patient       Patient will benefit from skilled therapeutic intervention in order to improve the following deficits and impairments:  Abnormal gait, Decreased activity tolerance, Decreased  strength, Decreased range of motion, Pain  Visit Diagnosis: Pain in left leg - Plan: PT plan of care cert/re-cert     Problem List Patient Active Problem List   Diagnosis Date Noted  . Overweight (BMI 25.0-29.9) 09/19/2017  . Low serum vitamin D 05/24/2016  . Osteopenia 05/23/2016  . Atrial fibrillation ([place]) 07/13/2015  . Syncope 05/05/2014    Tanush Drees, [place] MPT 10/08/2017, 12:43 PM  [facility] [place], [place], [address] Phone: [phone]   Fax:  [phone]  Name: Rhonda [name] MRN: [id number] Date of Birth: 25-Mar-1933

## 2017-10-09 ENCOUNTER — Other Ambulatory Visit: Payer: Self-pay | Admitting: Cardiology

## 2017-10-10 DIAGNOSIS — M1711 Unilateral primary osteoarthritis, right knee: Secondary | ICD-10-CM | POA: Diagnosis not present

## 2017-10-11 ENCOUNTER — Ambulatory Visit: Payer: PPO | Admitting: Physical Therapy

## 2017-10-11 DIAGNOSIS — M79605 Pain in left leg: Secondary | ICD-10-CM | POA: Diagnosis not present

## 2017-10-11 DIAGNOSIS — R262 Difficulty in walking, not elsewhere classified: Secondary | ICD-10-CM

## 2017-10-11 NOTE — Therapy (Signed)
[facility] [place], [place], [address] Phone: [phone]   Fax:  [phone]  Physical Therapy Treatment  Patient Details  Name: Rhonda [name] MRN: [id number] Date of Birth: 1932/08/24 Referring Provider: Vonna Kotyk Dettinger MD   Encounter Date: 10/11/2017  PT End of Session - 10/11/17 1037    Visit Number  2    Number of Visits  16    Date for PT Re-Evaluation  01/06/18    PT Start Time  0945    PT Stop Time  1038    PT Time Calculation (min)  53 min    Activity Tolerance  Patient tolerated treatment well    Behavior During Therapy  [facility] for tasks assessed/performed       Past Medical History:  Diagnosis Date  . Arthritis   . Atrial fibrillation ([place]) 05/2015  . Osteopenia   . Syncope 04/2014    Past Surgical History:  Procedure Laterality Date  . BREAST EXCISIONAL BIOPSY Left   . BREAST EXCISIONAL BIOPSY Right   . COLONOSCOPY  2011  . KNEE ARTHROCENTESIS Bilateral   . POLYPECTOMY  11-01-2009  . SHOULDER SURGERY Left   . TOTAL KNEE ARTHROPLASTY Left   . VAGINAL HYSTERECTOMY     twice - vaginal and then oopherectomy    There were no vitals filed for this visit.  Subjective Assessment - 10/11/17 1102    Subjective  Pt arriving to therapy reporting R lateral thigh pain. Pt reporting difficulty with walking and pain worsens after she has been sitting a long time and first gets up.     Pertinent History  Right knee pain.    Patient Stated Goals  Get out of pain.    Currently in Pain?  Yes    Pain Score  5     Pain Location  Leg    Pain Orientation  Right    Pain Descriptors / Indicators  Aching;Sore    Pain Type  Acute pain    Pain Onset  More than a month ago    Pain Frequency  Constant                       [facility] Adult PT Treatment/Exercise - 10/11/17 0001      Ambulation/Gait   Gait Comments  Antalgic gait due to right knee pain.      Exercises   Exercises  Knee/Hip      Knee/Hip Exercises: Stretches   Active Hamstring Stretch  2 reps;30 seconds    ITB Stretch  Right;3 reps;30 seconds    Piriformis Stretch  Right;2 reps;20 seconds      Knee/Hip Exercises: Aerobic   Nustep  4 minutes L3      Modalities   Modalities  Electrical Stimulation      Electrical Stimulation   Electrical Stimulation Location  R IT band    Electrical Stimulation Action  pre-mod    Electrical Stimulation Parameters  80-150 Hz x 15 minutes    Electrical Stimulation Goals  Pain      Manual Therapy   Manual Therapy  Soft tissue mobilization    Manual therapy comments  attempted rolling IT band, but pt couldn't tolerate due to sensitivity, STW performed along IT band by therapist, pt was instructed in self soft tissue mobilizations with tennis ball             PT Education - 10/11/17 1037    Education Details  It band  stretch    Person(s) Educated  Patient    Methods  Explanation;Handout;Demonstration    Comprehension  Verbalized understanding;Returned demonstration       PT Short Term Goals - 10/08/17 1236      PT SHORT TERM GOAL #1   Title  STG's=LTG's.        PT Long Term Goals - 10/11/17 1038      PT LONG TERM GOAL #1   Title  Independent with a HEP.    Time  8    Period  Weeks    Status  On-going      PT LONG TERM GOAL #2   Title  Transition from sit to stand with pain not >2-3/10.    Time  8    Period  Weeks    Status  New      PT LONG TERM GOAL #3   Title  Perform ADL's with pain not > 3/10.    Time  8    Period  Weeks    Status  New            Plan - 10/11/17 1100    Clinical Impression Statement  Pt arriving to therapy reporting 5/10 pain in her R lateral thigh. Pt with tenderness over R IT band with increased sensitivity to light touch. Pt was instructed in IT band stretching with handout issued. Pt required gentle progression of Manual therpay today with E-stim and moist heat. At end of session pt reporting decreased pain. Continue  skilled PT to progress toward pt's LTG's set.     Rehab Potential  Good    PT Frequency  2x / week    PT Duration  8 weeks    PT Treatment/Interventions  ADLs/Self Care Home Management;Cryotherapy;Electrical Stimulation;Ultrasound;Moist Heat;Iontophoresis 4mg /ml Dexamethasone;Therapeutic activities;Therapeutic exercise;Patient/family education;Manual techniques;Passive range of motion;Vasopneumatic Device    PT Next Visit Plan  Please perform STW/M (right SIJ) to include gentle IASTM to right ITB if pt can tolerate, combo e'stim/U/S; e'stim; right hip abduction strengthening.    PT Home Exercise Plan  IT band stretch    Consulted and Agree with Plan of Care  Patient       Patient will benefit from skilled therapeutic intervention in order to improve the following deficits and impairments:  Abnormal gait, Decreased activity tolerance, Decreased strength, Decreased range of motion, Pain  Visit Diagnosis: Pain in left leg  Difficulty in walking, not elsewhere classified     Problem List Patient Active Problem List   Diagnosis Date Noted  . Overweight (BMI 25.0-29.9) 09/19/2017  . Low serum vitamin D 05/24/2016  . Osteopenia 05/23/2016  . Atrial fibrillation ([place]) 07/13/2015  . Syncope 05/05/2014    Oretha Caprice, MPT 10/11/2017, 11:03 AM  [facility] [address] [place], [place], [address] Phone: [phone]   Fax:  [phone]  Name: Rhonda [name] MRN: [id number] Date of Birth: 04/13/1933

## 2017-10-15 ENCOUNTER — Ambulatory Visit: Payer: PPO | Admitting: Physical Therapy

## 2017-10-15 DIAGNOSIS — M79605 Pain in left leg: Secondary | ICD-10-CM

## 2017-10-15 DIAGNOSIS — R262 Difficulty in walking, not elsewhere classified: Secondary | ICD-10-CM

## 2017-10-15 NOTE — Therapy (Signed)
[facility] [place], [place], [address] Phone: [phone]   Fax:  [phone]  Physical Therapy Treatment  Patient Details  Name: Rhonda [name] MRN: [id number] Date of Birth: [birth year] Referring Provider: Vonna Kotyk Dettinger MD   Encounter Date: 10/15/2017  PT End of Session - 10/15/17 0944    Visit Number  3    Number of Visits  16    Date for PT Re-Evaluation  01/06/18    PT Start Time  0945    PT Stop Time  1038    PT Time Calculation (min)  53 min    Activity Tolerance  Patient tolerated treatment well    Behavior During Therapy  [facility] for tasks assessed/performed       Past Medical History:  Diagnosis Date  . Arthritis   . Atrial fibrillation ([place]) 05/2015  . Osteopenia   . Syncope 04/2014    Past Surgical History:  Procedure Laterality Date  . BREAST EXCISIONAL BIOPSY Left   . BREAST EXCISIONAL BIOPSY Right   . COLONOSCOPY  2011  . KNEE ARTHROCENTESIS Bilateral   . POLYPECTOMY  11-01-2009  . SHOULDER SURGERY Left   . TOTAL KNEE ARTHROPLASTY Left   . VAGINAL HYSTERECTOMY     twice - vaginal and then oopherectomy    There were no vitals filed for this visit.  Subjective Assessment - 10/15/17 0946    Subjective  Patient states her pain is a little better although she feels bad. She had a bad night and she had an episode with Afib over the weeknend which wears her out.  She is compliant with HEP.    Pertinent History  Right knee pain.    Patient Stated Goals  Get out of pain.    Currently in Pain?  Yes    Pain Score  3     Pain Location  Leg    Pain Orientation  Right                       [facility] Adult PT Treatment/Exercise - 10/15/17 0001      Modalities   Modalities  Electrical Stimulation;Moist Heat;Ultrasound      Moist Heat Therapy   Number Minutes Moist Heat  15 Minutes    Moist Heat Location  Hip;Other (comment) leg      Electrical Stimulation   Electrical Stimulation  Location  R piriformis and ITB premod 80-150 Hz x 15 min     Electrical Stimulation Goals  Pain      Ultrasound   Ultrasound Location  piriformis    Ultrasound Parameters  1.5 W/cm2 1 mhz cont x 10 min    Ultrasound Goals  Pain      Manual Therapy   Manual Therapy  Soft tissue mobilization;Myofascial release    Soft tissue mobilization  gentle to R hip and ITB    Myofascial Release  TPR to R piriformis with active clam also               PT Short Term Goals - 10/08/17 1236      PT SHORT TERM GOAL #1   Title  STG's=LTG's.        PT Long Term Goals - 10/15/17 1216      PT LONG TERM GOAL #1   Title  Independent with a HEP.    Time  8    Period  Weeks    Status  On-going  PT LONG TERM GOAL #2   Title  Transition from sit to stand with pain not >2-3/10.    Time  8    Period  Weeks    Status  On-going      PT LONG TERM GOAL #3   Title  Perform ADL's with pain not > 3/10.    Time  8    Period  Weeks    Status  On-going            Plan - 10/15/17 1212    Clinical Impression Statement  Patient continues to have pain in R lateral thigh. She has multiple trigger points in R gluteals, piriformis and along lateral quads and HS but is very sensitive to manual therapy. Patient would benefit from dry needling to release trigger points.     PT Frequency  2x / week    PT Duration  8 weeks    PT Treatment/Interventions  ADLs/Self Care Home Management;Cryotherapy;Electrical Stimulation;Ultrasound;Moist Heat;Iontophoresis 4mg /ml Dexamethasone;Therapeutic activities;Therapeutic exercise;Patient/family education;Manual techniques;Passive range of motion;Vasopneumatic Device;Dry needling    PT Next Visit Plan  DN to R gluteals, piriformis, lateral quads and HS if approved by MD (recert sent 0/34/91). Please perform STW/M (right SIJ) to include gentle IASTM to right ITB if pt can tolerate, combo e'stim/U/S; e'stim; right hip abduction strengthening.    PT Home Exercise Plan   IT band stretch    Consulted and Agree with Plan of Care  Patient       Patient will benefit from skilled therapeutic intervention in order to improve the following deficits and impairments:  Abnormal gait, Decreased activity tolerance, Decreased strength, Decreased range of motion, Pain  Visit Diagnosis: Difficulty in walking, not elsewhere classified - Plan: PT plan of care cert/re-cert  Pain in left leg - Plan: PT plan of care cert/re-cert     Problem List Patient Active Problem List   Diagnosis Date Noted  . Overweight (BMI 25.0-29.9) 09/19/2017  . Low serum vitamin D 05/24/2016  . Osteopenia 05/23/2016  . Atrial fibrillation ([place]) 07/13/2015  . Syncope 05/05/2014    ,Taiylor Virden PT 10/15/2017, 12:19 PM  [facility] [address] [place], [place], [address] Phone: [phone]   Fax:  [phone]  Name: Rhonda [name] MRN: [id number] Date of Birth: 1932-11-14

## 2017-10-15 NOTE — Patient Instructions (Signed)
Trigger Point Dry Needling  . What is Trigger Point Dry Needling (DN)? o DN is a physical therapy technique used to treat muscle pain and dysfunction. Specifically, DN helps deactivate muscle trigger points (muscle knots).  o A thin filiform needle is used to penetrate the skin and stimulate the underlying trigger point. The goal is for a local twitch response (LTR) to occur and for the trigger point to relax. No medication of any kind is injected during the procedure.   . What Does Trigger Point Dry Needling Feel Like?  o The procedure feels different for each individual patient. Some patients report that they do not actually feel the needle enter the skin and overall the process is not painful. Very mild bleeding may occur. However, many patients feel a deep cramping in the muscle in which the needle was inserted. This is the local twitch response.   Marland Kitchen How Will I feel after the treatment? o Soreness is normal, and the onset of soreness may not occur for a few hours. Typically this soreness does not last longer than two days.  o Bruising is uncommon, however; ice can be used to decrease any possible bruising.  o In rare cases feeling tired or nauseous after the treatment is normal. In addition, your symptoms may get worse before they get better, this period will typically not last longer than 24 hours.   . What Can I do After My Treatment? o Increase your hydration by drinking more water for the next 24 hours. o You may place ice or heat on the areas treated that have become sore, however, do not use heat on inflamed or bruised areas. Heat often brings more relief post needling. o You can continue your regular activities, but vigorous activity is not recommended initially after the treatment for 24 hours. o DN is best combined with other physical therapy such as strengthening, stretching, and other therapies.    Precautions:  In some cases, dry needling is done over the lung field. While rare,  there is a risk of pneumothorax (punctured lung). Because of this, if you ever experience shortness of breath on exertion, difficulty taking a deep breath, chest pain or a dry cough following dry needling, you should report to an emergency room and tell them that you have been dry needled over the thorax.   Madelyn Flavors, PT 10/15/17 10:24 AM; [facility] [place], [place], [address] Phone: [phone]   Fax:  [phone]

## 2017-10-17 ENCOUNTER — Encounter: Payer: Self-pay | Admitting: Physical Therapy

## 2017-10-17 ENCOUNTER — Ambulatory Visit: Payer: PPO | Admitting: Physical Therapy

## 2017-10-17 DIAGNOSIS — R262 Difficulty in walking, not elsewhere classified: Secondary | ICD-10-CM

## 2017-10-17 DIAGNOSIS — M79605 Pain in left leg: Secondary | ICD-10-CM | POA: Diagnosis not present

## 2017-10-17 NOTE — Therapy (Signed)
[facility] [place], [place], [address] Phone: [phone]   Fax:  [phone]  Physical Therapy Treatment  Patient Details  Name: Rhonda [name] MRN: [id number] Date of Birth: 09/14/1932 Referring Provider: Vonna Kotyk Dettinger MD   Encounter Date: 10/17/2017  PT End of Session - 10/17/17 0900    Visit Number  4    Number of Visits  16    Date for PT Re-Evaluation  01/06/18    PT Start Time  0815    PT Stop Time  0911    PT Time Calculation (min)  56 min    Activity Tolerance  Patient tolerated treatment well    Behavior During Therapy  [name]       Past Medical History:  Diagnosis Date  . Arthritis   . Atrial fibrillation ([place]) 05/2015  . Osteopenia   . Syncope 04/2014    Past Surgical History:  Procedure Laterality Date  . BREAST EXCISIONAL BIOPSY Left   . BREAST EXCISIONAL BIOPSY Right   . COLONOSCOPY  2011  . KNEE ARTHROCENTESIS Bilateral   . POLYPECTOMY  11-01-2009  . SHOULDER SURGERY Left   . TOTAL KNEE ARTHROPLASTY Left   . VAGINAL HYSTERECTOMY     twice - vaginal and then oopherectomy    There were no vitals filed for this visit.  Subjective Assessment - 10/17/17 0859    Subjective  Patient reported last visit's treatment made her feel much better. Patient reported she does not want to try dry needling at this time due to taking blood thinners.    Pertinent History  Right knee pain.    Patient Stated Goals  Get out of pain.    Currently in Pain?  Yes    Pain Score  2     Pain Location  Leg    Pain Orientation  Right    Pain Descriptors / Indicators  Aching;Sore    Pain Type  Acute pain    Pain Onset  More than a month ago    Pain Frequency  Constant         [facility] PT Assessment - 10/17/17 0001      Assessment   Medical Diagnosis  IT band syndrome, right.                   [place] Adult PT Treatment/Exercise - 10/17/17 0001      Exercises   Exercises   Knee/Hip      Modalities   Modalities  Electrical Stimulation;Moist Heat;Ultrasound      Moist Heat Therapy   Number Minutes Moist Heat  15 Minutes    Moist Heat Location  Hip;Other (comment) ITB      Electrical Stimulation   Electrical Stimulation Location  R piriformis and ITB    Electrical Stimulation Action  Pre-mod 2 channels    Electrical Stimulation Parameters  80-150 hz x15 min    Electrical Stimulation Goals  Pain      Ultrasound   Ultrasound Location  R piriformis    Ultrasound Parameters  1.5 w/cm2 100%, 25mhz    Ultrasound Goals  Pain      Manual Therapy   Manual Therapy  Soft tissue mobilization;Myofascial release    Soft tissue mobilization  gentle to R hip and ITB               PT Short Term Goals - 10/08/17 1236      PT SHORT TERM GOAL #1  Title  STG's=LTG's.        PT Long Term Goals - 10/15/17 1216      PT LONG TERM GOAL #1   Title  Independent with a HEP.    Time  8    Period  Weeks    Status  On-going      PT LONG TERM GOAL #2   Title  Transition from sit to stand with pain not >2-3/10.    Time  8    Period  Weeks    Status  On-going      PT LONG TERM GOAL #3   Title  Perform ADL's with pain not > 3/10.    Time  8    Period  Weeks    Status  On-going            Plan - 10/17/17 0905    Clinical Impression Statement  Patient was able to tolerate treatment well with no increase of pain. Patient noted with good response to light STW/M to ITB; patient reported feeling a more uncomfortable with more pressure with STW/M. Normal response to modalities at end of session.     Clinical Presentation  Evolving    Clinical Decision Making  Low    Rehab Potential  Good    PT Frequency  2x / week    PT Duration  8 weeks    PT Treatment/Interventions  ADLs/Self Care Home Management;Cryotherapy;Electrical Stimulation;Ultrasound;Moist Heat;Iontophoresis 4mg /ml Dexamethasone;Therapeutic activities;Therapeutic exercise;Patient/family  education;Manual techniques;Passive range of motion;Vasopneumatic Device;Dry needling    PT Next Visit Plan  DN to R gluteals, piriformis, lateral quads and HS if approved by MD (recert sent 6/76/72). Please perform STW/M (right SIJ) to include gentle IASTM to right ITB if pt can tolerate, combo e'stim/U/S; e'stim; right hip abduction strengthening.    Consulted and Agree with Plan of Care  Patient       Patient will benefit from skilled therapeutic intervention in order to improve the following deficits and impairments:  Abnormal gait, Decreased activity tolerance, Decreased strength, Decreased range of motion, Pain  Visit Diagnosis: Difficulty in walking, not elsewhere classified  Pain in left leg     Problem List Patient Active Problem List   Diagnosis Date Noted  . Overweight (BMI 25.0-29.9) 09/19/2017  . Low serum vitamin D 05/24/2016  . Osteopenia 05/23/2016  . Atrial fibrillation ([place]) 07/13/2015  . Syncope 05/05/2014   Gabriela Eves, PT, DPT 10/17/2017, 12:34 PM  [facility] [address] [place], [place], [address] Phone: [phone]   Fax:  [phone]  Name: Rhonda [name] MRN: [id number] Date of Birth: Sep 20, 1932

## 2017-10-22 ENCOUNTER — Ambulatory Visit: Payer: PPO | Attending: Family Medicine | Admitting: Physical Therapy

## 2017-10-22 DIAGNOSIS — M79605 Pain in left leg: Secondary | ICD-10-CM | POA: Insufficient documentation

## 2017-10-22 DIAGNOSIS — R262 Difficulty in walking, not elsewhere classified: Secondary | ICD-10-CM | POA: Diagnosis not present

## 2017-10-22 NOTE — Therapy (Signed)
[facility] [place], [place], [address] Phone: [phone]   Fax:  [phone]  Physical Therapy Treatment  Patient Details  Name: Rhonda [name] MRN: [id number] Date of Birth: 1933-03-12 Referring Provider: Vonna Kotyk Dettinger MD   Encounter Date: 10/22/2017  PT End of Session - 10/22/17 0831    Visit Number  5    Number of Visits  16    Date for PT Re-Evaluation  01/06/18    PT Start Time  0815    PT Stop Time  0911    PT Time Calculation (min)  56 min    Activity Tolerance  Patient tolerated treatment well    Behavior During Therapy  [facility] for tasks assessed/performed       Past Medical History:  Diagnosis Date  . Arthritis   . Atrial fibrillation ([place]) 05/2015  . Osteopenia   . Syncope 04/2014    Past Surgical History:  Procedure Laterality Date  . BREAST EXCISIONAL BIOPSY Left   . BREAST EXCISIONAL BIOPSY Right   . COLONOSCOPY  2011  . KNEE ARTHROCENTESIS Bilateral   . POLYPECTOMY  11-01-2009  . SHOULDER SURGERY Left   . TOTAL KNEE ARTHROPLASTY Left   . VAGINAL HYSTERECTOMY     twice - vaginal and then oopherectomy    There were no vitals filed for this visit.  Subjective Assessment - 10/22/17 0915    Subjective  Patient reported feeling much better. Pain is currently at a 1/10    Pertinent History  Right knee pain.    Patient Stated Goals  Get out of pain.    Currently in Pain?  Yes    Pain Score  1     Pain Location  Leg    Pain Orientation  Right    Pain Descriptors / Indicators  Aching;Sore    Pain Type  Acute pain    Pain Onset  More than a month ago         [facility] PT Assessment - 10/22/17 0001      Assessment   Medical Diagnosis  IT band syndrome, right.                   [place] Adult PT Treatment/Exercise - 10/22/17 0001      Knee/Hip Exercises: Aerobic   Nustep  Level 2 x10 minutes; seat 11      Modalities   Modalities  Electrical Stimulation;Moist Heat;Ultrasound      Moist Heat Therapy   Number Minutes Moist Heat  15 Minutes    Moist Heat Location  Lumbar Spine leg      Electrical Stimulation   Electrical Stimulation Location  R QL and ITB    Electrical Stimulation Action  pre mod 2 channels    Electrical Stimulation Parameters  80-150 hz x15 min    Electrical Stimulation Goals  Pain      Ultrasound   Ultrasound Location  R QL    Ultrasound Parameters  1.5 w/cm2 100% 8mHz x10 min    Ultrasound Goals  Pain      Manual Therapy   Manual Therapy  Soft tissue mobilization;Myofascial release    Soft tissue mobilization  gentle to R hip and ITB               PT Short Term Goals - 10/08/17 1236      PT SHORT TERM GOAL #1   Title  STG's=LTG's.        PT Long Term  Goals - 10/15/17 1216      PT LONG TERM GOAL #1   Title  Independent with a HEP.    Time  8    Period  Weeks    Status  On-going      PT LONG TERM GOAL #2   Title  Transition from sit to stand with pain not >2-3/10.    Time  8    Period  Weeks    Status  On-going      PT LONG TERM GOAL #3   Title  Perform ADL's with pain not > 3/10.    Time  8    Period  Weeks    Status  On-going            Plan - 10/22/17 0904    Clinical Impression Statement  Patient was able to tolerate treatment well despite reports of right knee pain during nustep. Seat adjusted and patient reported decrease of pain. Patient reported tenderness to STW/M to ITB. Patient and PT discussed plan of care for next two weeks with emphasis on HEP to strengthen muscles and build endurance. Patient reported agreement and understanding. Normal response to modalities at end of session.     Clinical Presentation  Evolving    Clinical Decision Making  Low    Rehab Potential  Good    PT Frequency  2x / week    PT Duration  8 weeks    PT Treatment/Interventions  ADLs/Self Care Home Management;Cryotherapy;Electrical Stimulation;Ultrasound;Moist Heat;Iontophoresis 4mg /ml Dexamethasone;Therapeutic  activities;Therapeutic exercise;Patient/family education;Manual techniques;Passive range of motion;Vasopneumatic Device;Dry needling    PT Next Visit Plan  piriformis, lateral quads and HS if approved by MD (recert sent 9/62/22). Please perform STW/M (right SIJ) to include gentle IASTM to right ITB if pt can tolerate, combo e'stim/U/S; e'stim; right hip abduction strengthening.    PT Home Exercise Plan  IT band stretch    Consulted and Agree with Plan of Care  Patient       Patient will benefit from skilled therapeutic intervention in order to improve the following deficits and impairments:  Abnormal gait, Decreased activity tolerance, Decreased strength, Decreased range of motion, Pain  Visit Diagnosis: Difficulty in walking, not elsewhere classified     Problem List Patient Active Problem List   Diagnosis Date Noted  . Overweight (BMI 25.0-29.9) 09/19/2017  . Low serum vitamin D 05/24/2016  . Osteopenia 05/23/2016  . Atrial fibrillation ([place]) 07/13/2015  . Syncope 05/05/2014   Gabriela Eves, PT, DPT 10/22/2017, 9:57 AM  [facility] [address] [place], [place], [address] Phone: [phone]   Fax:  [phone]  Name: Rhonda [name] MRN: [id number] Date of Birth: 02-05-1933

## 2017-10-25 ENCOUNTER — Ambulatory Visit: Payer: PPO | Admitting: Physical Therapy

## 2017-10-25 ENCOUNTER — Encounter: Payer: Self-pay | Admitting: Physical Therapy

## 2017-10-25 DIAGNOSIS — R262 Difficulty in walking, not elsewhere classified: Secondary | ICD-10-CM | POA: Diagnosis not present

## 2017-10-25 DIAGNOSIS — M79605 Pain in left leg: Secondary | ICD-10-CM

## 2017-10-25 NOTE — Therapy (Signed)
[facility] [place], [place], [address] Phone: [phone]   Fax:  [phone]  Physical Therapy Treatment  Patient Details  Name: Rhonda [name] MRN: [id number] Date of Birth: 01-Apr-1933 Referring Provider: Vonna Kotyk Dettinger MD   Encounter Date: 10/25/2017  PT End of Session - 10/25/17 0826    Visit Number  6    Number of Visits  16    Date for PT Re-Evaluation  01/06/18    PT Start Time  0815    PT Stop Time  0910    PT Time Calculation (min)  55 min    Activity Tolerance  Patient tolerated treatment well    Behavior During Therapy  [facility] for tasks assessed/performed       Past Medical History:  Diagnosis Date  . Arthritis   . Atrial fibrillation ([place]) 05/2015  . Osteopenia   . Syncope 04/2014    Past Surgical History:  Procedure Laterality Date  . BREAST EXCISIONAL BIOPSY Left   . BREAST EXCISIONAL BIOPSY Right   . COLONOSCOPY  2011  . KNEE ARTHROCENTESIS Bilateral   . POLYPECTOMY  11-01-2009  . SHOULDER SURGERY Left   . TOTAL KNEE ARTHROPLASTY Left   . VAGINAL HYSTERECTOMY     twice - vaginal and then oopherectomy    There were no vitals filed for this visit.  Subjective Assessment - 10/25/17 1150    Subjective  Patient reported pain is much better. Her ITB pain is more distal today.    Patient Stated Goals  Get out of pain.    Currently in Pain?  Yes    Pain Score  1     Pain Location  Leg    Pain Orientation  Right    Pain Descriptors / Indicators  Sore    Pain Type  Acute pain    Pain Onset  More than a month ago                       [facility] Adult PT Treatment/Exercise - 10/25/17 0001      Knee/Hip Exercises: Aerobic   Nustep  Level 2 x7 minutes; seat 11      Modalities   Modalities  Electrical Stimulation;Moist Heat;Ultrasound      Moist Heat Therapy   Number Minutes Moist Heat  10 Minutes    Moist Heat Location  Lumbar Spine      Electrical Stimulation   Electrical  Stimulation Location  R QL and ITB    Electrical Stimulation Action  pre-mod 2 channels    Electrical Stimulation Parameters  80-150 hz x10 min    Electrical Stimulation Goals  Pain      Ultrasound   Ultrasound Location  R QL    Ultrasound Parameters  1.5 w/cm2 100% 22mhz x10 min    Ultrasound Goals  Pain      Manual Therapy   Manual Therapy  Soft tissue mobilization;Myofascial release    Soft tissue mobilization  gentle right distal ITB             PT Education - 10/25/17 1152    Education Details  lower trunk rotations     Person(s) Educated  Patient    Methods  Explanation;Demonstration    Comprehension  Verbalized understanding;Returned demonstration       PT Short Term Goals - 10/08/17 1236      PT SHORT TERM GOAL #1   Title  STG's=LTG's.  PT Long Term Goals - 10/15/17 1216      PT LONG TERM GOAL #1   Title  Independent with a HEP.    Time  8    Period  Weeks    Status  On-going      PT LONG TERM GOAL #2   Title  Transition from sit to stand with pain not >2-3/10.    Time  8    Period  Weeks    Status  On-going      PT LONG TERM GOAL #3   Title  Perform ADL's with pain not > 3/10.    Time  8    Period  Weeks    Status  On-going            Plan - 10/25/17 1154    Clinical Impression Statement  Patient was able to tolerate treatment with minimal reports of pain. Patient reported slight right knee irritation with nustep and was only performed for 7 minutes. Patient continues to tolerate only gentle STW to distal ITB. No adverse affects noted upon removal.     Clinical Presentation  Evolving    Clinical Decision Making  Low    Rehab Potential  Good    PT Frequency  2x / week    PT Duration  8 weeks    PT Treatment/Interventions  ADLs/Self Care Home Management;Cryotherapy;Electrical Stimulation;Ultrasound;Moist Heat;Iontophoresis 4mg /ml Dexamethasone;Therapeutic activities;Therapeutic exercise;Patient/family education;Manual  techniques;Passive range of motion;Vasopneumatic Device;Dry needling    PT Next Visit Plan  cont POC. perform STW/M (right SIJ) to include gentle IASTM to right ITB if pt can tolerate, combo e'stim/U/S; e'stim; right hip abduction strengthening.    Consulted and Agree with Plan of Care  Patient       Patient will benefit from skilled therapeutic intervention in order to improve the following deficits and impairments:  Abnormal gait, Decreased activity tolerance, Decreased strength, Decreased range of motion, Pain  Visit Diagnosis: Difficulty in walking, not elsewhere classified  Pain in left leg     Problem List Patient Active Problem List   Diagnosis Date Noted  . Overweight (BMI 25.0-29.9) 09/19/2017  . Low serum vitamin D 05/24/2016  . Osteopenia 05/23/2016  . Atrial fibrillation ([place]) 07/13/2015  . Syncope 05/05/2014   Gabriela Eves, PT, DPT 10/25/2017, 12:04 PM  [facility] [address] [place], [place], [address] Phone: [phone]   Fax:  [phone]  Name: Rhonda [name] MRN: [id number] Date of Birth: Oct 01, 1932

## 2017-10-29 ENCOUNTER — Ambulatory Visit: Payer: PPO | Admitting: Physical Therapy

## 2017-10-29 DIAGNOSIS — R262 Difficulty in walking, not elsewhere classified: Secondary | ICD-10-CM | POA: Diagnosis not present

## 2017-10-29 DIAGNOSIS — M79605 Pain in left leg: Secondary | ICD-10-CM

## 2017-10-29 NOTE — Therapy (Signed)
[facility] [place], [place], [address] Phone: [phone]   Fax:  [phone]  Physical Therapy Treatment  Patient Details  Name: Rhonda [name] MRN: [id number] Date of Birth: 1932/08/02 Referring Provider: Vonna Kotyk Dettinger MD    Encounter Date: 10/29/2017  PT End of Session - 10/29/17 0818    Visit Number  7    Number of Visits  16    Date for PT Re-Evaluation  01/06/18    PT Start Time  0815    PT Stop Time  0908    PT Time Calculation (min)  53 min    Activity Tolerance  Patient tolerated treatment well    Behavior During Therapy  [facility] for tasks assessed/performed       Past Medical History:  Diagnosis Date  . Arthritis   . Atrial fibrillation ([place]) 05/2015  . Osteopenia   . Syncope 04/2014    Past Surgical History:  Procedure Laterality Date  . BREAST EXCISIONAL BIOPSY Left   . BREAST EXCISIONAL BIOPSY Right   . COLONOSCOPY  2011  . KNEE ARTHROCENTESIS Bilateral   . POLYPECTOMY  11-01-2009  . SHOULDER SURGERY Left   . TOTAL KNEE ARTHROPLASTY Left   . VAGINAL HYSTERECTOMY     twice - vaginal and then oopherectomy    There were no vitals filed for this visit.  Subjective Assessment - 10/29/17 0819    Subjective  Patient reported her right hip and lateral leg is not hurting at all anymore, just her knee and is mostly because of arthritis.    Pertinent History  Right knee pain.    Patient Stated Goals  Get out of pain.    Currently in Pain?  Yes    Pain Score  1     Pain Location  Knee    Pain Orientation  Right         [facility] PT Assessment - 10/29/17 0001      Assessment   Medical Diagnosis  IT band syndrome, right.                   [facility] Adult PT Treatment/Exercise - 10/29/17 0001      Modalities   Modalities  Electrical Stimulation;Moist Heat;Ultrasound      Moist Heat Therapy   Number Minutes Moist Heat  15 Minutes    Moist Heat Location  Lumbar Spine      Electrical  Stimulation   Electrical Stimulation Location  R QL and ITB    Electrical Stimulation Action  pre-mod 2 channes    Electrical Stimulation Parameters  80-150 hz x15 min    Electrical Stimulation Goals  Pain      Ultrasound   Ultrasound Location  R QL    Ultrasound Parameters  1.5 w/cm2 100%, 1 mhz    Ultrasound Goals  Pain      Manual Therapy   Manual Therapy  Soft tissue mobilization;Myofascial release    Soft tissue mobilization  gentle right distal ITB and R QL               PT Short Term Goals - 10/08/17 1236      PT SHORT TERM GOAL #1   Title  STG's=LTG's.        PT Long Term Goals - 10/15/17 1216      PT LONG TERM GOAL #1   Title  Independent with a HEP.    Time  8    Period  Weeks  Status  On-going      PT LONG TERM GOAL #2   Title  Transition from sit to stand with pain not >2-3/10.    Time  8    Period  Weeks    Status  On-going      PT LONG TERM GOAL #3   Title  Perform ADL's with pain not > 3/10.    Time  8    Period  Weeks    Status  On-going            Plan - 10/29/17 5974    Clinical Impression Statement  Patient was able to tolerate treatment with no pain. Patient noted with some reports of discomfort with deep STW/M to R QL but reported decrease of pain when provided with light STW/M. Patient noted with decreased adhesions along R distal ITB. Normal response to modalities upon removal.     Clinical Presentation  Evolving    Clinical Decision Making  Low    Rehab Potential  Good    PT Frequency  2x / week    PT Duration  8 weeks    PT Treatment/Interventions  ADLs/Self Care Home Management;Cryotherapy;Electrical Stimulation;Ultrasound;Moist Heat;Iontophoresis 4mg /ml Dexamethasone;Therapeutic activities;Therapeutic exercise;Patient/family education;Manual techniques;Passive range of motion;Vasopneumatic Device;Dry needling    PT Next Visit Plan  DC next visit     Consulted and Agree with Plan of Care  Patient       Patient will  benefit from skilled therapeutic intervention in order to improve the following deficits and impairments:  Abnormal gait, Decreased activity tolerance, Decreased strength, Decreased range of motion, Pain  Visit Diagnosis: Difficulty in walking, not elsewhere classified  Pain in left leg     Problem List Patient Active Problem List   Diagnosis Date Noted  . Overweight (BMI 25.0-29.9) 09/19/2017  . Low serum vitamin D 05/24/2016  . Osteopenia 05/23/2016  . Atrial fibrillation ([place]) 07/13/2015  . Syncope 05/05/2014    Gabriela Eves, PT, DPT 10/29/2017, 9:20 AM  [facility] [address] [place], [place], [address] Phone: [phone]   Fax:  [phone]  Name: Rhonda [name] MRN: [id number] Date of Birth: [birth year]

## 2017-10-31 ENCOUNTER — Ambulatory Visit: Payer: PPO | Admitting: Physical Therapy

## 2017-10-31 ENCOUNTER — Encounter: Payer: Self-pay | Admitting: Physical Therapy

## 2017-10-31 DIAGNOSIS — R262 Difficulty in walking, not elsewhere classified: Secondary | ICD-10-CM

## 2017-10-31 DIAGNOSIS — M79605 Pain in left leg: Secondary | ICD-10-CM

## 2017-10-31 NOTE — Therapy (Signed)
[facility] [place], [place], [address] Phone: [phone]   Fax:  [phone]  Physical Therapy Treatment/Discharge  Patient Details  Name: Rhonda [name] MRN: [id number] Date of Birth: May 05, 1932 Referring Provider: Vonna Kotyk Dettinger MD   Encounter Date: 10/31/2017  PT End of Session - 10/31/17 1130    Visit Number  8    Number of Visits  16    Date for PT Re-Evaluation  01/06/18    PT Start Time  1030    PT Stop Time  1128    PT Time Calculation (min)  58 min    Activity Tolerance  Patient tolerated treatment well    Behavior During Therapy  [facility] for tasks assessed/performed       Past Medical History:  Diagnosis Date  . Arthritis   . Atrial fibrillation ([place]) 05/2015  . Osteopenia   . Syncope 04/2014    Past Surgical History:  Procedure Laterality Date  . BREAST EXCISIONAL BIOPSY Left   . BREAST EXCISIONAL BIOPSY Right   . COLONOSCOPY  2011  . KNEE ARTHROCENTESIS Bilateral   . POLYPECTOMY  11-01-2009  . SHOULDER SURGERY Left   . TOTAL KNEE ARTHROPLASTY Left   . VAGINAL HYSTERECTOMY     twice - vaginal and then oopherectomy    There were no vitals filed for this visit.  Subjective Assessment - 10/31/17 1548    Subjective  Patient reported she is feeling much better aside from her athritic knee pain.    Pertinent History  Right knee pain.    Patient Stated Goals  Get out of pain.    Currently in Pain?  No/denies         [facility] PT Assessment - 10/31/17 0001      Assessment   Medical Diagnosis  IT band syndrome, right.                   [facility] Adult PT Treatment/Exercise - 10/31/17 0001      Modalities   Modalities  Electrical Stimulation;Moist Heat;Ultrasound      Moist Heat Therapy   Number Minutes Moist Heat  15 Minutes    Moist Heat Location  Lumbar Spine      Electrical Stimulation   Electrical Stimulation Location  R QL and ITB    Electrical Stimulation Action  pre-mod 2  channels    Electrical Stimulation Parameters  80-150 hz x15 min    Electrical Stimulation Goals  Pain      Ultrasound   Ultrasound Location  R QL    Ultrasound Parameters  1.5 w/cm2 100%, 77mz     Ultrasound Goals  Pain      Manual Therapy   Manual Therapy  Soft tissue mobilization;Myofascial release    Soft tissue mobilization  gentle right distal ITB and R QL               PT Short Term Goals - 10/08/17 1236      PT SHORT TERM GOAL #1   Title  STG's=LTG's.        PT Long Term Goals - 10/31/17 1110      PT LONG TERM GOAL #1   Title  Independent with a HEP.    Time  8    Period  Weeks    Status  Achieved      PT LONG TERM GOAL #2   Title  Transition from sit to stand with pain not >2-3/10.    Time  8    Period  Weeks    Status  Achieved no pain      PT LONG TERM GOAL #3   Title  Perform ADL's with pain not > 3/10.    Time  8    Period  Weeks    Status  Achieved            Plan - 10/31/17 1549    Clinical Impression Statement  Patient was able to tolerate treatment well. Patient did not report discomfort with STW/M to distal right ITB. Patient reported an elimination of pain and improvements with ADLs and functional activities, patient to be [place] today. All goals met.    Clinical Presentation  Evolving    Clinical Decision Making  Low    Rehab Potential  Good    PT Frequency  2x / week    PT Duration  8 weeks    PT Treatment/Interventions  ADLs/Self Care Home Management;Cryotherapy;Electrical Stimulation;Ultrasound;Moist Heat;Iontophoresis 53m/ml Dexamethasone;Therapeutic activities;Therapeutic exercise;Patient/family education;Manual techniques;Passive range of motion;Vasopneumatic Device;Dry needling    PT Next Visit Plan  DC    Consulted and Agree with Plan of Care  Patient       Patient will benefit from skilled therapeutic intervention in order to improve the following deficits and impairments:  Abnormal gait, Decreased activity tolerance,  Decreased strength, Decreased range of motion, Pain  Visit Diagnosis: Difficulty in walking, not elsewhere classified  Pain in left leg     Problem List Patient Active Problem List   Diagnosis Date Noted  . Overweight (BMI 25.0-29.9) 09/19/2017  . Low serum vitamin D 05/24/2016  . Osteopenia 05/23/2016  . Atrial fibrillation ([place] 07/13/2015  . Syncope 05/05/2014     PHYSICAL THERAPY DISCHARGE SUMMARY  Visits from Start of Care: 8  Current functional level related to goals / functional outcomes: See above   Remaining deficits: All goals met   Education / Equipment: HEP Plan: Patient agrees to discharge.  Patient goals were met. Patient is being discharged due to meeting the stated rehab goals.  ?????      KGabriela Eves PT, DPT 10/31/2017, 3:51 PM  [facility] [address]Phone: [phone]  Fax:  [phone] Name: Rhonda SIEBENMRN: [id number] of Birth: [birth year]

## 2017-11-04 ENCOUNTER — Other Ambulatory Visit: Payer: Self-pay | Admitting: Family Medicine

## 2017-11-04 DIAGNOSIS — R55 Syncope and collapse: Secondary | ICD-10-CM

## 2017-11-04 DIAGNOSIS — I480 Paroxysmal atrial fibrillation: Secondary | ICD-10-CM

## 2017-11-14 DIAGNOSIS — M1711 Unilateral primary osteoarthritis, right knee: Secondary | ICD-10-CM | POA: Diagnosis not present

## 2017-11-18 ENCOUNTER — Encounter: Payer: Self-pay | Admitting: Family Medicine

## 2017-11-18 ENCOUNTER — Ambulatory Visit (INDEPENDENT_AMBULATORY_CARE_PROVIDER_SITE_OTHER): Payer: PPO | Admitting: Family Medicine

## 2017-11-18 VITALS — BP 137/74 | HR 73 | Temp 98.9°F | Ht 65.0 in | Wt 165.0 lb

## 2017-11-18 DIAGNOSIS — I480 Paroxysmal atrial fibrillation: Secondary | ICD-10-CM

## 2017-11-18 DIAGNOSIS — E663 Overweight: Secondary | ICD-10-CM | POA: Diagnosis not present

## 2017-11-18 DIAGNOSIS — Z23 Encounter for immunization: Secondary | ICD-10-CM | POA: Diagnosis not present

## 2017-11-18 DIAGNOSIS — M8589 Other specified disorders of bone density and structure, multiple sites: Secondary | ICD-10-CM

## 2017-11-18 MED ORDER — ZOLPIDEM TARTRATE 5 MG PO TABS: 5.0000 mg | ORAL_TABLET | Freq: Every evening | ORAL | 5 refills | Status: DC | PRN

## 2017-11-18 NOTE — Addendum Note (Signed)
Addended by: Michaela Corner on: 11/18/2017 08:51 AM   Modules accepted: Orders

## 2017-11-18 NOTE — Progress Notes (Signed)
BP 137/74   Pulse 73   Temp 98.9 F (37.2 C) (Oral)   Ht _0  (1.651 m)   Wt 165 lb (74.8 kg)   BMI 27.46 kg/m    Subjective:    Patient ID: Rhonda [name], female    DOB: 1932/12/27, 84 y.o.   MRN: [id number]  HPI: Rhonda [name] is a 84 y.o. female presenting on 11/18/2017 for Medical Management of Chronic Issues   HPI Paroxysmal A. fib recheck Patient is coming in today for paroxysmal A. fib recheck.  Since we adjusted her medication down, the diltiazem, she has not been having any lightheadedness or dizziness like she was having before.  She has not noted any palpitations or flutters or weakness like she was having before when she was getting those a lot.  She says she is feeling a lot better.  Osteopenia Patient has been diagnosed with osteopenia and is currently taking calcium and vitamin D for this and had a DEXA scan in 2018 and is due for one again next year.  She denies any fractures that she knows of.  Patient is overweight and has a BMI of 27 which has been stable with a little added education on this but likely is not going to change anything currently.  Relevant past medical, surgical, family and social history reviewed and updated as indicated. Interim medical history since our last visit reviewed. Allergies and medications reviewed and updated.  Review of Systems  Constitutional: Negative for chills and fever.  HENT: Negative for congestion, ear discharge and ear pain.   Eyes: Negative for redness and visual disturbance.  Respiratory: Negative for chest tightness and shortness of breath.   Cardiovascular: Negative for chest pain and leg swelling.  Genitourinary: Negative for difficulty urinating and dysuria.  Musculoskeletal: Negative for back pain and gait problem.  Skin: Negative for rash.  Neurological: Negative for dizziness, weakness, light-headedness, numbness and headaches.  Psychiatric/Behavioral: Negative for agitation and behavioral problems.  All  other systems reviewed and are negative.   Per HPI unless specifically indicated above   Allergies as of 11/18/2017   No Known Allergies     Medication List        Accurate as of 11/18/17  8:30 AM. Always use your most recent med list.          Calcium Carb-Cholecalciferol 600-800 MG-UNIT Tabs Commonly known as:  CALCIUM 600+D3 Take 1 tablet by mouth daily.   diltiazem 120 MG 24 hr capsule Commonly known as:  CARDIZEM CD TAKE ONE (1) CAPSULE EACH DAY   ELIQUIS 5 MG Tabs tablet Generic drug:  apixaban TAKE ONE TABLET BY MOUTH TWICE DAILY   zolpidem 5 MG tablet Commonly known as:  AMBIEN TAKE 1 TABLET AT BEDTIME AS NEEDED FOR SLEEP          Objective:    BP 137/74   Pulse 73   Temp 98.9 F (37.2 C) (Oral)   Ht _1  (1.651 m)   Wt 165 lb (74.8 kg)   BMI 27.46 kg/m   Wt Readings from Last 3 Encounters:  11/18/17 165 lb (74.8 kg)  09/19/17 163 lb (73.9 kg)  09/18/17 162 lb (73.5 kg)    Physical Exam  Constitutional: She is oriented to person, place, and time. She appears well-developed and well-nourished. No distress.  Eyes: Conjunctivae are normal.  Neck: Neck supple. No thyromegaly present.  Cardiovascular: Normal rate, regular rhythm, normal heart sounds and intact distal pulses.  No murmur  heard. Pulmonary/Chest: Effort normal and breath sounds normal. No respiratory distress. She has no wheezes.  Musculoskeletal: Normal range of motion. She exhibits edema (Trace edema).  Lymphadenopathy:    She has no cervical adenopathy.  Neurological: She is alert and oriented to person, place, and time. Coordination normal.  Skin: Skin is warm and dry. No rash noted. She is not diaphoretic.  Psychiatric: She has a normal mood and affect. Her behavior is normal.  Nursing note and vitals reviewed.       Assessment & Plan:   Problem List Items Addressed This Visit      Cardiovascular and Mediastinum   Atrial fibrillation ([place]) - Primary   Relevant Orders    CBC with Differential/Platelet   CMP14+EGFR     Musculoskeletal and Integument   Osteopenia   Relevant Orders   CMP14+EGFR     Other   Overweight (BMI 25.0-29.9)   Relevant Orders   CMP14+EGFR   Lipid panel       Follow up plan: Return in about 6 months (around 05/21/2018), or if symptoms worsen or fail to improve, for Recheck A. fib and labs.  Counseling provided for all of the vaccine components Orders Placed This Encounter  Procedures  . CBC with Differential/Platelet  . CMP14+EGFR  . Lipid panel    Caryl Pina, MD [facility] 11/18/2017, 8:30 AM

## 2017-11-19 LAB — CMP14+EGFR
ALBUMIN: 4.1 g/dL (ref 3.5–4.7)
ALK PHOS: 75 IU/L (ref 39–117)
ALT: 9 IU/L (ref 0–32)
AST: 13 IU/L (ref 0–40)
Albumin/Globulin Ratio: 1.7 (ref 1.2–2.2)
BILIRUBIN TOTAL: 0.4 mg/dL (ref 0.0–1.2)
BUN / CREAT RATIO: 20 (ref 12–28)
BUN: 17 mg/dL (ref 8–27)
CO2: 28 mmol/L (ref 20–29)
CREATININE: 0.85 mg/dL (ref 0.57–1.00)
Calcium: 9.6 mg/dL (ref 8.7–10.3)
Chloride: 100 mmol/L (ref 96–106)
GFR calc non Af Amer: 64 mL/min/{1.73_m2} (ref >59)
GFR, EST AFRICAN AMERICAN: 74 mL/min/{1.73_m2} (ref >59)
GLOBULIN, TOTAL: 2.4 g/dL (ref 1.5–4.5)
Glucose: 85 mg/dL (ref 65–99)
Potassium: 3.9 mmol/L (ref 3.5–5.2)
Sodium: 139 mmol/L (ref 134–144)
Total Protein: 6.5 g/dL (ref 6.0–8.5)

## 2017-11-19 LAB — CBC WITH DIFFERENTIAL/PLATELET
Basophils Absolute: 0.1 10*3/uL (ref 0.0–0.2)
Basos: 2 %
EOS (ABSOLUTE): 0.1 10*3/uL (ref 0.0–0.4)
Eos: 2 %
Hematocrit: 43.3 % (ref 34.0–46.6)
Hemoglobin: 14.7 g/dL (ref 11.1–15.9)
IMMATURE GRANULOCYTES: 0 %
Immature Grans (Abs): 0 10*3/uL (ref 0.0–0.1)
LYMPHS ABS: 1.3 10*3/uL (ref 0.7–3.1)
Lymphs: 24 %
MCH: 30.4 pg (ref 26.6–33.0)
MCHC: 33.9 g/dL (ref 31.5–35.7)
MCV: 90 fL (ref 79–97)
MONOS ABS: 0.6 10*3/uL (ref 0.1–0.9)
Monocytes: 11 %
NEUTROS PCT: 61 %
Neutrophils Absolute: 3.2 10*3/uL (ref 1.4–7.0)
PLATELETS: 274 10*3/uL (ref 150–450)
RBC: 4.83 x10E6/uL (ref 3.77–5.28)
RDW: 12.8 % (ref 12.3–15.4)
WBC: 5.2 10*3/uL (ref 3.4–10.8)

## 2017-11-19 LAB — LIPID PANEL
CHOLESTEROL TOTAL: 163 mg/dL (ref 100–199)
Chol/HDL Ratio: 3.6 ratio (ref 0.0–4.4)
HDL: 45 mg/dL (ref >39)
LDL CALC: 89 mg/dL (ref 0–99)
Triglycerides: 145 mg/dL (ref 0–149)
VLDL Cholesterol Cal: 29 mg/dL (ref 5–40)

## 2018-01-03 DIAGNOSIS — Z0289 Encounter for other administrative examinations: Secondary | ICD-10-CM

## 2018-01-16 DIAGNOSIS — M1711 Unilateral primary osteoarthritis, right knee: Secondary | ICD-10-CM | POA: Diagnosis not present

## 2018-01-20 ENCOUNTER — Telehealth: Payer: Self-pay

## 2018-01-20 NOTE — Telephone Encounter (Signed)
Yes and FL2 form requires a face-to-face, we can send this to Juliann Pulse and see if she can just re-created off of the old one and bill off of the old visit but if she cannot we need a new visit.

## 2018-01-20 NOTE — Telephone Encounter (Signed)
Rhonda [name] from [facility] called stating that patient is trying to get into their home.  Requesting a new FL2 form due to the one they have on file is too old- Patient was last seen 7/29- Please advise if NTBS. Also states that she was told from patients aid that she has vascular dementia - please advise . Not on problem list

## 2018-01-20 NOTE — Telephone Encounter (Signed)
[facility] aware pt NTBS I am going to call son to set up appt with Dr. Warrick Parisian  LMOVM to make appt for Wednesday w/ Dr. Warrick Parisian

## 2018-01-21 ENCOUNTER — Encounter: Payer: PPO | Admitting: *Deleted

## 2018-02-06 ENCOUNTER — Other Ambulatory Visit: Payer: Self-pay | Admitting: Family Medicine

## 2018-02-06 DIAGNOSIS — R55 Syncope and collapse: Secondary | ICD-10-CM

## 2018-02-06 DIAGNOSIS — I480 Paroxysmal atrial fibrillation: Secondary | ICD-10-CM

## 2018-02-11 ENCOUNTER — Ambulatory Visit (INDEPENDENT_AMBULATORY_CARE_PROVIDER_SITE_OTHER): Payer: PPO | Admitting: *Deleted

## 2018-02-11 ENCOUNTER — Encounter: Payer: Self-pay | Admitting: *Deleted

## 2018-02-11 VITALS — BP 114/80 | HR 66 | Ht 65.0 in | Wt 168.0 lb

## 2018-02-11 DIAGNOSIS — Z23 Encounter for immunization: Secondary | ICD-10-CM | POA: Diagnosis not present

## 2018-02-11 DIAGNOSIS — Z0000 Encounter for general adult medical examination without abnormal findings: Secondary | ICD-10-CM | POA: Diagnosis not present

## 2018-02-11 NOTE — Progress Notes (Signed)
Subjective:   Rhonda [name] is a 84 y.o. female who presents for Medicare Annual (Subsequent) preventive examination.  Rhonda [name] is retired from the [employer].  She enjoys being active in her church, in [place], and reading.  Her husband passed away about 1 year ago, she lives alone now.  Rhonda [name] has one son and one grandson.  She feels her health is unchanged from last year, and reports no hospitalizations, ER visits, or surgeries in the past year.   Review of Systems:  All negative today     Objective:     Vitals: BP 114/80   Pulse 66   Ht 5\' 5"  (1.651 m)   Wt 168 lb (76.2 kg)   BMI 27.96 kg/m   Body mass index is 27.96 kg/m.  Advanced Directives 02/11/2018 10/08/2017 09/24/2016 12/09/2014 11/26/2014  Does Patient Have a Medical Advance Directive? No Yes No No No  Would patient like information on creating a medical advance directive? Yes (MAU/Ambulatory/Procedural Areas - Information given) - No - Patient declined No - patient declined information -    Tobacco Social History   Tobacco Use  Smoking Status Never Smoker  Smokeless Tobacco Never Used       Clinical Intake:     Pain Score: 0-No pain                 Past Medical History:  Diagnosis Date  . Arthritis   . Atrial fibrillation ([place]) 05/2015  . Osteopenia   . Syncope 04/2014   Past Surgical History:  Procedure Laterality Date  . BREAST EXCISIONAL BIOPSY Left   . BREAST EXCISIONAL BIOPSY Right   . COLONOSCOPY  2011  . KNEE ARTHROCENTESIS Bilateral   . POLYPECTOMY  11-01-2009  . SHOULDER SURGERY Left   . TOTAL KNEE ARTHROPLASTY Left   . VAGINAL HYSTERECTOMY     twice - vaginal and then oopherectomy   Family History  Problem Relation Age of Onset  . Heart failure Mother   . Hypertension Father   . Arthritis Father   . Hypertension Sister   . Arthritis Sister   . Cancer Brother   . Bladder Cancer Brother   . Hypertension Brother   . Diabetes Brother     Social History   Socioeconomic History  . Marital status: Married    Spouse name: Not on file  . Number of children: 1  . Years of education: Not on file  . Highest education level: Not on file  Occupational History  . Not on file  Social Needs  . Financial resource strain: Not on file  . Food insecurity:    Worry: Not on file    Inability: Not on file  . Transportation needs:    Medical: Not on file    Non-medical: Not on file  Tobacco Use  . Smoking status: Never Smoker  . Smokeless tobacco: Never Used  Substance and Sexual Activity  . Alcohol use: No    Alcohol/week: 0.0 standard drinks  . Drug use: No  . Sexual activity: Never  Lifestyle  . Physical activity:    Days per week: Not on file    Minutes per session: Not on file  . Stress: Not on file  Relationships  . Social connections:    Talks on phone: Not on file    Gets together: Not on file    Attends religious service: Not on file    Active member of club or organization: Not on  file    Attends meetings of clubs or organizations: Not on file    Relationship status: Not on file  Other Topics Concern  . Not on file  Social History Narrative   Lives with husband.      Outpatient Encounter Medications as of 02/11/2018  Medication Sig  . acetaminophen (TYLENOL) 650 MG CR tablet Take 650 mg by mouth every 8 (eight) hours as needed for pain.  . Calcium Carb-Cholecalciferol (CALCIUM 600+D3) 600-800 MG-UNIT TABS Take 1 tablet by mouth daily.  Marland Kitchen diltiazem (CARDIZEM CD) 120 MG 24 hr capsule TAKE ONE (1) CAPSULE EACH DAY  . ELIQUIS 5 MG TABS tablet TAKE ONE TABLET BY MOUTH TWICE DAILY  . zolpidem (AMBIEN) 5 MG tablet Take 1 tablet (5 mg total) by mouth at bedtime as needed. for sleep   No facility-administered encounter medications on file as of 02/11/2018.     Activities of Daily Living In your present state of health, do you have any difficulty performing the following activities: 02/11/2018  Hearing? N    Vision? N  Difficulty concentrating or making decisions? N  Walking or climbing stairs? N  Dressing or bathing? N  Doing errands, shopping? N  Preparing Food and eating ? N  Using the Toilet? N  In the past six months, have you accidently leaked urine? N  Do you have problems with loss of bowel control? N  Managing your Medications? N  Managing your Finances? N  Housekeeping or managing your Housekeeping? N  Some recent data might be hidden    Patient Care Team: Dettinger, Fransisca Kaufmann, MD as PCP - General (Family Medicine) [name], MD as Consulting Physician (Cardiology) Latanya Maudlin, MD as Consulting Physician (Orthopedic Surgery)    Assessment:   This is a routine wellness examination for Rhonda [name].  Exercise Activities and Dietary recommendations  Rhonda [name] states she eats 3 meals per day and snacks as needed.  Recommended diet of mostly lean proteins, vegetables, fruits, and whole grains.   Current Exercise Habits: Home exercise routine, Type of exercise: walking, Time (Minutes): 10, Frequency (Times/Week): 7, Weekly Exercise (Minutes/Week): 70, Intensity: Mild, Exercise limited by: orthopedic condition(s)  Goals    . Weight (lb) < 160 lb (72.6 kg)     Reduce snacks between meals.        Fall Risk Fall Risk  02/11/2018 11/18/2017 09/19/2017 05/17/2017 11/13/2016  Falls in the past year? No No No No No   Is the patient's home free of loose throw rugs in walkways, pet beds, electrical cords, etc?   yes      Grab bars in the bathroom? yes      Handrails on the stairs?   yes      Adequate lighting?   yes    Depression Screen PHQ 2/9 Scores 02/11/2018 11/18/2017 09/19/2017 05/17/2017  PHQ - 2 Score 0 0 0 0     Cognitive Function MMSE - Mini Mental State Exam 02/11/2018 09/24/2016  Orientation to time 5 5  Orientation to Place 5 5  Registration 3 3  Attention/ Calculation 5 5  Recall 3 3  Language- name 2 objects 2 2  Language- repeat 1 1  Language- follow 3  step command 3 3  Language- read & follow direction 1 1  Write a sentence 1 1  Copy design 1 1  Total score 30 30        Immunization History  Administered Date(s) Administered  . Influenza, High Dose Seasonal PF 03/19/2016,  02/27/2017  . Influenza,inj,Quad PF,6+ Mos 03/23/2015  . Pneumococcal Conjugate-13 09/24/2016  . Pneumococcal Polysaccharide-23 11/18/2017    Qualifies for Shingles Vaccine? Declined today  Screening Tests Health Maintenance  Topic Date Due  . INFLUENZA VACCINE  11/21/2017  . TETANUS/TDAP  05/16/2018 (Originally 11/27/1951)  . DEXA SCAN  05/14/2018  . PNA vac Low Risk Adult  Completed   Flu vaccine given today. Tdap vaccine declined today  Cancer Screenings: Lung: Low Dose CT Chest recommended if Age 72-80 years, 30 pack-year currently smoking OR have quit w/in 15years. Patient does not qualify. Breast:  Up to date on Mammogram? Yes   Up to date of Bone Density/Dexa? Yes Colorectal: up to date  Additional Screenings:  Hepatitis C Screening: Not indicated     Plan:     You set a goal to lose 10 lbs, and to do this your plan is to avoid snacking between meals. Review the information given on Advance Directives, and if you complete these please bring a copy to our office to be filed in your medical record. Consider getting the Shingrix (Shingles) vaccine and Tdap (tetanus, diptheria, and pertusis) in the future.    I have personally reviewed and noted the following in the patient's chart:   . Medical and social history . Use of alcohol, tobacco or illicit drugs  . Current medications and supplements . Functional ability and status . Nutritional status . Physical activity . Advanced directives . List of other physicians . Hospitalizations, surgeries, and ER visits in previous 12 months . Vitals . Screenings to include cognitive, depression, and falls . Referrals and appointments  In addition, I have reviewed and discussed with patient  certain preventive protocols, quality metrics, and best practice recommendations. A written personalized care plan for preventive services as well as general preventive health recommendations were provided to patient.     WYATT, AMY M, RN  02/11/2018   I have reviewed and agree with the above AWV documentation.   Evelina Dun, FNP

## 2018-02-11 NOTE — Patient Instructions (Signed)
You set a goal to lose 10 lbs, and to do this your plan is to avoid snacking between meals.  Please review the information given on Advance Directives, and if you complete these please bring a copy to our office to be filed in your medical record.  Please consider getting the Shingrix (Shingles) vaccine and Tdap (tetanus, diptheria, and pertusis) in the future.   Thank you for coming in for your Annual Wellness Visit today!   Preventive Care 13 Years and Older, Female Preventive care refers to lifestyle choices and visits with your health care provider that can promote health and wellness. What does preventive care include?  A yearly physical exam. This is also called an annual well check.  Dental exams once or twice a year.  Routine eye exams. Ask your health care provider how often you should have your eyes checked.  Personal lifestyle choices, including: ? Daily care of your teeth and gums. ? Regular physical activity. ? Eating a healthy diet. ? Avoiding tobacco and drug use. ? Limiting alcohol use. ? Practicing safe sex. ? Taking low-dose aspirin every day. ? Taking vitamin and mineral supplements as recommended by your health care provider. What happens during an annual well check? The services and screenings done by your health care provider during your annual well check will depend on your age, overall health, lifestyle risk factors, and family history of disease. Counseling Your health care provider may ask you questions about your:  Alcohol use.  Tobacco use.  Drug use.  Emotional well-being.  Home and relationship well-being.  Sexual activity.  Eating habits.  History of falls.  Memory and ability to understand (cognition).  Work and work Statistician.  Reproductive health.  Screening You may have the following tests or measurements:  Height, weight, and BMI.  Blood pressure.  Lipid and cholesterol levels. These may be checked every 5 years, or  more frequently if you are over 28 years old.  Skin check.  Lung cancer screening. You may have this screening every year starting at age 65 if you have a 30-pack-year history of smoking and currently smoke or have quit within the past 15 years.  Fecal occult blood test (FOBT) of the stool. You may have this test every year starting at age 38.  Flexible sigmoidoscopy or colonoscopy. You may have a sigmoidoscopy every 5 years or a colonoscopy every 10 years starting at age 79.  Hepatitis C blood test.  Hepatitis B blood test.  Sexually transmitted disease (STD) testing.  Diabetes screening. This is done by checking your blood sugar (glucose) after you have not eaten for a while (fasting). You may have this done every 1-3 years.  Bone density scan. This is done to screen for osteoporosis. You may have this done starting at age 54.  Mammogram. This may be done every 1-2 years. Talk to your health care provider about how often you should have regular mammograms.  Talk with your health care provider about your test results, treatment options, and if necessary, the need for more tests. Vaccines Your health care provider may recommend certain vaccines, such as:  Influenza vaccine. This is recommended every year.  Tetanus, diphtheria, and acellular pertussis (Tdap, Td) vaccine. You may need a Td booster every 10 years.  Varicella vaccine. You may need this if you have not been vaccinated.  Zoster vaccine. You may need this after age 88.  Measles, mumps, and rubella (MMR) vaccine. You may need at least one dose of MMR  if you were born in 1957 or later. You may also need a second dose.  Pneumococcal 13-valent conjugate (PCV13) vaccine. One dose is recommended after age 33.  Pneumococcal polysaccharide (PPSV23) vaccine. One dose is recommended after age 70.  Meningococcal vaccine. You may need this if you have certain conditions.  Hepatitis A vaccine. You may need this if you have  certain conditions or if you travel or work in places where you may be exposed to hepatitis A.  Hepatitis B vaccine. You may need this if you have certain conditions or if you travel or work in places where you may be exposed to hepatitis B.  Haemophilus influenzae type b (Hib) vaccine. You may need this if you have certain conditions.  Talk to your health care provider about which screenings and vaccines you need and how often you need them. This information is not intended to replace advice given to you by your health care provider. Make sure you discuss any questions you have with your health care provider. Document Released: 05/06/2015 Document Revised: 12/28/2015 Document Reviewed: 02/08/2015 Elsevier Interactive Patient Education  [employer].

## 2018-03-13 DIAGNOSIS — M1711 Unilateral primary osteoarthritis, right knee: Secondary | ICD-10-CM | POA: Diagnosis not present

## 2018-05-11 ENCOUNTER — Encounter (HOSPITAL_COMMUNITY): Payer: Self-pay

## 2018-05-11 ENCOUNTER — Emergency Department (HOSPITAL_COMMUNITY)
Admission: EM | Admit: 2018-05-11 | Discharge: 2018-05-11 | Disposition: A | Discharge status: Home / Self Care | Payer: PPO | Attending: Emergency Medicine | Admitting: Emergency Medicine

## 2018-05-11 ENCOUNTER — Emergency Department (HOSPITAL_COMMUNITY): Payer: PPO

## 2018-05-11 DIAGNOSIS — R0902 Hypoxemia: Secondary | ICD-10-CM | POA: Diagnosis not present

## 2018-05-11 DIAGNOSIS — Z7901 Long term (current) use of anticoagulants: Secondary | ICD-10-CM | POA: Diagnosis not present

## 2018-05-11 DIAGNOSIS — I471 Supraventricular tachycardia: Secondary | ICD-10-CM | POA: Diagnosis not present

## 2018-05-11 DIAGNOSIS — R079 Chest pain, unspecified: Secondary | ICD-10-CM | POA: Diagnosis not present

## 2018-05-11 DIAGNOSIS — R002 Palpitations: Secondary | ICD-10-CM | POA: Diagnosis not present

## 2018-05-11 DIAGNOSIS — Z96652 Presence of left artificial knee joint: Secondary | ICD-10-CM | POA: Insufficient documentation

## 2018-05-11 DIAGNOSIS — I480 Paroxysmal atrial fibrillation: Secondary | ICD-10-CM | POA: Diagnosis not present

## 2018-05-11 DIAGNOSIS — R000 Tachycardia, unspecified: Secondary | ICD-10-CM | POA: Diagnosis not present

## 2018-05-11 DIAGNOSIS — Z79899 Other long term (current) drug therapy: Secondary | ICD-10-CM | POA: Diagnosis not present

## 2018-05-11 DIAGNOSIS — R0689 Other abnormalities of breathing: Secondary | ICD-10-CM | POA: Diagnosis not present

## 2018-05-11 DIAGNOSIS — R69 Illness, unspecified: Secondary | ICD-10-CM | POA: Diagnosis not present

## 2018-05-11 DIAGNOSIS — I4891 Unspecified atrial fibrillation: Secondary | ICD-10-CM | POA: Diagnosis not present

## 2018-05-11 LAB — BASIC METABOLIC PANEL
Anion gap: 10 (ref 5–15)
BUN: 16 mg/dL (ref 8–23)
CO2: 23 mmol/L (ref 22–32)
Calcium: 9 mg/dL (ref 8.9–10.3)
Chloride: 104 mmol/L (ref 98–111)
Creatinine, Ser: 1.05 mg/dL — ABNORMAL HIGH (ref 0.44–1.00)
GFR calc Af Amer: 57 mL/min — ABNORMAL LOW (ref >60)
GFR calc non Af Amer: 49 mL/min — ABNORMAL LOW (ref >60)
GLUCOSE: 119 mg/dL — AB (ref 70–99)
Potassium: 3.8 mmol/L (ref 3.5–5.1)
Sodium: 137 mmol/L (ref 135–145)

## 2018-05-11 LAB — CBC WITH DIFFERENTIAL/PLATELET
Abs Immature Granulocytes: 0.04 10*3/uL (ref 0.00–0.07)
Basophils Absolute: 0.1 10*3/uL (ref 0.0–0.1)
Basophils Relative: 1 %
Eosinophils Absolute: 0.1 10*3/uL (ref 0.0–0.5)
Eosinophils Relative: 0 %
HCT: 48.5 % — ABNORMAL HIGH (ref 36.0–46.0)
Hemoglobin: 14.8 g/dL (ref 12.0–15.0)
Immature Granulocytes: 0 %
Lymphocytes Relative: 11 %
Lymphs Abs: 1.3 10*3/uL (ref 0.7–4.0)
MCH: 29.1 pg (ref 26.0–34.0)
MCHC: 30.5 g/dL (ref 30.0–36.0)
MCV: 95.3 fL (ref 80.0–100.0)
MONOS PCT: 7 %
Monocytes Absolute: 0.7 10*3/uL (ref 0.1–1.0)
NEUTROS ABS: 9 10*3/uL — AB (ref 1.7–7.7)
Neutrophils Relative %: 81 %
Platelets: 231 10*3/uL (ref 150–400)
RBC: 5.09 MIL/uL (ref 3.87–5.11)
RDW: 12.7 % (ref 11.5–15.5)
WBC: 11.2 10*3/uL — ABNORMAL HIGH (ref 4.0–10.5)
nRBC: 0 % (ref 0.0–0.2)

## 2018-05-11 LAB — CBG MONITORING, ED: Glucose-Capillary: 114 mg/dL — ABNORMAL HIGH (ref 70–99)

## 2018-05-11 LAB — I-STAT TROPONIN, ED: Troponin i, poc: 0.08 ng/mL (ref 0.00–0.08)

## 2018-05-11 MED ORDER — DILTIAZEM HCL ER COATED BEADS 120 MG PO CP24
120.0000 mg | ORAL_CAPSULE | Freq: Once | ORAL | Status: DC
  Filled 2018-05-11 (×2): qty 1

## 2018-05-11 NOTE — ED Notes (Signed)
Pt alert and oriented in NAD. Pt verbalized understanding of discharge instructions. 

## 2018-05-11 NOTE — ED Provider Notes (Signed)
[facility] EMERGENCY DEPARTMENT Provider Note   CSN: [id number] Arrival date & time: 05/11/18  0957     History   Chief Complaint Chief Complaint  Patient presents with  . Chest Pain    SVT    HPI Rhonda [name] is a 85 y.o. female past medical history of A. fib, osteopenia, syncope presents for evaluation of fast heart rate.  Patient reports that she got up this morning and had been taking her trash out when all of a sudden she felt like her heart was racing and she "felt like she was going to pass out."  She stated at that time, she felt some slight chest discomfort that she rated at 5/10 that she associates with her heart breathing so fast.  She states she did not lose consciousness.  Patient reports that she did not have any shortness of breath.  Patient was concerned that her A. fib had acted up.  Patient does report history of paroxysmal A. fib and states that she will occasionally go into bouts of A. fib that felt similar but this felt slightly worse.  Reports she is on diltiazem and Eliquis for her A. fib and states that she has been taking the medications.  She denies any missed doses.  Patient was given a dose of adenosine by EMS in route which she states made her feel better.  On ED arrival, patient reports that she feels better and does not feel any chest pain.  Patient reports that she is seen by Dr. Percival Spanish (Cardiology).  Patient states she has not recently been sick denies any fevers, chills, abdominal pain, nausea/vomiting, numbness/weakness of arms or legs.  The history is provided by the patient.    Past Medical History:  Diagnosis Date  . Arthritis   . Atrial fibrillation ([place]) 05/2015  . Osteopenia   . Syncope 04/2014    Patient Active Problem List   Diagnosis Date Noted  . Overweight (BMI 25.0-29.9) 09/19/2017  . Low serum vitamin D 05/24/2016  . Osteopenia 05/23/2016  . Atrial fibrillation ([place]) 07/13/2015  . Syncope 05/05/2014    Past  Surgical History:  Procedure Laterality Date  . BREAST EXCISIONAL BIOPSY Left   . BREAST EXCISIONAL BIOPSY Right   . COLONOSCOPY  2011  . KNEE ARTHROCENTESIS Bilateral   . POLYPECTOMY  11-01-2009  . SHOULDER SURGERY Left   . TOTAL KNEE ARTHROPLASTY Left   . VAGINAL HYSTERECTOMY     twice - vaginal and then oopherectomy     OB History   No obstetric history on file.      Home Medications    Prior to Admission medications   Medication Sig Start Date End Date Taking? Authorizing Provider  acetaminophen (TYLENOL) 650 MG CR tablet Take 650 mg by mouth every 8 (eight) hours as needed for pain.    [provider]  Calcium Carb-Cholecalciferol (CALCIUM 600+D3) 600-800 MG-UNIT TABS Take 1 tablet by mouth daily. 05/23/16   [name], PharmD  diltiazem (CARDIZEM CD) 120 MG 24 hr capsule TAKE ONE (1) CAPSULE EACH DAY 02/07/18   Dettinger, Fransisca Kaufmann, MD  ELIQUIS 5 MG TABS tablet TAKE ONE TABLET BY MOUTH TWICE DAILY 10/10/17   [name], MD  zolpidem (AMBIEN) 5 MG tablet Take 1 tablet (5 mg total) by mouth at bedtime as needed. for sleep 11/18/17   Dettinger, Fransisca Kaufmann, MD    Family History Family History  Problem Relation Age of Onset  . Heart failure Mother   .  Hypertension Father   . Arthritis Father   . Hypertension Sister   . Arthritis Sister   . Cancer Brother   . Bladder Cancer Brother   . Hypertension Brother   . Diabetes Brother     Social History Social History   Tobacco Use  . Smoking status: Never Smoker  . Smokeless tobacco: Never Used  Substance Use Topics  . Alcohol use: No    Alcohol/week: 0.0 standard drinks  . Drug use: No     Allergies   Patient has no known allergies.   Review of Systems Review of Systems  Constitutional: Negative for fever.  Respiratory: Negative for cough and shortness of breath.   Cardiovascular: Positive for chest pain and palpitations.  Gastrointestinal: Negative for abdominal pain, nausea and vomiting.    Genitourinary: Negative for dysuria and hematuria.  Neurological: Positive for light-headedness. Negative for headaches.  All other systems reviewed and are negative.    Physical Exam Updated Vital Signs BP 126/80   Pulse 75   Resp 19   SpO2 97%   Physical Exam Vitals signs and nursing note reviewed.  Constitutional:      Appearance: Normal appearance. She is well-developed.  HENT:     Head: Normocephalic and atraumatic.  Eyes:     General: Lids are normal.     Conjunctiva/sclera: Conjunctivae normal.     Pupils: Pupils are equal, round, and reactive to light.  Neck:     Musculoskeletal: Full passive range of motion without pain.  Cardiovascular:     Rate and Rhythm: Normal rate and regular rhythm.     Pulses: Normal pulses.          Radial pulses are 2+ on the right side and 2+ on the left side.       Dorsalis pedis pulses are 2+ on the right side and 2+ on the left side.     Heart sounds: Normal heart sounds. No murmur. No friction rub. No gallop.   Pulmonary:     Effort: Pulmonary effort is normal.     Breath sounds: Normal breath sounds.     Comments: Lungs clear to auscultation bilaterally.  Symmetric chest rise.  No wheezing, rales, rhonchi. Abdominal:     Palpations: Abdomen is soft. Abdomen is not rigid.     Tenderness: There is no abdominal tenderness. There is no guarding.     Comments: Abdomen is soft, non-distended, non-tender. No rigidity, No guarding. No peritoneal signs.  Musculoskeletal: Normal range of motion.  Skin:    General: Skin is warm and dry.     Capillary Refill: Capillary refill takes less than 2 seconds.  Neurological:     Mental Status: She is alert and oriented to person, place, and time.  Psychiatric:        Speech: Speech normal.      ED Treatments / Results  Labs (all labs ordered are listed, but only abnormal results are displayed) Labs Reviewed  BASIC METABOLIC PANEL - Abnormal; Notable for the following components:       Result Value   Glucose, Bld 119 (*)    Creatinine, Ser 1.05 (*)    GFR calc non Af Amer 49 (*)    GFR calc Af Amer 57 (*)    All other components within normal limits  CBC WITH DIFFERENTIAL/PLATELET - Abnormal; Notable for the following components:   WBC 11.2 (*)    HCT 48.5 (*)    Neutro Abs 9.0 (*)  All other components within normal limits  CBG MONITORING, ED - Abnormal; Notable for the following components:   Glucose-Capillary 114 (*)    All other components within normal limits  I-STAT TROPONIN, ED    EKG EKG Interpretation  Date/Time:  Sunday May 11 2018 10:02:45 EST Ventricular Rate:  86 PR Interval:    QRS Duration: 84 QT Interval:  375 QTC Calculation: 449 R Axis:   -24 Text Interpretation:  Sinus rhythm Borderline left axis deviation No significant change since last tracing Confirmed by Yao, David H ([id number]) on 05/11/2018 10:12:45 AM   Radiology Dg Chest 2 View  Result Date: 05/11/2018 CLINICAL DATA:  Patient not feeling well. EXAM: CHEST - 2 VIEW COMPARISON:  June 17, 2015 FINDINGS: Mild scar atelectasis in the left base. The heart, hila, mediastinum, lungs, and pleura are unremarkable. IMPRESSION: No active cardiopulmonary disease. Electronically Signed   By: David  Williams III M.D   On: 05/11/2018 10:44    Procedures Procedures (including critical care time)  Medications Ordered in ED Medications - No data to display   Initial Impression / Assessment and Plan / ED Course  I have reviewed the triage vital signs and the nursing notes.  Pertinent labs & imaging results that were available during my care of the patient were reviewed by me and considered in my medical decision making (see chart for details).     85  y.o. female possible history of A. fib, syncope, osteopenia presenting for evaluation of fast heart rate that began this morning.  Associate with some chest discomfort, lightheadedness. Patient is afebrile, non-toxic appearing, sitting  comfortably on examination table. Vital signs reviewed and stable.  On EMS arrival, patient had a rhythm strip that showed a heart rate of 229.  Question SVT versus A. fib with RVR.  Patient was given 6 mg of adenosine which slowed her heart rate down.  On her second rhythm strip, appears that she may have some flutter waves.  Her third rhythm strip showed sinus rhythm with heart rate in the 80s.  Patient states she has never had any history of SVT.  She only reports history of A. fib.  She does report she has been compliant with her diltiazem and Eliquis dose.  On ED arrival, she states that her symptoms have resolved and she feels better.  Check chest x-ray, EKG, labs.  Patient took her diltiazem and Eliquis this morning.  Will hold off on p.o. meds here.  Troponin negative.  BMP shows creatinine 1.05.  CBC shows slight leukocytosis of 11.2.  Chest x-ray negative for any acute infectious etiology.  Repeat EKG shows sinus rhythm.  Patient has remained in sinus rhythm during her entire ED course here.  Patient with negative cardiac work-up.  On review of her rhythm strips, question if it was truly A. fib with RVR versus SVT.  Patient has been taking her dose of diltiazem as directed and has not gone into any A. fib here in the ED.  Patient denies any pain at this time. Discussed patient with Dr. Darl Householder.  Patient stable for discharge at this time.  Directed patient to follow-up with Dr. Percival Spanish.  Instructed her to call either Dr. Percival Spanish or [facility] tomorrow morning.  Patient had ample opportunity for questions and discussion. All patient's questions were answered with full understanding. Strict return precautions discussed. Patient expresses understanding and agreement to plan.   Portions of this note were generated with [employer]. Dictation errors may occur despite  best attempts at proofreading.   CHA2DS2/VAS Stroke Risk Points  Current as of 8 minutes ago     4 >= 2 Points: High Risk    1 - 1.99 Points: Medium Risk  0 Points: Low Risk    This is the only CHA2DS2/VAS Stroke Risk Points available for the past  year.:  Last Change: N/A     Details    This score determines the patient's risk of having a stroke if the  patient has atrial fibrillation.       Points Metrics  0 Has Congestive Heart Failure:  No    Current as of 8 minutes ago  0 Has Vascular Disease:  No    Current as of 8 minutes ago  1 Has Hypertension:  Yes     Current as of 8 minutes ago  2 Age:  86    Current as of 8 minutes ago  0 Has Diabetes:  No    Current as of 8 minutes ago  0 Had Stroke:  No  Had TIA:  No  Had thromboembolism:  No    Current as of 8 minutes ago  1 Female:  Yes    Current as of 8 minutes ago         Final Clinical Impressions(s) / ED Diagnoses   Final diagnoses:  Paroxysmal atrial fibrillation [facility])    ED Discharge Orders    None       Desma Mcgregor 05/11/18 1642    Drenda Freeze, MD 05/13/18 [phone]

## 2018-05-11 NOTE — Discharge Instructions (Signed)
Take diltiazem and Eliquis as directed.  As we discussed, you need to follow-up with cardiology.  Call their office tomorrow to arrange for an appointment this week.  Return the emergency department for any chest pain, difficulty breathing, feeling your heart is beating very fast, any other worsening or concerning symptoms.

## 2018-05-11 NOTE — ED Triage Notes (Signed)
Pt arrived via [facility]; pt frm hm with c/o "not feeling well" and some chest discomfort 5/10. Pt reporting wkness and states that pt denies SOB; EMS reports HR of 232 upon arrival and administered 6mg  Adenosine; pt converted bk dwn to HR of 80-90; 134/81; CBG 262; 264ml NS

## 2018-05-13 ENCOUNTER — Encounter: Payer: Self-pay | Admitting: *Deleted

## 2018-05-13 ENCOUNTER — Other Ambulatory Visit: Payer: Self-pay | Admitting: *Deleted

## 2018-05-13 NOTE — Patient Outreach (Addendum)
[facility] Care Management  05/13/2018  LARAYNE BAXLEY [birth year] [id number]   Telephone Screen  Referral Date: 05/13/2018 Referral Source: UM referral -[facility] [facility]  Referral Reason: Member seen in ER on 05/11/2018 for Afib. Spoke with member on 05/12/2018 [phone] she would like to know if we have resources available for co payment assistance for her monthly Rx Eliquis Please follow up  Insurance: HTA   Outreach attempt # 1 successful at her home number  Patient is able to verify HIPAA Reviewed and addressed referral to [facility] with patient  Mrs Nazario confirms the referral request is to get any possible assistance with the cost of her Eliquis She reports being in th donut hole each year related to Eliquis CM discussed that [place] staff is available but there is not a guarantee of co pay assistance She voiced understanding and states she welcomes any possible knowledge or assistance if available   Mrs Pereira had a recent ED visit 05/11/2018 after having a "Spell" while her son was visiting She reports these spells occur "once a month" When asked to describe these spells she reports "something washes over my body and I feel like I am going to faint." I generally sit down and it calms down but last time it did not help"  She reports for her recent episode that her heart was 165 when she checked it and but the time EMS arrived she was at 226  She states once the EMS gave her IV medicine and got her to the ED her heart rate was back to normal She is aware that her heart rate normally is 75-80  ED MD notes indicate she was in SVT and EMS provided Adenosine.that placed her back into sinus rhythm She is not noting that she is doing anything different prior to having these "spells" ED notes reports she was taking out her trash.    Social: Mrs Wussow lives alone since her Husband passed away in 03-14-17. Her son Heron Sabins is supportive and visits. She is  independent with her care and denies issues with transportation to medical appointments    Conditions: atrial fibrillation, HTN, HDL, osteoarthritis of right knee, Left TKR, vertigo    Medications: Eliquis cost was $45/month recently She reports she purchased and has a month supply at her home at the time of this call. She reports she takes 2 tabs a day and has been taking this dose since February 2017  She voiced concern about getting in the donut hole before the end of each year related to Eliquis She is aware that a 3 month supply was $300+ and reports when she was first dx and got the first Eliquis Rx filled the cost was $300+- She can only afford to get a month's worth on her fixed income [facility] [place]  She voices that a friend also receives Eliquis at the same pharmacy but not sure if she has the same insurance and pays less than $45 She confirms she has not received an extra SSI supplemental assistance in the past (was not aware of the resource)  She has not received samples for Eliquis nor Cardizem in the past from MD offices  Appointments: 05/19/2018 ED f/u with Dr Percival Spanish, cardiology staff - Last seen in June 2019 and she report "every time I go to my heart doctor I'm okay"  05/21/2018 Primary care MD, Dettinger 6 month follow up  Advance Directives: Denies need for assist with advance directives    Consent: [facility] RN CM reviewed [name] services with patient. Patient gave verbal consent for services.  Plan: [facility] RN CM will refer her  Mrs Daum to [place] for medication assistance with cost of Eliquis, concerns with donut hole annually for Eliquis and questions about managing cost compared to her friend's cost   Pt encouraged to return a call to [place] CM prn  [facility] RN CM sent a successful outreach letter as discussed with [facility] brochure enclosed for review  Routed note to Epic listed MDs   Jamir Rone L. Lavina Hamman, RN, BSN, [place] Coordinator Office number [phone] Mobile number [phone]  Main [facility] number [phone] Fax number [phone]

## 2018-05-15 ENCOUNTER — Other Ambulatory Visit: Payer: Self-pay

## 2018-05-15 ENCOUNTER — Ambulatory Visit: Payer: Self-pay

## 2018-05-15 DIAGNOSIS — M179 Osteoarthritis of knee, unspecified: Secondary | ICD-10-CM | POA: Diagnosis not present

## 2018-05-15 DIAGNOSIS — M1712 Unilateral primary osteoarthritis, left knee: Secondary | ICD-10-CM | POA: Diagnosis not present

## 2018-05-15 NOTE — Patient Outreach (Addendum)
[name]) Care Management  [place]  05/15/2018  ONI DIETZMAN 11/12/1932 [id number]   Reason for call: medication management and medication assistance  Unsuccessful telephone call attempt # 1 to patient.   HIPAA compliant voicemail left requesting a return call.  Plan:  I will make another outreach attempt to patient within 3-4 business days.  [name], PharmD Clinical Pharmacist [place] [phone]  Addendum: Incoming message received from Rhonda [name] and successful return outreach. HIPAA identifiers verified.   Referral source: [facility] RN Current insurance:[employer]  PMHx includes but not limited to:   Atrial flutter, osteopenia and hypertension  Subjective:  Rhonda [name] reports that she had a recent ED visit for atrial fibrillation.  She reports that it happens about once/month and that it usually goes away if she lies down.  She states she has an appointment with cardiology Monday.  Objective: Lab Results  Component Value Date   CREATININE 1.05 (H) 05/11/2018   CREATININE 0.85 11/18/2017   CREATININE 1.00 05/17/2017    No results found for: HGBA1C  Lipid Panel     Component Value Date/Time   CHOL 163 11/18/2017 0932   TRIG 145 11/18/2017 0932   HDL 45 11/18/2017 0932   CHOLHDL 3.6 11/18/2017 0932   LDLCALC 89 11/18/2017 0932    BP Readings from Last 3 Encounters:  05/11/18 126/80  02/11/18 114/80  11/18/17 137/74    No Known Allergies  Medications Reviewed Today    Reviewed by Dionne Milo, [place] (Pharmacist) on 05/15/18 at 1520  Med List Status: <None>  Medication Order Taking? Sig Documenting Provider Last Dose Status Informant  acetaminophen (TYLENOL) 650 MG CR tablet [id number] Yes Take 650 mg by mouth every 8 (eight) hours as needed for pain. [provider] Taking Active   Calcium Carb-Cholecalciferol (CALCIUM 600+D3) 600-800 MG-UNIT TABS [id number] Yes Take 1  tablet by mouth daily. [name], PharmD Taking Active   diltiazem (CARDIZEM CD) 120 MG 24 hr capsule [id number] Yes TAKE ONE (1) CAPSULE EACH DAY Dettinger, Fransisca Kaufmann, MD Taking Active   ELIQUIS 5 MG TABS tablet [id number] Yes TAKE ONE TABLET BY MOUTH TWICE DAILY [name], MD Taking Active   zolpidem (AMBIEN) 5 MG tablet [id number] Yes Take 1 tablet (5 mg total) by mouth at bedtime as needed. for sleep Dettinger, Fransisca Kaufmann, MD Taking Active           Assessment:  Drugs sorted by system:  Cardiovascular: diltiazem, apixaban  Vitamins/Minerals/Supplements: calcium/vitamin D  Miscellaneous: zolpidem  Medication Review Findings:  . Per the [place] list, zolpidem may have adverse effects similar to benzodiazepines (delirium, falls, fractures, increased ER visits) with minimal improvement in sleep latency.  There is strong recommendations to avoid use if patient > 14 yo. . Discussed the Valsalva maneuver or ice to face next time she has an episode of atrial fibrillation.  She states that she had not heard of trying these things before.   Medication Assistance Findings:  Medication assistance needs identified.   Extra Help:   []  Already receiving Full Extra Help  []  Already receiving Partial Extra Help  []  Eligible based on reported income and assets  [x]  Not Eligible based on reported income and assets  Patient Assistance Programs: 1) Eqiquis made by [employer] o Income requirement met: [x]  Yes []  No []  Unknown o Out-of-pocket prescription expenditure met:    []  Yes [x]  No  []  Unknown  []  Not applicable - Patient  has not met application requirements to apply for this patient assistance program at this time. Will need to spend 3% of household income ~ $525 to be eligible.         Plan: Will close [facility] pharmacy case as no further medication needs identified at this time.  Am happy to assist in the future as needed.  [name] has my phone number and I requested that  she call me when she has spent ~$450 on her prescriptions and I will send her an Eliquis patient assistance application.    [name], PharmD Clinical Pharmacist [place] [phone]

## 2018-05-18 NOTE — Progress Notes (Signed)
Cardiology Office Note   Date:  05/19/2018   ID:  Rhonda [name], DOB 1932-07-12, MRN [id number]  PCP:  Dettinger, Fransisca Kaufmann, MD  Cardiologist:  [facility] Chief Complaint  Patient presents with  . Follow-up    ER F/U  . Atrial Fibrillation     History of Present Illness: Rhonda [name] is a 85 y.o. female who presents for ongoing assessment and management of atrial fibrillation, s/p DCCV, with some paroxysms, usually at night, asymptomatic, with prior history of syncope felt to be vagal. She has a CHADS VASC score of 3 on Apixaban.   She was seen in the ER on 05/17/2018, fter having rapid HR while at home, Called EMS who found her to be in afib rate of 220 bpm. She was treated with adenosine 6 mg IV. By the time she was seen in ER, HR was back to NSR. She comes today "feeling yuck."  She can tell that her HR was elevated. She was continued on diltiazem 120 mg. She has been on higher dose dose of diltiazem 180 mg but she became hypotensive.  EKG today revea;ed atrial afib RVR, rate of 160 bpm.   She denies chest pain or dizziness. She has not missed a dose of the Apixaban, or diltiazem.   Past Medical History:  Diagnosis Date  . Arthritis   . Atrial fibrillation ([place]) 05/2015  . Osteopenia   . Syncope 04/2014    Past Surgical History:  Procedure Laterality Date  . BREAST EXCISIONAL BIOPSY Left   . BREAST EXCISIONAL BIOPSY Right   . COLONOSCOPY  2011  . KNEE ARTHROCENTESIS Bilateral   . POLYPECTOMY  11-01-2009  . SHOULDER SURGERY Left   . TOTAL KNEE ARTHROPLASTY Left   . VAGINAL HYSTERECTOMY     twice - vaginal and then oopherectomy     Current Outpatient Medications  Medication Sig Dispense Refill  . acetaminophen (TYLENOL) 650 MG CR tablet Take 650 mg by mouth every 8 (eight) hours as needed for pain.    . Calcium Carb-Cholecalciferol (CALCIUM 600+D3) 600-800 MG-UNIT TABS Take 1 tablet by mouth daily. 60 tablet   . diltiazem (CARDIZEM CD) 120 MG 24 hr capsule TAKE ONE (1)  CAPSULE EACH DAY 90 capsule 0  . ELIQUIS 5 MG TABS tablet TAKE ONE TABLET BY MOUTH TWICE DAILY 180 tablet 1  . zolpidem (AMBIEN) 5 MG tablet Take 1 tablet (5 mg total) by mouth at bedtime as needed. for sleep 30 tablet 5   No current facility-administered medications for this visit.     Allergies:   Patient has no known allergies.    Social History:  The patient  reports that she has never smoked. She has never used smokeless tobacco. She reports that she does not drink alcohol or use drugs.   Family History:  The patient's family history includes Arthritis in her father and sister; Bladder Cancer in her brother; Cancer in her brother; Diabetes in her brother; Heart failure in her mother; Hypertension in her brother, father, and sister.    ROS: All other systems are reviewed and negative. Unless otherwise mentioned in H&P    PHYSICAL EXAM: VS:  BP (!) 149/89 (BP Location: Left Arm, Patient Position: Sitting, Cuff Size: Normal)   Pulse (!) 160   Ht 5\' 5"  (1.651 m)   Wt 175 lb 6.4 oz (79.6 kg)   SpO2 96%   BMI 29.19 kg/m  , BMI Body mass index is 29.19 kg/m. GEN: Well nourished, well developed,  in no acute distress HEENT: normal Neck: no JVD, carotid bruits, or masses Cardiac: IRRR, tachycardic; no murmurs, rubs, or gallops,no edema  Respiratory:  Clear to auscultation bilaterally, normal work of breathing GI: soft, nontender, nondistended, + BS MS: no deformity or atrophy Skin: warm and dry, no rash Neuro:  Strength and sensation are intact Psych: euthymic mood, full affect   EKG:  Atrial fib with RVR, rate of 160 bpm, with PVCs.   Recent Labs: 11/18/2017: ALT 9 05/11/2018: BUN 16; Creatinine, Ser 1.05; Hemoglobin 14.8; Platelets 231; Potassium 3.8; Sodium 137    Lipid Panel    Component Value Date/Time   CHOL 163 11/18/2017 0932   TRIG 145 11/18/2017 0932   HDL 45 11/18/2017 0932   CHOLHDL 3.6 11/18/2017 0932   LDLCALC 89 11/18/2017 0932      Wt Readings from  Last 3 Encounters:  05/19/18 175 lb 6.4 oz (79.6 kg)  02/11/18 168 lb (76.2 kg)  11/18/17 165 lb (74.8 kg)      Other studies Reviewed: None-not had echo or stress test in the past.   ASSESSMENT AND PLAN:  1.  Atrial fib with RVR: She comes today feeling unwell. She can tell that her HR is irregular and fast. She states this has been occurring throughout the day. She has taken apixaban each day and not missed any doses, she has also taken diltiazem 120 mg today. She has not had an echo or stress test in the past.   I talked this over with Dr. who felt she would be better served if taken to the ER. She refused EMS, son is with her, who will drive her. I have called and spoken to [facility] ED to inform them that she is coming. I have made a copy of the EKG for her to take with her. We have notified Birdie Sons, of [place] so that the cardiologist can be notified of her arrival.    Current medicines are reviewed at length with the patient today.    Labs/ tests ordered today include: None   Phill Myron. [place] Pugh, ANP, AACC   05/19/2018 4:19 PM    [place] [address] Office [phone] Fax [phone]

## 2018-05-19 ENCOUNTER — Emergency Department (HOSPITAL_COMMUNITY): Payer: PPO

## 2018-05-19 ENCOUNTER — Encounter: Payer: Self-pay | Admitting: Adult Health

## 2018-05-19 ENCOUNTER — Other Ambulatory Visit: Payer: Self-pay | Admitting: Adult Health

## 2018-05-19 ENCOUNTER — Ambulatory Visit: Payer: PPO | Admitting: Adult Health

## 2018-05-19 ENCOUNTER — Encounter (HOSPITAL_COMMUNITY): Payer: Self-pay

## 2018-05-19 ENCOUNTER — Inpatient Hospital Stay (HOSPITAL_COMMUNITY)
Admission: EM | Admit: 2018-05-19 | Discharge: 2018-05-21 | DRG: 310 | Disposition: A | Discharge status: Home / Self Care | Payer: PPO | Attending: Internal Medicine | Admitting: Internal Medicine

## 2018-05-19 VITALS — BP 149/89 | HR 160 | Ht 65.0 in | Wt 175.4 lb

## 2018-05-19 DIAGNOSIS — M8580 Other specified disorders of bone density and structure, unspecified site: Secondary | ICD-10-CM | POA: Diagnosis present

## 2018-05-19 DIAGNOSIS — J986 Disorders of diaphragm: Secondary | ICD-10-CM | POA: Diagnosis not present

## 2018-05-19 DIAGNOSIS — Z79899 Other long term (current) drug therapy: Secondary | ICD-10-CM | POA: Diagnosis not present

## 2018-05-19 DIAGNOSIS — Z96652 Presence of left artificial knee joint: Secondary | ICD-10-CM | POA: Diagnosis not present

## 2018-05-19 DIAGNOSIS — Z833 Family history of diabetes mellitus: Secondary | ICD-10-CM

## 2018-05-19 DIAGNOSIS — I361 Nonrheumatic tricuspid (valve) insufficiency: Secondary | ICD-10-CM | POA: Diagnosis not present

## 2018-05-19 DIAGNOSIS — R000 Tachycardia, unspecified: Secondary | ICD-10-CM | POA: Diagnosis not present

## 2018-05-19 DIAGNOSIS — I4891 Unspecified atrial fibrillation: Secondary | ICD-10-CM | POA: Insufficient documentation

## 2018-05-19 DIAGNOSIS — Z8249 Family history of ischemic heart disease and other diseases of the circulatory system: Secondary | ICD-10-CM | POA: Diagnosis not present

## 2018-05-19 DIAGNOSIS — Z90710 Acquired absence of both cervix and uterus: Secondary | ICD-10-CM

## 2018-05-19 DIAGNOSIS — I480 Paroxysmal atrial fibrillation: Secondary | ICD-10-CM

## 2018-05-19 DIAGNOSIS — R002 Palpitations: Secondary | ICD-10-CM | POA: Diagnosis not present

## 2018-05-19 DIAGNOSIS — Z8261 Family history of arthritis: Secondary | ICD-10-CM | POA: Diagnosis not present

## 2018-05-19 DIAGNOSIS — R531 Weakness: Secondary | ICD-10-CM | POA: Diagnosis not present

## 2018-05-19 DIAGNOSIS — Z8052 Family history of malignant neoplasm of bladder: Secondary | ICD-10-CM

## 2018-05-19 LAB — BASIC METABOLIC PANEL
ANION GAP: 13 (ref 5–15)
BUN: 23 mg/dL (ref 8–23)
CALCIUM: 8.8 mg/dL — AB (ref 8.9–10.3)
CO2: 22 mmol/L (ref 22–32)
Chloride: 101 mmol/L (ref 98–111)
Creatinine, Ser: 1.07 mg/dL — ABNORMAL HIGH (ref 0.44–1.00)
GFR calc Af Amer: 56 mL/min — ABNORMAL LOW (ref >60)
GFR, EST NON AFRICAN AMERICAN: 48 mL/min — AB (ref >60)
Glucose, Bld: 110 mg/dL — ABNORMAL HIGH (ref 70–99)
Potassium: 3.6 mmol/L (ref 3.5–5.1)
Sodium: 136 mmol/L (ref 135–145)

## 2018-05-19 LAB — HEPATIC FUNCTION PANEL
ALT: 59 U/L — ABNORMAL HIGH (ref 0–44)
AST: 30 U/L (ref 15–41)
Albumin: 3.7 g/dL (ref 3.5–5.0)
Alkaline Phosphatase: 52 U/L (ref 38–126)
Bilirubin, Direct: 0.1 mg/dL (ref 0.0–0.2)
Indirect Bilirubin: 0.7 mg/dL (ref 0.3–0.9)
Total Bilirubin: 0.8 mg/dL (ref 0.3–1.2)
Total Protein: 6.4 g/dL — ABNORMAL LOW (ref 6.5–8.1)

## 2018-05-19 LAB — CBC
HCT: 47.5 % — ABNORMAL HIGH (ref 36.0–46.0)
Hemoglobin: 14.7 g/dL (ref 12.0–15.0)
MCH: 28.9 pg (ref 26.0–34.0)
MCHC: 30.9 g/dL (ref 30.0–36.0)
MCV: 93.5 fL (ref 80.0–100.0)
Platelets: 285 10*3/uL (ref 150–400)
RBC: 5.08 MIL/uL (ref 3.87–5.11)
RDW: 13 % (ref 11.5–15.5)
WBC: 9.4 10*3/uL (ref 4.0–10.5)
nRBC: 0 % (ref 0.0–0.2)

## 2018-05-19 LAB — I-STAT TROPONIN, ED: Troponin i, poc: 0.01 ng/mL (ref 0.00–0.08)

## 2018-05-19 LAB — TROPONIN I: Troponin I: <0.03 ng/mL (ref <0.03)

## 2018-05-19 MED ORDER — ACETAMINOPHEN 325 MG PO TABS
650.0000 mg | ORAL_TABLET | ORAL | Status: DC | PRN
  Administered 2018-05-20: 650 mg via ORAL
  Filled 2018-05-19: qty 2

## 2018-05-19 MED ORDER — SODIUM CHLORIDE 0.9 % IV SOLN: 250.0000 mL | INTRAVENOUS | Status: DC | PRN

## 2018-05-19 MED ORDER — SODIUM CHLORIDE 0.9% FLUSH: 3.0000 mL | Freq: Two times a day (BID) | INTRAVENOUS | Status: DC

## 2018-05-19 MED ORDER — ASPIRIN EC 81 MG PO TBEC: 81.0000 mg | DELAYED_RELEASE_TABLET | Freq: Every day | ORAL | Status: DC

## 2018-05-19 MED ORDER — ONDANSETRON HCL 4 MG/2ML IJ SOLN: 4.0000 mg | Freq: Four times a day (QID) | INTRAMUSCULAR | Status: DC | PRN

## 2018-05-19 MED ORDER — SODIUM CHLORIDE 0.9 % IV BOLUS
500.0000 mL | Freq: Once | INTRAVENOUS | Status: AC
  Administered 2018-05-19: 500 mL via INTRAVENOUS

## 2018-05-19 MED ORDER — ACETAMINOPHEN 325 MG PO TABS: 650.0000 mg | ORAL_TABLET | ORAL | Status: DC | PRN

## 2018-05-19 MED ORDER — SODIUM CHLORIDE 0.9 % IV SOLN
INTRAVENOUS | Status: DC
  Administered 2018-05-19: 18:00:00 via INTRAVENOUS
  Administered 2018-05-20 – 2018-05-21 (×2): 75 mL/h via INTRAVENOUS

## 2018-05-19 MED ORDER — SODIUM CHLORIDE 0.9% FLUSH: 3.0000 mL | INTRAVENOUS | Status: DC | PRN

## 2018-05-19 MED ORDER — NITROGLYCERIN 0.4 MG SL SUBL: 0.4000 mg | SUBLINGUAL_TABLET | SUBLINGUAL | Status: DC | PRN

## 2018-05-19 MED ORDER — DILTIAZEM LOAD VIA INFUSION
10.0000 mg | Freq: Once | INTRAVENOUS | Status: AC
  Administered 2018-05-19: 10 mg via INTRAVENOUS
  Filled 2018-05-19: qty 10

## 2018-05-19 MED ORDER — FLECAINIDE ACETATE 50 MG PO TABS
50.0000 mg | ORAL_TABLET | Freq: Two times a day (BID) | ORAL | Status: DC
  Administered 2018-05-20 – 2018-05-21 (×4): 50 mg via ORAL
  Filled 2018-05-19 (×5): qty 1

## 2018-05-19 MED ORDER — DILTIAZEM HCL-DEXTROSE 100-5 MG/100ML-% IV SOLN (PREMIX)
5.0000 mg/h | INTRAVENOUS | Status: DC
  Administered 2018-05-19: 5 mg/h via INTRAVENOUS
  Administered 2018-05-20: 15 mg/h via INTRAVENOUS
  Administered 2018-05-20 – 2018-05-21 (×2): 10 mg/h via INTRAVENOUS
  Filled 2018-05-19 (×4): qty 100

## 2018-05-19 MED ORDER — SODIUM CHLORIDE 0.9% FLUSH: 3.0000 mL | Freq: Once | INTRAVENOUS | Status: DC

## 2018-05-19 NOTE — Patient Instructions (Signed)
ADMIT TO [place] GO DIRECTLY TO THE ER FOR ADMISSION

## 2018-05-19 NOTE — ED Provider Notes (Signed)
[facility] EMERGENCY DEPARTMENT Provider Note   CSN: [id number] Arrival date & time: 05/19/18  1632     History   Chief Complaint Chief Complaint  Patient presents with  . Atrial Fibrillation    HPI Rhonda [name] is a 85 y.o. female.  Patient sent over from cardiology office for rapid atrial fibrillation heart rate in the 160s.  Patient was not aware heart was this fast.  But she did state that for the past week she has been kind of feeling fatigued and a little bit of generalized weakness.  Patient was seen in the emergency department Sunday a week ago was brought in by EMS for heart rate in the 220 range.  At that rate patient felt as if she was going to pass out.  EMS gave her adenosine she converted prior to arrival.  Was observed just in the emergency department and then eventually discharged home.  But sounds as if she may have gone back in to the fast rhythm and was not aware of it.  Or certainly it was intermittent.  Patient just went to cardiology for routine follow-up was not symptomatic other than the things mentioned above.  Denies any shortness of breath or chest pain.     Past Medical History:  Diagnosis Date  . Arthritis   . Atrial fibrillation ([place]) 05/2015  . Osteopenia   . Syncope 04/2014    Patient Active Problem List   Diagnosis Date Noted  . Atrial fibrillation with RVR ([place]) 05/19/2018  . Overweight (BMI 25.0-29.9) 09/19/2017  . Low serum vitamin D 05/24/2016  . Osteopenia 05/23/2016  . Atrial fibrillation ([place]) 07/13/2015  . Syncope 05/05/2014    Past Surgical History:  Procedure Laterality Date  . BREAST EXCISIONAL BIOPSY Left   . BREAST EXCISIONAL BIOPSY Right   . COLONOSCOPY  2011  . KNEE ARTHROCENTESIS Bilateral   . POLYPECTOMY  11-01-2009  . SHOULDER SURGERY Left   . TOTAL KNEE ARTHROPLASTY Left   . VAGINAL HYSTERECTOMY     twice - vaginal and then oopherectomy     OB History   No obstetric history on file.       Home Medications    Prior to Admission medications   Medication Sig Start Date End Date Taking? Authorizing Provider  Calcium Carb-Cholecalciferol (CALCIUM 600+D3) 600-800 MG-UNIT TABS Take 1 tablet by mouth daily. 05/23/16   [name], PharmD  diltiazem (CARDIZEM CD) 120 MG 24 hr capsule TAKE ONE (1) CAPSULE EACH DAY 02/07/18   Dettinger, Fransisca Kaufmann, MD    Family History Family History  Problem Relation Age of Onset  . Heart failure Mother   . Hypertension Father   . Arthritis Father   . Hypertension Sister   . Arthritis Sister   . Cancer Brother   . Bladder Cancer Brother   . Hypertension Brother   . Diabetes Brother     Social History Social History   Tobacco Use  . Smoking status: Never Smoker  . Smokeless tobacco: Never Used  Substance Use Topics  . Alcohol use: No    Alcohol/week: 0.0 standard drinks  . Drug use: No     Allergies   Patient has no known allergies.   Review of Systems Review of Systems  Constitutional: Positive for fatigue. Negative for chills and fever.  HENT: Negative for rhinorrhea and sore throat.   Eyes: Negative for visual disturbance.  Respiratory: Negative for cough and shortness of breath.   Cardiovascular: Positive for  palpitations. Negative for chest pain and leg swelling.  Gastrointestinal: Negative for abdominal pain, diarrhea, nausea and vomiting.  Genitourinary: Negative for dysuria.  Musculoskeletal: Negative for back pain and neck pain.  Skin: Negative for rash.  Neurological: Positive for weakness. Negative for dizziness, light-headedness and headaches.  Hematological: Does not bruise/bleed easily.  Psychiatric/Behavioral: Negative for confusion.     Physical Exam Updated Vital Signs BP (!) 120/94   Pulse (!) 162   Temp 98.2 F (36.8 C)   Resp 17   SpO2 100%   Physical Exam Vitals signs and nursing note reviewed.  Constitutional:      General: She is not in acute distress.    Appearance: Normal  appearance. She is well-developed.  HENT:     Head: Normocephalic and atraumatic.     Mouth/Throat:     Mouth: Mucous membranes are moist.  Eyes:     Conjunctiva/sclera: Conjunctivae normal.  Neck:     Musculoskeletal: Neck supple.  Cardiovascular:     Rate and Rhythm: Tachycardia present. Rhythm irregular.     Heart sounds: No murmur.  Pulmonary:     Effort: Pulmonary effort is normal. No respiratory distress.     Breath sounds: Normal breath sounds.  Abdominal:     Palpations: Abdomen is soft.     Tenderness: There is no abdominal tenderness.  Musculoskeletal: Normal range of motion.  Skin:    General: Skin is warm and dry.  Neurological:     General: No focal deficit present.     Mental Status: She is alert and oriented to person, place, and time.      ED Treatments / Results  Labs (all labs ordered are listed, but only abnormal results are displayed) Labs Reviewed  CBC - Abnormal; Notable for the following components:      Result Value   HCT 47.5 (*)    All other components within normal limits  BASIC METABOLIC PANEL  I-STAT TROPONIN, ED    EKG EKG Interpretation  Date/Time:  Monday May 19 2018 16:40:46 EST Ventricular Rate:  168 PR Interval:    QRS Duration: 78 QT Interval:  262 QTC Calculation: 437 R Axis:   -22 Text Interpretation:  Atrial fibrillation with rapid ventricular response Nonspecific ST abnormality Abnormal ECG afib new Confirmed by Merrily Pew [phone]) on 05/19/2018 4:45:53 PM   Radiology Dg Chest Port 1 View  Result Date: 05/19/2018 CLINICAL DATA:  85 year old female with a history of palpitations EXAM: PORTABLE CHEST 1 VIEW COMPARISON:  05/11/2018 FINDINGS: Cardiomediastinal silhouette unchanged in size and contour. Persisting elevation of the right hemidiaphragm. Double density of the lower mediastinum is unchanged. No pneumothorax. No pleural effusion. No new confluent airspace disease. No displaced fracture IMPRESSION: Chronic lung  changes with no evidence of acute cardiopulmonary disease. Redemonstration of elevation of the right hemidiaphragm. Hiatal hernia Electronically Signed   By: Corrie Mckusick D.O.   On: 05/19/2018 17:45    Procedures Procedures (including critical care time)  CRITICAL CARE Performed by: Fredia Sorrow Total critical care time: 30 minutes Critical care time was exclusive of separately billable procedures and treating other patients. Critical care was necessary to treat or prevent imminent or life-threatening deterioration. Critical care was time spent personally by me on the following activities: development of treatment plan with patient and/or surrogate as well as nursing, discussions with consultants, evaluation of patient's response to treatment, examination of patient, obtaining history from patient or surrogate, ordering and performing treatments and interventions, ordering and review of  laboratory studies, ordering and review of radiographic studies, pulse oximetry and re-evaluation of patient's condition.  Medications Ordered in ED Medications  sodium chloride flush (NS) 0.9 % injection 3 mL (has no administration in time range)  diltiazem (CARDIZEM) 1 mg/mL load via infusion 10 mg (has no administration in time range)    And  diltiazem (CARDIZEM) 100 mg in dextrose 5% 169mL (1 mg/mL) infusion (has no administration in time range)  0.9 %  sodium chloride infusion (has no administration in time range)  sodium chloride 0.9 % bolus 500 mL (500 mLs Intravenous New Bag/Given 05/19/18 1730)     Initial Impression / Assessment and Plan / ED Course  I have reviewed the triage vital signs and the nursing notes.  Pertinent labs & imaging results that were available during my care of the patient were reviewed by me and considered in my medical decision making (see chart for details).    Patient sent over from a cardiology office for rapid atrial fibrillation.  Patient is already on diltiazem  extended release 120 mg.  Patient is also on Eliquis.  Patient seen Sunday a week ago heart rate got up in the 220s.  EMS gave her adenosine she ended up converting.  Patient was observed here briefly in the ED and then discharged home.  Patient has not felt well since shortly after being home.  Patient went for just routine follow-up today they discovered her heart rate was in the 160s with rapid atrial fibrillation.  Patient was to be admitted but there was no beds the patient sent over here for further evaluation.  Patient seen by me heart rate in the 160s blood pressure fine nontoxic no acute distress.  Considered cardioversion but based on her story and her not being able to tell when her heart rates are fast I think is better to do medication to help control the atrial fib and then get her admitted to make sure that it stays under control.  Discussed with Dr. Debara Pickett from cardiology he agrees.   Patient received Cardizem bolus and then Cardizem drip.  Chest x-ray without any acute findings.  Initial troponin is normal.  Final Clinical Impressions(s) / ED Diagnoses   Final diagnoses:  Atrial fibrillation with RVR [facility])    ED Discharge Orders    None       Fredia Sorrow, MD 05/19/18 1758

## 2018-05-19 NOTE — ED Triage Notes (Signed)
Pt sent by cardiology after a follow up visit from er visit 2 weeks ago for a-fib RVR rate in the 160s. Pt has had generalized weakness since then and palpitations. BP normal. Axox4. Denies shob or chest pain.

## 2018-05-19 NOTE — H&P (Signed)
CARDIOLOGY HISTORY AND PHYSICAL   Patient ID: Rhonda [name] MRN: [id number]  DOB/AGE: 10-31-1932 85 y.o. Admit date: (Not on file)  Primary Care Physician: Dettinger, Fransisca Kaufmann, MD Primary Cardiologist: Hochrein   Clinical Summary Rhonda [name] is a 85 y.o.female who was seen in the office today for follow up after being seen in the ER for Afib with RVR on Sunday, 05/17/2018. She was brought in by EMS after recording HR of A fib RVR rate of 220 bpm. She was given adenosine in route to ER and had converted to NSR in ER. She was continued on Diltiazem 120 mg daily along with apixaban 5 mg BID.   She was seen in the office today with complaints of 'just feeling yuck" with racing HR . EKG in the office revealed Afib with RVR 160 bpm. She had taken her Diltiazem and Eliquis today.    No Known Allergies  Home Medications Diltiazem 120 mg daily Apixaban 5 mg BID Zolpidem 5 mg at HS Calcium Carbonate 600+ D3 daily  Scheduled Medications  Pending  Infusions  Pending   PRN Medications None  Past Medical History:  Diagnosis Date  . Arthritis   . Atrial fibrillation ([place]) 05/2015  . Osteopenia   . Syncope 04/2014    Past Surgical History:  Procedure Laterality Date  . BREAST EXCISIONAL BIOPSY Left   . BREAST EXCISIONAL BIOPSY Right   . COLONOSCOPY  2011  . KNEE ARTHROCENTESIS Bilateral   . POLYPECTOMY  11-01-2009  . SHOULDER SURGERY Left   . TOTAL KNEE ARTHROPLASTY Left   . VAGINAL HYSTERECTOMY     twice - vaginal and then oopherectomy    Family History  Problem Relation Age of Onset  . Heart failure Mother   . Hypertension Father   . Arthritis Father   . Hypertension Sister   . Arthritis Sister   . Cancer Brother   . Bladder Cancer Brother   . Hypertension Brother   . Diabetes Brother     Social History Rhonda [name] reports that she has never smoked. She has never used smokeless tobacco. Rhonda [name] reports no history of alcohol use.  Review of  Systems Otherwise reviewed and negative except as outlined.  Physical Examination Pulse Rate:  [160] 160 (01/27 1525) BP: (149)/(89) 149/89 (01/27 1525) SpO2:  [96 %] 96 % (01/27 1525) Weight:  [79.6 kg] 79.6 kg (01/27 1525) No intake or output data in the 24 hours ending 05/19/18 1637  Gen: No acute distress. HEENT: Conjunctiva and lids normal, oropharynx clear with moist mucosa. Neck: Supple, no elevated JVP or carotid bruits, no thyromegaly. Lungs: Clear to auscultation, nonlabored breathing at rest. Cardiac: Regular rate and rhythm, no S3 or significant systolic murmur, no pericardial rub. Abdomen: Soft, nontender, no hepatomegaly, bowel sounds present, no guarding or rebound. Extremities: No pitting edema, distal pulses 2+. Skin: Warm and dry. Musculoskeletal: No kyphosis. Neuropsychiatric: Alert and oriented x3, affect grossly appropriate.   Lab Results Pending   Prior Cardiac Testing/Procedures:  None  ECG: Atrial fib with RVR, Rate of 160 bpm.   Impression and Recommendations 1.Atrial fib with RVR: She is symptomatic but not severe chest pain or dyspnea. EKG has been reviewed by Dr. Oval Linsey, DOD at [facility] office. It is her recommendation that the patient be sent to ED for admission and further cardiac work up. She is given a copy of the EKG from the office.   She will need echocardiogram for structural heart disease. Amiodarone infusion may  be necessary. She may need further ischemic work up if recommended during evaluation.    Signed: Phill Myron. [place] Pugh, ANP, AACC  05/19/2018, 4:37 PM Co-Sign MD

## 2018-05-20 ENCOUNTER — Other Ambulatory Visit: Payer: Self-pay

## 2018-05-20 ENCOUNTER — Encounter (HOSPITAL_COMMUNITY): Payer: Self-pay | Admitting: General Practice

## 2018-05-20 ENCOUNTER — Other Ambulatory Visit: Payer: Self-pay | Admitting: Physician Assistant

## 2018-05-20 ENCOUNTER — Inpatient Hospital Stay (HOSPITAL_COMMUNITY): Payer: PPO

## 2018-05-20 DIAGNOSIS — I361 Nonrheumatic tricuspid (valve) insufficiency: Secondary | ICD-10-CM

## 2018-05-20 LAB — ECHOCARDIOGRAM COMPLETE
HEIGHTINCHES: 65 in
Weight: 2806.4 oz

## 2018-05-20 LAB — BASIC METABOLIC PANEL
Anion gap: 20 — ABNORMAL HIGH (ref 5–15)
BUN: 24 mg/dL — ABNORMAL HIGH (ref 8–23)
CO2: 14 mmol/L — ABNORMAL LOW (ref 22–32)
CREATININE: 1.03 mg/dL — AB (ref 0.44–1.00)
Calcium: 9.1 mg/dL (ref 8.9–10.3)
Chloride: 103 mmol/L (ref 98–111)
GFR calc Af Amer: 58 mL/min — ABNORMAL LOW (ref >60)
GFR calc non Af Amer: 50 mL/min — ABNORMAL LOW (ref >60)
Glucose, Bld: 78 mg/dL (ref 70–99)
Potassium: 4.3 mmol/L (ref 3.5–5.1)
Sodium: 137 mmol/L (ref 135–145)

## 2018-05-20 MED ORDER — ZOLPIDEM TARTRATE 5 MG PO TABS
2.5000 mg | ORAL_TABLET | Freq: Once | ORAL | Status: AC
  Administered 2018-05-20: 2.5 mg via ORAL
  Filled 2018-05-20: qty 1

## 2018-05-20 MED ORDER — APIXABAN 5 MG PO TABS
5.0000 mg | ORAL_TABLET | Freq: Two times a day (BID) | ORAL | Status: DC
  Administered 2018-05-20 – 2018-05-21 (×3): 5 mg via ORAL
  Filled 2018-05-20 (×4): qty 1

## 2018-05-20 MED ORDER — ZOLPIDEM TARTRATE 5 MG PO TABS
2.5000 mg | ORAL_TABLET | Freq: Every evening | ORAL | Status: DC | PRN
  Administered 2018-05-20: 2.5 mg via ORAL
  Filled 2018-05-20: qty 1

## 2018-05-20 NOTE — Progress Notes (Signed)
HR parameters increased to 150 (high limit) on bedside telemetry monitor, Rosaria Ferries, PA aware.

## 2018-05-20 NOTE — ED Notes (Signed)
Attempte to increase cardizem and bp decreased. cardizem returned to 10cc/h

## 2018-05-20 NOTE — CV Procedure (Signed)
Echocardiogram not completed, patient undergoing other testing at this time.   Darlina Sicilian RDCS

## 2018-05-20 NOTE — ED Notes (Signed)
Pt oob to bathroom via wc. 

## 2018-05-20 NOTE — ED Notes (Signed)
Pt denies pain or c/o. Taking po fluids and tolerating well.

## 2018-05-20 NOTE — ED Notes (Signed)
Callie from cardiology contacted to ask about elliquis medication which pt state she has missed 2 doses. Callie informed of same.

## 2018-05-20 NOTE — Progress Notes (Signed)
   RN informed me that patient's home Eliquis was not started on admission. She missed her dose last night and this morning. Patient is scheduled for DCCV tomorrow. Restarted patient's Eliquis but patient will need TEE prior to DCCV. Called and spoke with Endo to see if we could add TEE tomorrow. Scheduled is currently full but they will keep her on schedule in case a spot opens up.  Darreld Mclean, PA-C 05/20/2018 1:34 PM

## 2018-05-20 NOTE — ED Notes (Signed)
Pharmacy again called for flecanide. Pt eating diet and tolerating well

## 2018-05-20 NOTE — ED Notes (Signed)
Pt c/o pain at right iknee. State her normal arthritis pain. Knee repositioned with towels.

## 2018-05-20 NOTE — Progress Notes (Signed)
DAILY PROGRESS NOTE   Patient Name: Rhonda [name] Date of Encounter: 05/20/2018 Cardiologist: No primary care provider on file.  Chief Complaint   No issues  Patient Profile   85 yo female with recurrent a-fib with RVR.  Subjective   No issues overnight. Remains in afib with reasonable controlled ventricular response. Started on flecainide today.   Objective   Vitals:   05/20/18 0515 05/20/18 0700 05/20/18 0745 05/20/18 0800  BP: 108/73 108/77  109/79  Pulse:      Resp: 19 (!) 34 (!) 22 18  Temp:      SpO2:   96% 97%    Intake/Output Summary (Last 24 hours) at 05/20/2018 0829 Last data filed at 05/19/2018 1802 Gross per 24 hour  Intake 500 ml  Output -  Net 500 ml   There were no vitals filed for this visit.  Physical Exam   General appearance: alert and no distress Lungs: clear to auscultation bilaterally Heart: irregularly irregular rhythm Extremities: extremities normal, atraumatic, no cyanosis or edema Neurologic: Grossly normal  Inpatient Medications    Scheduled Meds: . aspirin EC  81 mg Oral Daily  . flecainide  50 mg Oral Q12H  . sodium chloride flush  3 mL Intravenous Q12H    Continuous Infusions: . sodium chloride    . sodium chloride Stopped (05/20/18 0310)  . diltiazem (CARDIZEM) infusion 10 mg/hr (05/20/18 0511)    PRN Meds: sodium chloride, acetaminophen, acetaminophen, nitroGLYCERIN, ondansetron (ZOFRAN) IV, sodium chloride flush   Labs   Results for orders placed or performed during the hospital encounter of 05/19/18 (from the past 48 hour(s))  Basic metabolic panel     Status: Abnormal   Collection Time: 05/19/18  5:11 PM  Result Value Ref Range   Sodium 136 135 - 145 mmol/L   Potassium 3.6 3.5 - 5.1 mmol/L   Chloride 101 98 - 111 mmol/L   CO2 22 22 - 32 mmol/L   Glucose, Bld 110 (H) 70 - 99 mg/dL   BUN 23 8 - 23 mg/dL   Creatinine, Ser 1.07 (H) 0.44 - 1.00 mg/dL   Calcium 8.8 (L) 8.9 - 10.3 mg/dL   GFR calc non Af Amer  48 (L) >60 mL/min   GFR calc Af Amer 56 (L) >60 mL/min   Anion gap 13 5 - 15    Comment: Performed at [facility], [address] [place], [place] [address]  CBC     Status: Abnormal   Collection Time: 05/19/18  5:11 PM  Result Value Ref Range   WBC 9.4 4.0 - 10.5 K/uL   RBC 5.08 3.87 - 5.11 MIL/uL   Hemoglobin 14.7 12.0 - 15.0 g/dL   HCT 47.5 (H) 36.0 - 46.0 %   MCV 93.5 80.0 - 100.0 fL   MCH 28.9 26.0 - 34.0 pg   MCHC 30.9 30.0 - 36.0 g/dL   RDW 13.0 11.5 - 15.5 %   Platelets 285 150 - 400 K/uL   nRBC 0.0 0.0 - 0.2 %    Comment: Performed at [facility], [place] [address], [place], [place] [address]  I-stat troponin, ED     Status: None   Collection Time: 05/19/18  5:30 PM  Result Value Ref Range   Troponin i, poc 0.01 0.00 - 0.08 ng/mL   Comment 3            Comment: Due to the release kinetics of cTnI, a negative result within the first  hours of the onset of symptoms does not rule out myocardial infarction with certainty. If myocardial infarction is still suspected, repeat the test at appropriate intervals.   Basic metabolic panel     Status: Abnormal   Collection Time: 05/20/18  2:00 AM  Result Value Ref Range   Sodium 137 135 - 145 mmol/L   Potassium 4.3 3.5 - 5.1 mmol/L   Chloride 103 98 - 111 mmol/L   CO2 14 (L) 22 - 32 mmol/L   Glucose, Bld 78 70 - 99 mg/dL   BUN 24 (H) 8 - 23 mg/dL   Creatinine, Ser 1.03 (H) 0.44 - 1.00 mg/dL   Calcium 9.1 8.9 - 10.3 mg/dL   GFR calc non Af Amer 50 (L) >60 mL/min   GFR calc Af Amer 58 (L) >60 mL/min   Anion gap 20 (H) 5 - 15    Comment: Performed at [facility], [place] [address], [place], [place] [address]    ECG   N/A  Telemetry   Afib with RVR - Personally Reviewed  Radiology    Dg Chest Port 1 View  Result Date: 05/19/2018 CLINICAL DATA:  85 year old female with a history of palpitations EXAM: PORTABLE CHEST 1 VIEW COMPARISON:  05/11/2018 FINDINGS: Cardiomediastinal silhouette unchanged in size  and contour. Persisting elevation of the right hemidiaphragm. Double density of the lower mediastinum is unchanged. No pneumothorax. No pleural effusion. No new confluent airspace disease. No displaced fracture IMPRESSION: Chronic lung changes with no evidence of acute cardiopulmonary disease. Redemonstration of elevation of the right hemidiaphragm. Hiatal hernia Electronically Signed   By: Corrie Mckusick D.O.   On: 05/19/2018 17:45    Cardiac Studies   N/A  Assessment   1. Active Problems: 2.   Atrial fibrillation with RVR ([place]) 3.   Atrial fibrillation with rapid ventricular response ([place]) 4.   Plan   1. Rate control improved. Started on flecainide. Cannot accomodate cardioversion today. Will schedule for tomorrow. Ok to eat today. Check echocardiogram today as well. NPO p MN for DCCV tomorrow.  Time Spent Directly with Patient:  I have spent a total of 25 minutes with the patient reviewing hospital notes, telemetry, EKGs, labs and examining the patient as well as establishing an assessment and plan that was discussed personally with the patient.  > 50% of time was spent in direct patient care.  Length of Stay:  LOS: 1 day   Pixie Casino, MD, [facility] [place], [place] Director of the [facility] Lipid Disorders &  Cardi[facility] Reduction [facility] Diplomate of the American Board of Clinical Lipidology Attending Cardiologist  Direct Dial: [phone]  Fax: [phone]  Website:  [email or web address] 05/20/2018, 8:29 AM

## 2018-05-20 NOTE — ED Notes (Signed)
Report to  tdo during down time.

## 2018-05-20 NOTE — Progress Notes (Signed)
  Echocardiogram 2D Echocardiogram has been performed.  Darlina Sicilian M 05/20/2018, 1:56 PM

## 2018-05-21 ENCOUNTER — Ambulatory Visit: Payer: PPO | Admitting: Family Medicine

## 2018-05-21 ENCOUNTER — Encounter (HOSPITAL_COMMUNITY)
Admission: EM | Disposition: A | Discharge status: Home / Self Care | Payer: Self-pay | Source: Home / Self Care | Attending: Internal Medicine

## 2018-05-21 SURGERY — ECHOCARDIOGRAM, TRANSESOPHAGEAL
Anesthesia: Monitor Anesthesia Care

## 2018-05-21 MED ORDER — FLECAINIDE ACETATE 50 MG PO TABS: 50.0000 mg | ORAL_TABLET | Freq: Two times a day (BID) | ORAL | 3 refills | Status: DC

## 2018-05-21 MED ORDER — DILTIAZEM HCL ER COATED BEADS 180 MG PO CP24: 180.0000 mg | ORAL_CAPSULE | Freq: Every day | ORAL | 2 refills | Status: DC

## 2018-05-21 MED ORDER — FLECAINIDE ACETATE 50 MG PO TABS: 50.0000 mg | ORAL_TABLET | Freq: Two times a day (BID) | ORAL | 1 refills | Status: DC

## 2018-05-21 MED ORDER — DILTIAZEM HCL ER COATED BEADS 180 MG PO CP24
180.0000 mg | ORAL_CAPSULE | Freq: Every day | ORAL | Status: DC
  Administered 2018-05-21: 180 mg via ORAL
  Filled 2018-05-21: qty 1

## 2018-05-21 MED ORDER — DILTIAZEM HCL ER COATED BEADS 180 MG PO CP24: 180.0000 mg | ORAL_CAPSULE | Freq: Every day | ORAL | 1 refills | Status: DC

## 2018-05-21 NOTE — Discharge Instructions (Signed)

## 2018-05-21 NOTE — Progress Notes (Signed)
Patient converted to sinus rhythm, EKG done and in file. Will continue to monitor patient.

## 2018-05-21 NOTE — Discharge Summary (Signed)
Discharge Summary    Patient ID: MAO LOCKNER MRN: [id number]; DOB: 1932/08/09  Admit date: 05/19/2018 Discharge date: 05/21/2018  Primary Care Provider: Dettinger, Fransisca Kaufmann, MD  Primary Cardiologist: [name], MD  Primary Electrophysiologist:  None   Discharge Diagnoses    Principal Problem:   Atrial fibrillation with RVR [facility])   Allergies No Known Allergies  Diagnostic Studies/Procedures    Echocardiogram 05/20/2018: Impressions:  1. Patient was in atrial fibrillation with RVR throughout the study, with rates between 110-140 bpm. This limits the sensitivity of the study for diagnostic wall motion abnormalities.  2. The left ventricle appears to be normal in size, has normal wall thickness 50-55% ejection fraction Spectral Doppler shows NA pattern of diastolic filling.  3. Right ventricular systolic pressure is is normal.  4. The right ventricle has normal size and normal systolic function.  5. Severely dilated left atrial size.  6. Moderately dilated right atrial size.  7. Mitral valve regurgitation is trivial by color flow Doppler.  8. The mitral valve normal in structure and function.  9. Normal tricuspid valve. 10. Tricuspid regurgitation moderate. 11. Aortic valve normal. 12. Mild dilatation of the aortic root. 13. No atrial level shunt detected by color flow Doppler.  Findings:  Left Ventricle: The left ventricle appears to be normal in size, has normal wall thickness low normal systolic function with a 23-55% ejection fraction Spectral Doppler shows NA pattern of diastolic filling. The left ventricular diastology could not be  evaluatedsecondary to atrial fibrillation. Right Ventricle: The right ventricle is normal in size normal wall thickness has normal systolic function. Right ventricular systolic pressure is is normal with an estimated pressure of 28.9 mmHg. Left Atrium: The left atrium is severely dilated. Right Atrium: The right atrial size is  moderately dilated. Interatrial Septum: No atrial level shunt detected by color flow Doppler.  Pericardium: There is no evidence of pericardial effusion. Mitral Valve: The mitral valve normal in structure and function. Mitral valve regurgitation is trivial by color flow Doppler. Tricuspid Valve: The tricuspid valve is normal in structure. Tricuspid regurgitation moderate by color flow Doppler. Aortic Valve: The aortic valve normal in structure and function. Pulmonic Valve: The pulmonic valve is normal. Pulmonic valve regurgitation is not visualized by color flow Doppler. Aorta: There is mild dilatation of the aortic root. Venous: The inferior vena cava was normal in size with greater than 50% respiratory variablity.    History of Present Illness     [name] is a 85 year old female who presented to our [place] office on 05/19/2018 for follow-up after being seen in the ED on 05/17/2018 for atrial fibrillation with RVR. She was brought to the ED via EMS after recording a heart rate of atrial fibrillation with rate of 220 bpm. She was given Adenosine in route to the ED and converted to normal sinus rhythm in the ED. She was continued on Diltiazem 120mg  daily and Eliquis 5mg  twice daily and discharged in stable condition.  At office follow-up on 05/19/2018, patient complained of "just feeling yuck" with racing heart rate. EKG in the office showed atrial fibrillation with rate of 160 bpm. Doctor of the Day, Dr. Oval Linsey, recommended patient be sent to the ED for admission and further cardiac workup.  Hospital Course     Consultants: None.  Patient admitted on 05/19/2018 for atrial fibrillation as stated above. Troponin negative x2. Patient was started on IV Diltiazem. Dr. Debara Pickett discussed patient with EP (Dr. Caryl Comes) who recommend trying Flecainide so  this was started as well. Echocardiogram showed LVEF of 50-55% with severely dilated left atrium and moderately dilated right atrial (see full report  above). Patient initially scheduled for DCCV today but patient spontaneously converted back to sinus rhythm overnight on 05/20/2018 and remains in sinus rhythm today. Patient seen and examined by Dr. Debara Pickett and felt to be stable for discharge. Will continue Flecainide 50mg  twice daily at discharge. Will increase home Diltiazem to 180mg  daily. Sent in 90-day supple of the Flecainide and Diltiazem per pharmacy's request. Continue chronic anticoagulation with Eliquis 5mg  twice daily. Outpatient follow-up has been arranged. Patient will likely need outpatient ischemic evaluation since this was not performed during this admission. Medications as below.  Discharge Vitals Blood pressure 105/67, pulse 69, temperature (!) 97.5 F (36.4 C), temperature source Oral, resp. rate (!) 25, weight 80.5 kg, SpO2 95 %.  Filed Weights   05/21/18 0409  Weight: 80.5 kg    Labs & Radiologic Studies    CBC Recent Labs    05/19/18 1711  WBC 9.4  HGB 14.7  HCT 47.5*  MCV 93.5  PLT 970   Basic Metabolic Panel Recent Labs    05/19/18 1711 05/20/18 0200  NA 136 137  K 3.6 4.3  CL 101 103  CO2 22 14*  GLUCOSE 110* 78  BUN 23 24*  CREATININE 1.07* 1.03*  CALCIUM 8.8* 9.1   Liver Function Tests Recent Labs    05/19/18 1728  AST 30  ALT 59*  ALKPHOS 52  BILITOT 0.8  PROT 6.4*  ALBUMIN 3.7   No results for input(s): LIPASE, AMYLASE in the last 72 hours. Cardiac Enzymes Recent Labs    05/19/18 1711  TROPONINI <0.03   BNP Invalid input(s): POCBNP D-Dimer No results for input(s): DDIMER in the last 72 hours. Hemoglobin A1C No results for input(s): HGBA1C in the last 72 hours. Fasting Lipid Panel No results for input(s): CHOL, HDL, LDLCALC, TRIG, CHOLHDL, LDLDIRECT in the last 72 hours. Thyroid Function Tests No results for input(s): TSH, T4TOTAL, T3FREE, THYROIDAB in the last 72 hours.  Invalid input(s): FREET3 _____________  Dg Chest 2 View  Result Date: 05/11/2018 CLINICAL DATA:   Patient not feeling well. EXAM: CHEST - 2 VIEW COMPARISON:  June 17, 2015 FINDINGS: Mild scar atelectasis in the left base. The heart, hila, mediastinum, lungs, and pleura are unremarkable. IMPRESSION: No active cardiopulmonary disease. Electronically Signed   By: Dorise Bullion III M.D   On: 05/11/2018 10:44   Dg Chest Port 1 View  Result Date: 05/19/2018 CLINICAL DATA:  85 year old female with a history of palpitations EXAM: PORTABLE CHEST 1 VIEW COMPARISON:  05/11/2018 FINDINGS: Cardiomediastinal silhouette unchanged in size and contour. Persisting elevation of the right hemidiaphragm. Double density of the lower mediastinum is unchanged. No pneumothorax. No pleural effusion. No new confluent airspace disease. No displaced fracture IMPRESSION: Chronic lung changes with no evidence of acute cardiopulmonary disease. Redemonstration of elevation of the right hemidiaphragm. Hiatal hernia Electronically Signed   By: Corrie Mckusick D.O.   On: 05/19/2018 17:45   Disposition   Patient is being discharged home today in good condition.  Follow-up Plans & Appointments    Follow-up Information    Barrett, Evelene Croon, PA-C Follow up.   Specialties:  Cardiology, Radiology Why:  You have a follow up appointment scheduled for 06/03/2018 at 8:00 with Rosaria Ferries, one of Dr. Rosezella Florida PAs. Contact information: [address] [place] [place] [address] [phone]  Discharge Instructions    Diet - low sodium heart healthy   Complete by:  As directed    Increase activity slowly   Complete by:  As directed       Discharge Medications   Allergies as of 05/21/2018   No Known Allergies     Medication List    TAKE these medications   acetaminophen 650 MG CR tablet Commonly known as:  TYLENOL Take 650 mg by mouth every 8 (eight) hours as needed for pain.   diltiazem 180 MG 24 hr capsule Commonly known as:  CARDIZEM CD Take 1 capsule (180 mg total) by mouth daily. What  changed:    medication strength  See the new instructions.   ELIQUIS 5 MG Tabs tablet Generic drug:  apixaban Take 5 mg by mouth 2 (two) times daily.   flecainide 50 MG tablet Commonly known as:  TAMBOCOR Take 1 tablet (50 mg total) by mouth 2 (two) times daily.   zolpidem 5 MG tablet Commonly known as:  AMBIEN Take 5 mg by mouth at bedtime as needed for sleep.          Outstanding Labs/Studies   None.  Duration of Discharge Encounter   Greater than 30 minutes including physician time.  Signed, Darreld Mclean, PA-C 05/21/2018, 1:53 PM

## 2018-05-21 NOTE — Progress Notes (Addendum)
DAILY PROGRESS NOTE   Patient Name: Rhonda [name] Date of Encounter: 05/21/2018 Cardiologist: No primary care provider on file.  Chief Complaint   No issues  Patient Profile   85 yo female with recurrent a-fib with RVR.  Subjective   Converted to sinus rhythm overnight. Feels better today.  Objective   Vitals:   05/21/18 0435 05/21/18 0450 05/21/18 0505 05/21/18 0520  BP: 105/61 114/71 125/67 122/77  Pulse:      Resp: (!) 21 17 (!) 22 (!) 25  Temp:      TempSrc:      SpO2:      Weight:        Intake/Output Summary (Last 24 hours) at 05/21/2018 1112 Last data filed at 05/20/2018 1900 Gross per 24 hour  Intake 1631.05 ml  Output -  Net 1631.05 ml   Filed Weights   05/21/18 0409  Weight: 80.5 kg    Physical Exam   General appearance: alert and no distress Lungs: clear to auscultation bilaterally Heart: regular rate and rhythm Extremities: extremities normal, atraumatic, no cyanosis or edema Neurologic: Grossly normal  Inpatient Medications    Scheduled Meds: . apixaban  5 mg Oral BID  . flecainide  50 mg Oral Q12H    Continuous Infusions: . sodium chloride 75 mL/hr (05/21/18 0308)  . diltiazem (CARDIZEM) infusion 10 mg/hr (05/21/18 0307)    PRN Meds: acetaminophen, ondansetron (ZOFRAN) IV, zolpidem   Labs   Results for orders placed or performed during the hospital encounter of 05/19/18 (from the past 48 hour(s))  Basic metabolic panel     Status: Abnormal   Collection Time: 05/19/18  5:11 PM  Result Value Ref Range   Sodium 136 135 - 145 mmol/L   Potassium 3.6 3.5 - 5.1 mmol/L   Chloride 101 98 - 111 mmol/L   CO2 22 22 - 32 mmol/L   Glucose, Bld 110 (H) 70 - 99 mg/dL   BUN 23 8 - 23 mg/dL   Creatinine, Ser 1.07 (H) 0.44 - 1.00 mg/dL   Calcium 8.8 (L) 8.9 - 10.3 mg/dL   GFR calc non Af Amer 48 (L) >60 mL/min   GFR calc Af Amer 56 (L) >60 mL/min   Anion gap 13 5 - 15    Comment: Performed at [facility], [address]  [place], [place] [address]  CBC     Status: Abnormal   Collection Time: 05/19/18  5:11 PM  Result Value Ref Range   WBC 9.4 4.0 - 10.5 K/uL   RBC 5.08 3.87 - 5.11 MIL/uL   Hemoglobin 14.7 12.0 - 15.0 g/dL   HCT 47.5 (H) 36.0 - 46.0 %   MCV 93.5 80.0 - 100.0 fL   MCH 28.9 26.0 - 34.0 pg   MCHC 30.9 30.0 - 36.0 g/dL   RDW 13.0 11.5 - 15.5 %   Platelets 285 150 - 400 K/uL   nRBC 0.0 0.0 - 0.2 %    Comment: Performed at [facility], [place] [address] [place], [place] [address]  I-stat troponin, ED     Status: None   Collection Time: 05/19/18  5:30 PM  Result Value Ref Range   Troponin i, poc 0.01 0.00 - 0.08 ng/mL   Comment 3            Comment: Due to the release kinetics of cTnI, a negative result within the first hours of the onset of symptoms does not rule out myocardial infarction  with certainty. If myocardial infarction is still suspected, repeat the test at appropriate intervals.   Basic metabolic panel     Status: Abnormal   Collection Time: 05/20/18  2:00 AM  Result Value Ref Range   Sodium 137 135 - 145 mmol/L   Potassium 4.3 3.5 - 5.1 mmol/L   Chloride 103 98 - 111 mmol/L   CO2 14 (L) 22 - 32 mmol/L   Glucose, Bld 78 70 - 99 mg/dL   BUN 24 (H) 8 - 23 mg/dL   Creatinine, Ser 1.03 (H) 0.44 - 1.00 mg/dL   Calcium 9.1 8.9 - 10.3 mg/dL   GFR calc non Af Amer 50 (L) >60 mL/min   GFR calc Af Amer 58 (L) >60 mL/min   Anion gap 20 (H) 5 - 15    Comment: Performed at [facility], [place] [address] [place], [place] [address]    ECG   N/A  Telemetry   Sinus rhythm - Personally Reviewed  Radiology    Dg Chest Port 1 View  Result Date: 05/19/2018 CLINICAL DATA:  85 year old female with a history of palpitations EXAM: PORTABLE CHEST 1 VIEW COMPARISON:  05/11/2018 FINDINGS: Cardiomediastinal silhouette unchanged in size and contour. Persisting elevation of the right hemidiaphragm. Double density of the lower mediastinum is unchanged. No pneumothorax. No pleural  effusion. No new confluent airspace disease. No displaced fracture IMPRESSION: Chronic lung changes with no evidence of acute cardiopulmonary disease. Redemonstration of elevation of the right hemidiaphragm. Hiatal hernia Electronically Signed   By: Corrie Mckusick D.O.   On: 05/19/2018 17:45    Cardiac Studies   N/A  Assessment   Active Problems:   Atrial fibrillation with RVR (HCC)   Atrial fibrillation with rapid ventricular response (HCC)   Plan   1. Converted to sinus rhythm overnight. Will continue flecainide 50 mg BID. D/c IV diltiazem and switch to diltiazem 180 mg CD daily (she has some of this medication at home, but was reduced to 120 mg daily prior to admission). Bromide for d/c home today. Follow-up with Dr. Percival Spanish either at [facility], [place] (saw him there before) or at [facility]. Will need outpatient ischemia testing on flecainide since this was not performed here.  Time Spent Directly with Patient:  I have spent a total of 25 minutes with the patient reviewing hospital notes, telemetry, EKGs, labs and examining the patient as well as establishing an assessment and plan that was discussed personally with the patient.  > 50% of time was spent in direct patient care.  Length of Stay:  LOS: 2 days   Pixie Casino, MD, [facility], [place] Director of the Advanced Lipid Disorders &  Cardiovascular Risk Reduction Clinic Diplomate of the American Board of Clinical Lipidology Attending Cardiologist  Direct Dial: [phone]  Fax: [phone]  Website:  [email or web address] 05/21/2018, 11:12 AM

## 2018-06-03 ENCOUNTER — Encounter: Payer: Self-pay | Admitting: Physician Assistant

## 2018-06-03 ENCOUNTER — Ambulatory Visit: Payer: PPO | Admitting: Physician Assistant

## 2018-06-03 ENCOUNTER — Telehealth (HOSPITAL_COMMUNITY): Payer: Self-pay

## 2018-06-03 VITALS — BP 162/84 | HR 74 | Ht 65.0 in | Wt 171.4 lb

## 2018-06-03 DIAGNOSIS — I4891 Unspecified atrial fibrillation: Secondary | ICD-10-CM

## 2018-06-03 DIAGNOSIS — Z7901 Long term (current) use of anticoagulants: Secondary | ICD-10-CM

## 2018-06-03 DIAGNOSIS — I4819 Other persistent atrial fibrillation: Secondary | ICD-10-CM

## 2018-06-03 DIAGNOSIS — I10 Essential (primary) hypertension: Secondary | ICD-10-CM | POA: Diagnosis not present

## 2018-06-03 MED ORDER — DILTIAZEM HCL ER COATED BEADS 240 MG PO CP24: 240.0000 mg | ORAL_CAPSULE | Freq: Every day | ORAL | 3 refills | Status: DC

## 2018-06-03 NOTE — Progress Notes (Signed)
Cardiology Office Note   Date:  06/03/2018   ID:  Rhonda [name], DOB 1932/11/05, MRN [id number]  PCP:  Dettinger, Fransisca Kaufmann, MD Cardiologist:  [name], MD 09/18/2017 Jory Sims, DNP, 05/19/2018 Rosaria Ferries, PA-C   No chief complaint on file.   History of Present Illness: Rhonda [name] is a 85 y.o. female with a history of osteopenia, Afib dx 05/17/2018  Admitted 1/27-1/29/2020 with atrial fibrillation, RVR, discharged on flecainide, Eliquis and diltiazem.  Consider ischemic evaluation as an outpatient, CHA2DS2-VASc = 3 (age x 2, female)  Rhonda [name] presents for cardiology follow up.  She has done well since d/c, HR has been good.   She is happy that she did not have to be cardioverted.   She is increasing her activity. No chest pain or SOB with this.   She was having spells of weakness and presyncope, they would resolve spontaneously. None of those since d/c. The dizziness she was having has also resolved.   She has never had chest pain. Denies DOE, feels her activity is more limited by arthritis and MS issues than by SOB.   Has been compliant with meds, no missed doses.  No bleeding issues on the Eliquis  She took her medications early this morning.    Past Medical History:  Diagnosis Date  . Arthritis   . Atrial fibrillation ([place]) 05/2015  . Osteopenia   . Syncope 04/2014    Past Surgical History:  Procedure Laterality Date  . BREAST EXCISIONAL BIOPSY Left   . BREAST EXCISIONAL BIOPSY Right   . COLONOSCOPY  2011  . KNEE ARTHROCENTESIS Bilateral   . POLYPECTOMY  11-01-2009  . ROTATOR CUFF REPAIR    . SHOULDER SURGERY Left   . TOTAL KNEE ARTHROPLASTY Left   . VAGINAL HYSTERECTOMY     twice - vaginal and then oopherectomy    Current Outpatient Medications  Medication Sig Dispense Refill  . acetaminophen (TYLENOL) 650 MG CR tablet Take 650 mg by mouth every 8 (eight) hours as needed for pain.    Marland Kitchen apixaban (ELIQUIS) 5 MG TABS tablet  Take 5 mg by mouth 2 (two) times daily.    . flecainide (TAMBOCOR) 50 MG tablet Take 1 tablet (50 mg total) by mouth 2 (two) times daily. 180 tablet 1  . zolpidem (AMBIEN) 5 MG tablet Take 5 mg by mouth at bedtime as needed for sleep.    Marland Kitchen diltiazem (CARDIZEM CD) 180 MG 24 hr capsule Take 1 capsule (180 mg total) by mouth daily. 90 capsule 3   No current facility-administered medications for this visit.     Allergies:   Patient has no known allergies.    Social History:  The patient  reports that she has never smoked. She has never used smokeless tobacco. She reports that she does not drink alcohol or use drugs.   Family History:  The patient's family history includes Arthritis in her father and sister; Bladder Cancer in her brother; Cancer in her brother; Diabetes in her brother; Heart failure in her mother; Hypertension in her brother, father, and sister.  She indicated that her mother is deceased. She indicated that her father is deceased. She indicated that her sister is deceased. She indicated that both of her brothers are deceased. She indicated that her maternal grandmother is deceased. She indicated that her maternal grandfather is deceased. She indicated that her paternal grandmother is deceased. She indicated that her paternal grandfather is deceased.   ROS:  Please see the history of present illness. All other systems are reviewed and negative.    PHYSICAL EXAM: VS:  BP (!) 162/84   Pulse 74   Ht 5\' 5"  (1.651 m)   Wt 171 lb 6.4 oz (77.7 kg)   BMI 28.52 kg/m  , BMI Body mass index is 28.52 kg/m. GEN: Well nourished, well developed, female in no acute distress HEENT: normal for age  Neck: no JVD, no carotid bruit, no masses Cardiac: RRR; soft murmur, no rubs, or gallops Respiratory:  clear to auscultation bilaterally, normal work of breathing GI: soft, nontender, nondistended, + BS MS: no deformity or atrophy; no edema; Legs are tender to palpation, distal pulses are 2+ in  all 4 extremities  Skin: warm and dry, no rash Neuro:  Strength and sensation are intact Psych: euthymic mood, full affect   EKG:  EKG is ordered today. The ekg ordered today demonstrates SR, HR 74. No acute ischemic changes   Echocardiogram 05/20/2018: Impressions: 1. Patient was in atrial fibrillation with RVR throughout the study, with rates between 110-140 bpm. This limits the sensitivity of the study for diagnostic wall motion abnormalities. 2. The left ventricle appears to be normal in size, has normal wall thickness 50-55% ejection fraction Spectral Doppler shows NA pattern of diastolic filling. 3. Right ventricular systolic pressure is is normal. 4. The right ventricle has normal size and normal systolic function. 5. Severely dilated left atrial size. 6. Moderately dilated right atrial size. 7. Mitral valve regurgitation is trivial by color flow Doppler. 8. The mitral valve normal in structure and function. 9. Normal tricuspid valve. 10. Tricuspid regurgitation moderate. 11. Aortic valve normal. 12. Mild dilatation of the aortic root. 13. No atrial level shunt detected by color flow Doppler.  Findings: Left Ventricle: The left ventricle appears to be normal in size, has normal wall thickness low normal systolic function with a 30-07% ejection fraction Spectral Doppler shows NA pattern of diastolic filling. The left ventricular diastology could not be  evaluatedsecondary to atrial fibrillation. Right Ventricle: The right ventricle is normal in size normal wall thickness has normal systolic function. Right ventricular systolic pressure is is normal with an estimated pressure of 28.9 mmHg. Left Atrium: The left atrium is severely dilated. Right Atrium: The right atrial size is moderately dilated. Interatrial Septum: No atrial level shunt detected by color flow Doppler.  Pericardium: There is no evidence of pericardial effusion. Mitral Valve: The mitral valve normal  in structure and function. Mitral valve regurgitation is trivial by color flow Doppler. Tricuspid Valve: The tricuspid valve is normal in structure. Tricuspid regurgitation moderate by color flow Doppler. Aortic Valve: The aortic valve normal in structure and function. Pulmonic Valve: The pulmonic valve is normal. Pulmonic valve regurgitation is not visualized by color flow Doppler. Aorta: There is mild dilatation of the aortic root. Venous: The inferior vena cava was normal in size with greater than 50% respiratory variablity.   Recent Labs: 05/19/2018: ALT 59; Hemoglobin 14.7; Platelets 285 05/20/2018: BUN 24; Creatinine, Ser 1.03; Potassium 4.3; Sodium 137  CBC    Component Value Date/Time   WBC 9.4 05/19/2018 1711   RBC 5.08 05/19/2018 1711   HGB 14.7 05/19/2018 1711   HGB 14.7 11/18/2017 0932   HCT 47.5 (H) 05/19/2018 1711   HCT 43.3 11/18/2017 0932   PLT 285 05/19/2018 1711   PLT 274 11/18/2017 0932   MCV 93.5 05/19/2018 1711   MCV 90 11/18/2017 0932   MCH 28.9 05/19/2018 1711  MCHC 30.9 05/19/2018 1711   RDW 13.0 05/19/2018 1711   RDW 12.8 11/18/2017 0932   LYMPHSABS 1.3 05/11/2018 1020   LYMPHSABS 1.3 11/18/2017 0932   MONOABS 0.7 05/11/2018 1020   EOSABS 0.1 05/11/2018 1020   EOSABS 0.1 11/18/2017 0932   BASOSABS 0.1 05/11/2018 1020   BASOSABS 0.1 11/18/2017 0932   CMP Latest Ref Rng & Units 05/20/2018 05/19/2018 05/11/2018  Glucose 70 - 99 mg/dL 78 110(H) 119(H)  BUN 8 - 23 mg/dL 24(H) 23 16  Creatinine 0.44 - 1.00 mg/dL 1.03(H) 1.07(H) 1.05(H)  Sodium 135 - 145 mmol/L 137 136 137  Potassium 3.5 - 5.1 mmol/L 4.3 3.6 3.8  Chloride 98 - 111 mmol/L 103 101 104  CO2 22 - 32 mmol/L 14(L) 22 23  Calcium 8.9 - 10.3 mg/dL 9.1 8.8(L) 9.0  Total Protein 6.5 - 8.1 g/dL - 6.4(L) -  Total Bilirubin 0.3 - 1.2 mg/dL - 0.8 -  Alkaline Phos 38 - 126 U/L - 52 -  AST 15 - 41 U/L - 30 -  ALT 0 - 44 U/L - 59(H) -     Lipid Panel Lab Results  Component Value Date   CHOL 163  11/18/2017   HDL 45 11/18/2017   LDLCALC 89 11/18/2017   TRIG 145 11/18/2017   CHOLHDL 3.6 11/18/2017      Wt Readings from Last 3 Encounters:  06/03/18 171 lb 6.4 oz (77.7 kg)  05/21/18 177 lb 8 oz (80.5 kg)  05/19/18 175 lb 6.4 oz (79.6 kg)     Other studies Reviewed: Additional studies/ records that were reviewed today include: office notes, hospital records and testing.  ASSESSMENT AND PLAN:  1.  Persistent atrial fibrillation: She spontaneously converted to sinus rhythm on diltiazem and flecainide. -She is maintaining sinus rhythm. - increase Diltiazem 180 mg >>240 mg qd for better blood pressure and heart rate control. Ok to take 120 mg capsules bid  -Let us know if she does not tolerate this dose, if not, go back to the previous dose. -Because the flecainide, schedule an ETT.  Take both the flecainide and Cardizem the morning of the test.  Because of her musculoskeletal limitations, her heart rate may not hit target, or she may not be able to walk on the treadmill for very long.  I spoke to [place], [place] PA, and she states that if the patient does not reach target, but exerts herself as much as she is able, that is okay.  2.  Chronic anticoagulation: She has not missed any doses of her Eliquis, continue this  3.  Hypertension: I believe that her blood pressure is not well controlled at baseline, she does not check it regularly at home.  Hopefully the increased Cardizem will help with blood pressure control   Current medicines are reviewed at length with the patient today.  The patient does not have concerns regarding medicines.  The following changes have been made: Increase Cardizem  Labs/ tests ordered today include:   Orders Placed This Encounter  Procedures  . Exercise Tolerance Test  . EKG 12-Lead     Disposition:   FU with [name], MD  Signed, Rosaria Ferries, PA-C  06/03/2018 10:03 AM    [facility] Phone: [phone];  Fax: [phone]

## 2018-06-03 NOTE — Patient Instructions (Signed)
Medication Instructions:   Increase Diltiazem to 240 mg daily. (OK to take two 120 mg tablets daily until you run out of those; then start new Rx for 240 mg)  If you need a refill on your cardiac medications before your next appointment, please call your pharmacy.   Testing/Procedures: Your physician has requested that you have an exercise tolerance test. For further information please visit [email or web address]. Please also follow instruction sheet, as given.  (We will call you if we find another option)  Follow-Up: At [facility], you and your health needs are our priority.  As part of our continuing mission to provide you with exceptional heart care, we have created designated Provider Care Teams.  These Care Teams include your primary Cardiologist (physician) and Advanced Practice Providers (APPs -  Physician Assistants and Nurse Practitioners) who all work together to provide you with the care you need, when you need it. . Please schedule an appointment with Dr. Percival Spanish in [place] office in 2 months.  Any Other Special Instructions Will Be Listed Below (If Applicable). Please sign up for MyChart   Your physician has requested that you regularly monitor and record your blood pressure readings at home. Please use the same machine at the same time of day to check your readings and record them to bring to your follow-up visit.

## 2018-06-03 NOTE — Telephone Encounter (Signed)
Encounter complete. 

## 2018-06-05 ENCOUNTER — Ambulatory Visit (HOSPITAL_COMMUNITY)
Admission: RE | Admit: 2018-06-05 | Discharge: 2018-06-05 | Disposition: A | Discharge status: Home / Self Care | Payer: PPO | Source: Ambulatory Visit | Attending: Cardiology | Admitting: Cardiology

## 2018-06-05 DIAGNOSIS — I4891 Unspecified atrial fibrillation: Secondary | ICD-10-CM | POA: Diagnosis not present

## 2018-06-05 LAB — EXERCISE TOLERANCE TEST
Estimated workload: 4.4 METS
Exercise duration (min): 1 min
Exercise duration (sec): 56 s
MPHR: 137 {beats}/min
Peak HR: 101 {beats}/min
Percent HR: 73 %
RPE: 19
Rest HR: 73 {beats}/min

## 2018-06-06 ENCOUNTER — Telehealth: Payer: Self-pay | Admitting: Physician Assistant

## 2018-06-06 NOTE — Telephone Encounter (Signed)
Result Notes for Exercise Tolerance Test   Notes recorded by Therisa Doyne on 06/06/2018 at 8:42 AM EST Results given to pt. Pt verbalized understanding. Pt stated she has been recording her BP since med changed at last ov with Suanne Marker. She reported BPs of 149/77 the first day, 146/77 the second day with HR of 78, and 127/62 yesterday with HR of 79. Pt states she is not having any symptoms. Pt stated she has not been able to sign up for MyChart since her son is out of town and she does not have a Teaching laboratory technician. She does not know when her son will be returning and he lives far away from her. I told pt to continue recording her BP and HR until her appt with Dr. Percival Spanish and bring recordings to that appt and to call if she notices her BP is running high or if dropping too low and she is getting dizzy, or having headaches. She stated she also has an appt with her PCP at the end of this month and verbalized understanding. Verbalized thanks for the call. ------  Notes recorded by Barrett, Evelene Croon, PA-C on 06/05/2018 at 6:10 PM EST Please let her know that she did fine on her treadmill. She is tolerating the flecainide very well. Thanks

## 2018-06-09 ENCOUNTER — Other Ambulatory Visit: Payer: Self-pay | Admitting: Cardiology

## 2018-06-17 ENCOUNTER — Other Ambulatory Visit: Payer: Self-pay | Admitting: Family Medicine

## 2018-06-19 ENCOUNTER — Encounter: Payer: Self-pay | Admitting: Family Medicine

## 2018-06-19 ENCOUNTER — Ambulatory Visit: Payer: PPO

## 2018-06-19 ENCOUNTER — Ambulatory Visit (INDEPENDENT_AMBULATORY_CARE_PROVIDER_SITE_OTHER): Payer: PPO | Admitting: Family Medicine

## 2018-06-19 ENCOUNTER — Other Ambulatory Visit: Payer: Self-pay | Admitting: Family Medicine

## 2018-06-19 VITALS — BP 136/76 | HR 78 | Temp 96.8°F | Ht 65.0 in | Wt 171.8 lb

## 2018-06-19 DIAGNOSIS — R7989 Other specified abnormal findings of blood chemistry: Secondary | ICD-10-CM | POA: Diagnosis not present

## 2018-06-19 DIAGNOSIS — G4700 Insomnia, unspecified: Secondary | ICD-10-CM | POA: Insufficient documentation

## 2018-06-19 DIAGNOSIS — E663 Overweight: Secondary | ICD-10-CM

## 2018-06-19 DIAGNOSIS — M8580 Other specified disorders of bone density and structure, unspecified site: Secondary | ICD-10-CM

## 2018-06-19 DIAGNOSIS — Z13820 Encounter for screening for osteoporosis: Secondary | ICD-10-CM | POA: Diagnosis not present

## 2018-06-19 DIAGNOSIS — F5101 Primary insomnia: Secondary | ICD-10-CM | POA: Diagnosis not present

## 2018-06-19 DIAGNOSIS — I4811 Longstanding persistent atrial fibrillation: Secondary | ICD-10-CM | POA: Diagnosis not present

## 2018-06-19 MED ORDER — ZOLPIDEM TARTRATE 5 MG PO TABS: 5.0000 mg | ORAL_TABLET | Freq: Every evening | ORAL | 5 refills | Status: DC | PRN

## 2018-06-19 MED ORDER — FLECAINIDE ACETATE 50 MG PO TABS: 50.0000 mg | ORAL_TABLET | Freq: Two times a day (BID) | ORAL | 1 refills | Status: DC

## 2018-06-19 NOTE — Progress Notes (Signed)
BP 136/76   Pulse 78   Temp (!) 96.8 F (36 C) (Oral)   Ht _0  (1.651 m)   Wt 171 lb 12.8 oz (77.9 kg)   BMI 28.59 kg/m    Subjective:    Patient ID: Rhonda [name], female    DOB: 1932/09/28, 85 y.o.   MRN: [id number]  HPI: Rhonda [name] is a 85 y.o. female presenting on 06/19/2018 for Atrial Fibrillation (6 month follow up of chronic medical conditions- Patient went to ER [facility] 1/27 of afib)   HPI Insomnia Patient is coming in to get a refill of medication for insomnia.  She has been out of Ambien and she has not been sleeping as well and wants a refill for this.  She says the Ambien has worked for her for quite some time and she like to continue it.  Patient was recently hospitalized for A. fib and RVR about 1 month ago.  She said her symptoms that she was having was near syncope or lightheadedness.  She has had follow-up since leaving the hospital with cardiology and they have adjusted her Cardizem.  Her heart rate today is 78 and her blood pressure is 136/76.  She denies any chest pain or palpitations or lightheadedness or dizziness.  Patient has a history of low serum vitamin D and needs recheck today and we will recheck that with her labs today.  Patient is also due for a bone density scan  Relevant past medical, surgical, family and social history reviewed and updated as indicated. Interim medical history since our last visit reviewed. Allergies and medications reviewed and updated.  Review of Systems  Constitutional: Negative for chills and fever.  Eyes: Negative for redness and visual disturbance.  Respiratory: Negative for chest tightness and shortness of breath.   Cardiovascular: Negative for chest pain and leg swelling.  Musculoskeletal: Negative for back pain and gait problem.  Skin: Negative for rash.  Neurological: Negative for dizziness, weakness, light-headedness, numbness and headaches.  Psychiatric/Behavioral: Positive for sleep disturbance. Negative for  agitation, behavioral problems, dysphoric mood, self-injury and suicidal ideas. The patient is not nervous/anxious.   All other systems reviewed and are negative.   Per HPI unless specifically indicated above   Allergies as of 06/19/2018   No Known Allergies     Medication List       Accurate as of June 19, 2018  9:47 AM. Always use your most recent med list.        acetaminophen 650 MG CR tablet Commonly known as:  TYLENOL Take 650 mg by mouth every 8 (eight) hours as needed for pain.   diltiazem 240 MG 24 hr capsule Commonly known as:  CARDIZEM CD Take 1 capsule (240 mg total) by mouth daily.   ELIQUIS 5 MG Tabs tablet Generic drug:  apixaban TAKE ONE TABLET BY MOUTH TWICE DAILY   flecainide 50 MG tablet Commonly known as:  TAMBOCOR Take 1 tablet (50 mg total) by mouth 2 (two) times daily.   zolpidem 5 MG tablet Commonly known as:  AMBIEN Take 1 tablet (5 mg total) by mouth at bedtime as needed for sleep.          Objective:    BP 136/76   Pulse 78   Temp (!) 96.8 F (36 C) (Oral)   Ht _1  (1.651 m)   Wt 171 lb 12.8 oz (77.9 kg)   BMI 28.59 kg/m   Wt Readings from Last 3 Encounters:  06/19/18  171 lb 12.8 oz (77.9 kg)  06/03/18 171 lb 6.4 oz (77.7 kg)  05/21/18 177 lb 8 oz (80.5 kg)    Physical Exam Vitals signs and nursing note reviewed.  Constitutional:      General: She is not in acute distress.    Appearance: She is well-developed. She is not diaphoretic.  Eyes:     Conjunctiva/sclera: Conjunctivae normal.  Cardiovascular:     Rate and Rhythm: Normal rate and regular rhythm.     Heart sounds: Normal heart sounds. No murmur.  Pulmonary:     Effort: Pulmonary effort is normal. No respiratory distress.     Breath sounds: Normal breath sounds. No wheezing.  Musculoskeletal: Normal range of motion.        General: Swelling (2+ pitting edema bilateral lower extremities, unchanged from previous) present. No tenderness.  Skin:    General:  Skin is warm and dry.     Findings: No rash.  Neurological:     Mental Status: She is alert and oriented to person, place, and time.     Coordination: Coordination normal.  Psychiatric:        Behavior: Behavior normal.        Thought Content: Thought content does not include suicidal ideation. Thought content does not include suicidal plan.         Assessment & Plan:   Problem List Items Addressed This Visit      Cardiovascular and Mediastinum   Atrial fibrillation (HCC)   Relevant Medications   flecainide (TAMBOCOR) 50 MG tablet   Other Relevant Orders   CBC with Differential/Platelet   CMP14+EGFR   TSH     Other   Low serum vitamin D   Relevant Orders   VITAMIN D 25 Hydroxy (Vit-D Deficiency, Fractures)   DG [facility] DEXA   Overweight (BMI 25.0-29.9)   Insomnia - Primary   Relevant Medications   zolpidem (AMBIEN) 5 MG tablet   Other Relevant Orders   CMP14+EGFR   TSH    Other Visit Diagnoses    Osteoporosis screening       Relevant Orders   DG [facility] DEXA       Follow up plan: Return in about 6 months (around 12/18/2018), or if symptoms worsen or fail to improve, for Recheck insomnia.  Counseling provided for all of the vaccine components Orders Placed This Encounter  Procedures  . DG [facility] DEXA  . CBC with Differential/Platelet  . CMP14+EGFR  . VITAMIN D 25 Hydroxy (Vit-D Deficiency, Fractures)  . TSH    Caryl Pina, MD [facility] 06/19/2018, 9:47 AM

## 2018-06-20 LAB — CMP14+EGFR
ALT: 7 IU/L (ref 0–32)
AST: 11 IU/L (ref 0–40)
Albumin/Globulin Ratio: 1.9 (ref 1.2–2.2)
Albumin: 4.3 g/dL (ref 3.6–4.6)
Alkaline Phosphatase: 95 IU/L (ref 39–117)
BUN/Creatinine Ratio: 20 (ref 12–28)
BUN: 18 mg/dL (ref 8–27)
Bilirubin Total: 0.4 mg/dL (ref 0.0–1.2)
CO2: 24 mmol/L (ref 20–29)
Calcium: 9.6 mg/dL (ref 8.7–10.3)
Chloride: 98 mmol/L (ref 96–106)
Creatinine, Ser: 0.9 mg/dL (ref 0.57–1.00)
GFR calc Af Amer: 68 mL/min/{1.73_m2} (ref >59)
GFR, EST NON AFRICAN AMERICAN: 59 mL/min/{1.73_m2} — AB (ref >59)
GLOBULIN, TOTAL: 2.3 g/dL (ref 1.5–4.5)
Glucose: 88 mg/dL (ref 65–99)
Potassium: 4.4 mmol/L (ref 3.5–5.2)
Sodium: 138 mmol/L (ref 134–144)
Total Protein: 6.6 g/dL (ref 6.0–8.5)

## 2018-06-20 LAB — CBC WITH DIFFERENTIAL/PLATELET
Basophils Absolute: 0.1 10*3/uL (ref 0.0–0.2)
Basos: 1 %
EOS (ABSOLUTE): 0.1 10*3/uL (ref 0.0–0.4)
Eos: 1 %
HEMATOCRIT: 43.7 % (ref 34.0–46.6)
Hemoglobin: 14.6 g/dL (ref 11.1–15.9)
Immature Grans (Abs): 0 10*3/uL (ref 0.0–0.1)
Immature Granulocytes: 0 %
Lymphocytes Absolute: 1.3 10*3/uL (ref 0.7–3.1)
Lymphs: 17 %
MCH: 29.6 pg (ref 26.6–33.0)
MCHC: 33.4 g/dL (ref 31.5–35.7)
MCV: 89 fL (ref 79–97)
MONOS ABS: 0.6 10*3/uL (ref 0.1–0.9)
Monocytes: 8 %
Neutrophils Absolute: 5.3 10*3/uL (ref 1.4–7.0)
Neutrophils: 73 %
Platelets: 323 10*3/uL (ref 150–450)
RBC: 4.93 x10E6/uL (ref 3.77–5.28)
RDW: 12.5 % (ref 11.7–15.4)
WBC: 7.3 10*3/uL (ref 3.4–10.8)

## 2018-06-20 LAB — VITAMIN D 25 HYDROXY (VIT D DEFICIENCY, FRACTURES): Vit D, 25-Hydroxy: 16.6 ng/mL — ABNORMAL LOW (ref 30.0–100.0)

## 2018-06-20 LAB — TSH: TSH: 2.6 u[IU]/mL (ref 0.450–4.500)

## 2018-07-31 ENCOUNTER — Telehealth: Payer: Self-pay | Admitting: Cardiology

## 2018-07-31 NOTE — Telephone Encounter (Signed)
° ° °  Virtual Visit Pre-Appointment Phone Call  Steps For Call:  1. Confirm consent - "In the setting of the current Covid19 crisis, you are scheduled for a (phone or video) visit with your provider on (date) at (time).  Just as we do with many in-office visits, in order for you to participate in this visit, we must obtain consent.  If you'd like, I can send this to your mychart (if signed up) or email for you to review.  Otherwise, I can obtain your verbal consent now.  All virtual visits are billed to your insurance company just like a normal visit would be.  By agreeing to a virtual visit, we'd like you to understand that the technology does not allow for your provider to perform an examination, and thus may limit your provider's ability to fully assess your condition.  Finally, though the technology is pretty good, we cannot assure that it will always work on either your or our end, and in the setting of a video visit, we may have to convert it to a phone-only visit.  In either situation, we cannot ensure that we have a secure connection.  Are you willing to proceed?"  2. Give patient instructions for WebEx download to smartphone as below if video visit  3. Advise patient to be prepared with any vital sign or heart rhythm information, their current medicines, and a piece of paper and pen handy for any instructions they may receive the day of their visit  4. Inform patient they will receive a phone call 15 minutes prior to their appointment time (may be from unknown caller ID) so they should be prepared to answer  5. Confirm that appointment type is correct in Epic appointment notes (video vs telephone)    TELEPHONE CALL NOTE  Rhonda [name] has been deemed a candidate for a follow-up tele-health visit to limit community exposure during the Covid-19 pandemic. I spoke with the patient via phone to ensure availability of phone/video source, confirm preferred email & phone number, and discuss  instructions and expectations.  I reminded Rhonda [name] to be prepared with any vital sign and/or heart rhythm information that could potentially be obtained via home monitoring, at the time of her visit. I reminded Rhonda [name] to expect a phone call at the time of her visit if her visit.  Did the patient verbally acknowledge consent to treatment? Yes  [name] 07/31/2018 12:09 PM

## 2018-08-05 NOTE — Progress Notes (Signed)
Virtual Visit via Telephone Note   This visit type was conducted due to national recommendations for restrictions regarding the COVID-19 Pandemic (e.g. social distancing) in an effort to limit this patient's exposure and mitigate transmission in our community.  Due to her co-morbid illnesses, this patient is at least at moderate risk for complications without adequate follow up.  This format is felt to be most appropriate for this patient at this time.  The patient did not have access to video technology/had technical difficulties with video requiring transitioning to audio format only (telephone).  All issues noted in this document were discussed and addressed.  No physical exam could be performed with this format.  Please refer to the patient's chart for her  consent to telehealth for [facility].   Evaluation Performed:  Follow-up visit  Date:  08/06/2018   ID:  Rhonda [name], DOB Nov 07, 1932, MRN [id number]  Patient Location: Home Provider Location: Home  PCP:  Dettinger, Fransisca Kaufmann, MD  Cardiologist:  [name], MD  Electrophysiologist:  None   Chief Complaint:  Atrial fib  History of Present Illness:    Rhonda [name] is a 85 y.o. female with atrial fib.  She was in the hospital in Jan with this.  She was treated with flecainide, Eliquis and Diltiazem.   In Feb she had a POET (Plain Old Exercise Treadmill).    Since I last saw her she has done well.  The patient denies any new symptoms such as chest discomfort, neck or arm discomfort. There has been no new shortness of breath, PND or orthopnea. There have been no reported palpitations, presyncope or syncope.    The patient does not have symptoms concerning for COVID-19 infection (fever, chills, cough, or new shortness of breath).    Past Medical History:  Diagnosis Date  . Arthritis   . Atrial fibrillation ([place]) 05/2015  . Osteopenia   . Syncope 04/2014   Past Surgical History:  Procedure Laterality Date  . BREAST  EXCISIONAL BIOPSY Left   . BREAST EXCISIONAL BIOPSY Right   . COLONOSCOPY  2011  . KNEE ARTHROCENTESIS Bilateral   . POLYPECTOMY  11-01-2009  . ROTATOR CUFF REPAIR    . SHOULDER SURGERY Left   . TOTAL KNEE ARTHROPLASTY Left   . VAGINAL HYSTERECTOMY     twice - vaginal and then oopherectomy     Current Meds  Medication Sig  . acetaminophen (TYLENOL) 650 MG CR tablet Take 650 mg by mouth every 8 (eight) hours as needed for pain.  . cholecalciferol (VITAMIN D3) 25 MCG (1000 UT) tablet Take 2,000 Units by mouth 2 (two) times daily.  Marland Kitchen diltiazem (CARDIZEM CD) 240 MG 24 hr capsule Take 1 capsule (240 mg total) by mouth daily.  Marland Kitchen ELIQUIS 5 MG TABS tablet TAKE ONE TABLET BY MOUTH TWICE DAILY  . flecainide (TAMBOCOR) 50 MG tablet Take 1 tablet (50 mg total) by mouth 2 (two) times daily.  Marland Kitchen zolpidem (AMBIEN) 5 MG tablet Take 1 tablet (5 mg total) by mouth at bedtime as needed for sleep.     Allergies:   Patient has no known allergies.   Social History   Tobacco Use  . Smoking status: Never Smoker  . Smokeless tobacco: Never Used  Substance Use Topics  . Alcohol use: No    Alcohol/week: 0.0 standard drinks  . Drug use: No     Family Hx: The patient's family history includes Arthritis in her father and sister; Bladder Cancer in  her brother; Cancer in her brother; Diabetes in her brother; Heart failure in her mother; Hypertension in her brother, father, and sister.  ROS:   Please see the history of present illness.   k Knee pain Otherwise as stated in the HPI and negative for all other systems.  Prior CV studies:   The following studies were reviewed today:  Recent hospital visit.  Labs/Other Tests and Data Reviewed:    EKG:  No ECG reviewed.  Recent Labs: 06/19/2018: ALT 7; BUN 18; Creatinine, Ser 0.90; Hemoglobin 14.6; Platelets 323; Potassium 4.4; Sodium 138; TSH 2.600   Recent Lipid Panel Lab Results  Component Value Date/Time   CHOL 163 11/18/2017 09:32 AM   TRIG 145  11/18/2017 09:32 AM   HDL 45 11/18/2017 09:32 AM   CHOLHDL 3.6 11/18/2017 09:32 AM   LDLCALC 89 11/18/2017 09:32 AM    Wt Readings from Last 3 Encounters:  08/06/18 171 lb (77.6 kg)  06/19/18 171 lb 12.8 oz (77.9 kg)  06/03/18 171 lb 6.4 oz (77.7 kg)     Objective:    Vital Signs:  BP 133/71 (BP Location: Left Arm, Patient Position: Sitting, Cuff Size: Normal)   Pulse 81   Ht 5\' 5"  (1.651 m)   Wt 171 lb (77.6 kg)   BMI 28.46 kg/m     ASSESSMENT & PLAN:    Persistent atrial fibrillation:   The patient has had no symptomatic recurrence of atrial fibrillation.  She has what to do if she had recurrent event.  Told her she could take an extra 50 mg of flecainide will most likely would need to present to the emergency room.  She is tolerating her coagulation and will continue with the dose as listed.   Hypertension:   Her blood pressures been well controlled.  No change in therapy.  COVID-19 Education: The signs and symptoms of COVID-19 were discussed with the patient and how to seek care for testing (follow up with PCP or arrange E-visit).    Time:   Today, I have spent 16 minutes with the patient with telehealth technology discussing the above problems.     Medication Adjustments/Labs and Tests Ordered: Current medicines are reviewed at length with the patient today.  Concerns regarding medicines are outlined above.   Tests Ordered: No orders of the defined types were placed in this encounter.   Medication Changes: No orders of the defined types were placed in this encounter.   Disposition:  Follow up in 6 month(s)  Signed, [name], MD  08/06/2018 3:21 PM    [facility]

## 2018-08-06 ENCOUNTER — Telehealth (INDEPENDENT_AMBULATORY_CARE_PROVIDER_SITE_OTHER): Payer: PPO | Admitting: Cardiology

## 2018-08-06 ENCOUNTER — Encounter: Payer: Self-pay | Admitting: Cardiology

## 2018-08-06 VITALS — BP 133/71 | HR 81 | Ht 65.0 in | Wt 171.0 lb

## 2018-08-06 DIAGNOSIS — I4891 Unspecified atrial fibrillation: Secondary | ICD-10-CM

## 2018-08-06 DIAGNOSIS — Z7189 Other specified counseling: Secondary | ICD-10-CM

## 2018-08-06 DIAGNOSIS — I10 Essential (primary) hypertension: Secondary | ICD-10-CM | POA: Insufficient documentation

## 2018-08-06 NOTE — Patient Instructions (Signed)
Medication Instructions:  The current medical regimen is effective;  continue present plan and medications.  If you need a refill on your cardiac medications before your next appointment, please call your pharmacy.   Follow-Up: Follow up in 4 month with Dr. Percival Spanish in [place].  You will receive a letter in the mail 2 months before you are due.  Please call us when you receive this letter to schedule your follow up appointment.  Thank you for choosing [facility]!!

## 2018-08-07 ENCOUNTER — Ambulatory Visit (INDEPENDENT_AMBULATORY_CARE_PROVIDER_SITE_OTHER): Payer: PPO | Admitting: Family Medicine

## 2018-08-07 ENCOUNTER — Other Ambulatory Visit: Payer: Self-pay

## 2018-08-07 ENCOUNTER — Encounter: Payer: Self-pay | Admitting: Family Medicine

## 2018-08-07 DIAGNOSIS — M1711 Unilateral primary osteoarthritis, right knee: Secondary | ICD-10-CM | POA: Diagnosis not present

## 2018-08-07 MED ORDER — PREDNISONE 10 MG PO TABS: ORAL_TABLET | ORAL | 0 refills | Status: DC

## 2018-08-07 NOTE — Progress Notes (Signed)
No chief complaint on file.   HPI  Patient presents today for right knee pain. Full of arthritis. So painful it is interfering with ambulation. Has arthritis in back and other places. Left knee has been replaced. Had previous arthroscopy on right. Getting cortisone injection every three months.Most recent was Jan 23 by Dr. Gladstone Lighter. Pt. Concerned that he will not be available for planned injection next week.   PMH: Smoking status noted ROS: Per HPI  Objective: There were no vitals taken for this visit. PHone visit due to COVID 19  Assessment and plan:  No diagnosis found.  Meds ordered this encounter  Medications  . predniSONE (DELTASONE) 10 MG tablet    Sig: Take 5 daily for 3 days followed by 4,3,2 and 1 for 3 days each.    Dispense:  45 tablet    Refill:  0    Patient was encouraged to contact Dr. Charlestine Night office with regard to alternative plans based on the coronavirus social distancing.  In the meantime she will take the prednisone as directed above. We will do so discussed the possibility of Dr. Warrick Parisian doing cortisone injection of the knee in a few days if the prednisone does not help and Dr. Gladstone Lighter turns out to be unavailable  Virtual Visit via telephone Note  I discussed the limitations, risks, security and privacy concerns of performing an evaluation and management service by telephone and the availability of in person appointments. I also discussed with the patient that there may be a patient responsible charge related to this service. The patient expressed understanding and agreed to proceed. Pt. Is at home. Dr. Livia Snellen is in his office.  Follow Up Instructions:   I discussed the assessment and treatment plan with the patient. The patient was provided an opportunity to ask questions and all were answered. The patient agreed with the plan and demonstrated an understanding of the instructions.   The patient was advised to call back or seek an in-person evaluation if  the symptoms worsen or if the condition fails to improve as anticipated.  Visit started: 8:23 Call ended:  8:40 Total minutes including chart review and phone contact time: 17:00 Follow up as needed.  Claretta Fraise, MD

## 2018-09-03 ENCOUNTER — Other Ambulatory Visit: Payer: Self-pay | Admitting: Family Medicine

## 2018-09-03 DIAGNOSIS — Z1231 Encounter for screening mammogram for malignant neoplasm of breast: Secondary | ICD-10-CM

## 2018-09-09 DIAGNOSIS — H259 Unspecified age-related cataract: Secondary | ICD-10-CM | POA: Diagnosis not present

## 2018-09-09 DIAGNOSIS — H04123 Dry eye syndrome of bilateral lacrimal glands: Secondary | ICD-10-CM | POA: Diagnosis not present

## 2018-09-09 DIAGNOSIS — H43819 Vitreous degeneration, unspecified eye: Secondary | ICD-10-CM | POA: Diagnosis not present

## 2018-09-09 DIAGNOSIS — H43393 Other vitreous opacities, bilateral: Secondary | ICD-10-CM | POA: Diagnosis not present

## 2018-09-16 ENCOUNTER — Encounter: Payer: Self-pay | Admitting: Family Medicine

## 2018-09-16 ENCOUNTER — Other Ambulatory Visit: Payer: Self-pay

## 2018-09-16 ENCOUNTER — Ambulatory Visit (INDEPENDENT_AMBULATORY_CARE_PROVIDER_SITE_OTHER): Payer: PPO | Admitting: Family Medicine

## 2018-09-16 VITALS — BP 145/83 | HR 73 | Temp 98.3°F | Ht 65.0 in | Wt 174.6 lb

## 2018-09-16 DIAGNOSIS — M1711 Unilateral primary osteoarthritis, right knee: Secondary | ICD-10-CM | POA: Diagnosis not present

## 2018-09-16 MED ORDER — METHYLPREDNISOLONE ACETATE 80 MG/ML IJ SUSP
80.0000 mg | Freq: Once | INTRAMUSCULAR | Status: AC
  Administered 2018-09-16: 80 mg via INTRAMUSCULAR

## 2018-09-16 NOTE — Progress Notes (Signed)
BP (!) 145/83   Pulse 73   Temp 98.3 F (36.8 C) (Oral)   Ht 5\' 5"  (1.651 m)   Wt 174 lb 9.6 oz (79.2 kg)   BMI 29.05 kg/m    Subjective:   Patient ID: Rhonda [name], female    DOB: October 14, 1932, 85 y.o.   MRN: [id number]  HPI: Rhonda [name] is a 85 y.o. female presenting on 09/16/2018 for Knee Pain (right- Patient is requesting injection. x 5 weeks)   HPI Patient has been having problems with her right knee for quite some time with the arthritis but it is worsened over the past 5 weeks.  She has had both cartilage repair and meniscal repair on that knee previously and arthroscopic surgery another time for cleaning it out, she has had injections previously and that helped her a lot, this been 4 months since her injection and she would like to see if she can get one again.  Patient does says it is been building up and she did do short course of prednisone which helped a lot but knows that she cannot do it long-term because of the side effects.  Relevant past medical, surgical, family and social history reviewed and updated as indicated. Interim medical history since our last visit reviewed. Allergies and medications reviewed and updated.  Review of Systems  Constitutional: Negative for chills and fever.  Respiratory: Negative for chest tightness and shortness of breath.   Cardiovascular: Positive for leg swelling. Negative for chest pain.  Musculoskeletal: Positive for arthralgias and joint swelling. Negative for back pain and gait problem.  Skin: Negative for color change and rash.  All other systems reviewed and are negative.   Per HPI unless specifically indicated above   Allergies as of 09/16/2018   No Known Allergies     Medication List       Accurate as of Sep 16, 2018 11:43 AM. If you have any questions, ask your nurse or doctor.        STOP taking these medications   predniSONE 10 MG tablet Commonly known as:  DELTASONE Stopped by:  Fransisca Kaufmann Dettinger, MD     TAKE these medications   acetaminophen 650 MG CR tablet Commonly known as:  TYLENOL Take 650 mg by mouth every 8 (eight) hours as needed for pain.   cholecalciferol 25 MCG (1000 UT) tablet Commonly known as:  VITAMIN D3 Take 2,000 Units by mouth 2 (two) times daily.   diltiazem 240 MG 24 hr capsule Commonly known as:  CARDIZEM CD Take 1 capsule (240 mg total) by mouth daily.   Eliquis 5 MG Tabs tablet Generic drug:  apixaban TAKE ONE TABLET BY MOUTH TWICE DAILY   flecainide 50 MG tablet Commonly known as:  TAMBOCOR Take 1 tablet (50 mg total) by mouth 2 (two) times daily.   zolpidem 5 MG tablet Commonly known as:  AMBIEN Take 1 tablet (5 mg total) by mouth at bedtime as needed for sleep.        Objective:   BP (!) 145/83   Pulse 73   Temp 98.3 F (36.8 C) (Oral)   Ht 5\' 5"  (1.651 m)   Wt 174 lb 9.6 oz (79.2 kg)   BMI 29.05 kg/m   Wt Readings from Last 3 Encounters:  09/16/18 174 lb 9.6 oz (79.2 kg)  08/06/18 171 lb (77.6 kg)  06/19/18 171 lb 12.8 oz (77.9 kg)    Physical Exam Vitals signs and nursing note reviewed.  Constitutional:      General: She is not in acute distress.    Appearance: She is well-developed. She is not diaphoretic.  Eyes:     Conjunctiva/sclera: Conjunctivae normal.  Musculoskeletal: Normal range of motion.     Right knee: She exhibits effusion. She exhibits normal range of motion. Tenderness found. Medial joint line tenderness noted.  Skin:    General: Skin is warm and dry.     Findings: No rash.  Neurological:     Mental Status: She is alert and oriented to person, place, and time.     Coordination: Coordination normal.  Psychiatric:        Behavior: Behavior normal.     Knee injection: Consent form signed. Risk factors of bleeding and infection discussed with patient and patient is agreeable towards injection. Patient prepped with Betadine. Lateral approach towards injection used. Injected 80 mg of Depo-Medrol and 1 mL of 2%  lidocaine. Patient tolerated procedure well and no side effects from noted. Minimal to no bleeding. Simple bandage applied after.   Assessment & Plan:   Problem List Items Addressed This Visit    None    Visit Diagnoses    Arthritis of knee, right    -  Primary   Relevant Medications   methylPREDNISolone acetate (DEPO-MEDROL) injection 80 mg (Start on 09/16/2018 11:45 AM)       Follow up plan: Return if symptoms worsen or fail to improve.  Counseling provided for all of the vaccine components No orders of the defined types were placed in this encounter.   Caryl Pina, MD [facility] 09/16/2018, 11:43 AM

## 2018-09-30 DIAGNOSIS — L570 Actinic keratosis: Secondary | ICD-10-CM | POA: Diagnosis not present

## 2018-09-30 DIAGNOSIS — D239 Other benign neoplasm of skin, unspecified: Secondary | ICD-10-CM | POA: Diagnosis not present

## 2018-09-30 DIAGNOSIS — L821 Other seborrheic keratosis: Secondary | ICD-10-CM | POA: Diagnosis not present

## 2018-09-30 DIAGNOSIS — L819 Disorder of pigmentation, unspecified: Secondary | ICD-10-CM | POA: Diagnosis not present

## 2018-09-30 DIAGNOSIS — D2262 Melanocytic nevi of left upper limb, including shoulder: Secondary | ICD-10-CM | POA: Diagnosis not present

## 2018-09-30 DIAGNOSIS — D485 Neoplasm of uncertain behavior of skin: Secondary | ICD-10-CM | POA: Diagnosis not present

## 2018-10-18 ENCOUNTER — Emergency Department (HOSPITAL_COMMUNITY): Payer: PPO

## 2018-10-18 ENCOUNTER — Other Ambulatory Visit: Payer: Self-pay

## 2018-10-18 ENCOUNTER — Emergency Department (HOSPITAL_COMMUNITY)
Admission: EM | Admit: 2018-10-18 | Discharge: 2018-10-18 | Disposition: A | Discharge status: Home / Self Care | Payer: PPO | Attending: Emergency Medicine | Admitting: Emergency Medicine

## 2018-10-18 ENCOUNTER — Encounter (HOSPITAL_COMMUNITY): Payer: Self-pay | Admitting: Emergency Medicine

## 2018-10-18 DIAGNOSIS — Z96652 Presence of left artificial knee joint: Secondary | ICD-10-CM | POA: Insufficient documentation

## 2018-10-18 DIAGNOSIS — R002 Palpitations: Secondary | ICD-10-CM | POA: Diagnosis not present

## 2018-10-18 DIAGNOSIS — R0989 Other specified symptoms and signs involving the circulatory and respiratory systems: Secondary | ICD-10-CM | POA: Diagnosis not present

## 2018-10-18 DIAGNOSIS — I959 Hypotension, unspecified: Secondary | ICD-10-CM | POA: Diagnosis not present

## 2018-10-18 DIAGNOSIS — Z7901 Long term (current) use of anticoagulants: Secondary | ICD-10-CM | POA: Insufficient documentation

## 2018-10-18 DIAGNOSIS — I471 Supraventricular tachycardia, unspecified: Secondary | ICD-10-CM

## 2018-10-18 DIAGNOSIS — J9811 Atelectasis: Secondary | ICD-10-CM | POA: Diagnosis not present

## 2018-10-18 DIAGNOSIS — R0902 Hypoxemia: Secondary | ICD-10-CM | POA: Diagnosis not present

## 2018-10-18 DIAGNOSIS — R000 Tachycardia, unspecified: Secondary | ICD-10-CM | POA: Diagnosis not present

## 2018-10-18 LAB — BASIC METABOLIC PANEL
Anion gap: 8 (ref 5–15)
BUN: 22 mg/dL (ref 8–23)
CO2: 25 mmol/L (ref 22–32)
Calcium: 8.5 mg/dL — ABNORMAL LOW (ref 8.9–10.3)
Chloride: 105 mmol/L (ref 98–111)
Creatinine, Ser: 0.89 mg/dL (ref 0.44–1.00)
GFR calc Af Amer: >60 mL/min (ref >60)
GFR calc non Af Amer: 60 mL/min — ABNORMAL LOW (ref >60)
Glucose, Bld: 94 mg/dL (ref 70–99)
Potassium: 4.6 mmol/L (ref 3.5–5.1)
Sodium: 138 mmol/L (ref 135–145)

## 2018-10-18 LAB — CBC WITH DIFFERENTIAL/PLATELET
Abs Immature Granulocytes: 0.03 10*3/uL (ref 0.00–0.07)
Basophils Absolute: 0.1 10*3/uL (ref 0.0–0.1)
Basophils Relative: 1 %
Eosinophils Absolute: 0.1 10*3/uL (ref 0.0–0.5)
Eosinophils Relative: 1 %
HCT: 44.7 % (ref 36.0–46.0)
Hemoglobin: 14.2 g/dL (ref 12.0–15.0)
Immature Granulocytes: 0 %
Lymphocytes Relative: 13 %
Lymphs Abs: 1.1 10*3/uL (ref 0.7–4.0)
MCH: 30.3 pg (ref 26.0–34.0)
MCHC: 31.8 g/dL (ref 30.0–36.0)
MCV: 95.5 fL (ref 80.0–100.0)
Monocytes Absolute: 0.6 10*3/uL (ref 0.1–1.0)
Monocytes Relative: 7 %
Neutro Abs: 6.5 10*3/uL (ref 1.7–7.7)
Neutrophils Relative %: 78 %
Platelets: 242 10*3/uL (ref 150–400)
RBC: 4.68 MIL/uL (ref 3.87–5.11)
RDW: 13.2 % (ref 11.5–15.5)
WBC: 8.3 10*3/uL (ref 4.0–10.5)
nRBC: 0 % (ref 0.0–0.2)

## 2018-10-18 LAB — PROTIME-INR
INR: 1 (ref 0.8–1.2)
Prothrombin Time: 12.9 seconds (ref 11.4–15.2)

## 2018-10-18 MED ORDER — ADENOSINE 6 MG/2ML IV SOLN
INTRAVENOUS | Status: AC
  Filled 2018-10-18: qty 2

## 2018-10-18 MED ORDER — ADENOSINE 6 MG/2ML IV SOLN
INTRAVENOUS | Status: AC
  Filled 2018-10-18: qty 4

## 2018-10-18 MED ORDER — ADENOSINE 6 MG/2ML IV SOLN
INTRAVENOUS | Status: AC
  Administered 2018-10-18: 12 mg
  Filled 2018-10-18: qty 2

## 2018-10-18 NOTE — ED Provider Notes (Signed)
[facility] EMERGENCY DEPARTMENT Provider Note   CSN: [id number] Arrival date & time: 10/18/18  1232    History   Chief Complaint Chief Complaint  Patient presents with  . Tachycardia    HPI Rhonda [name] is a 85 y.o. female.     HPI  The patient is an 85 year old female with a history of atrial fibrillation, she has been seen by cardiology in the past and in fact she was admitted to the hospital in January with rapid atrial fibrillation, converted spontaneously, is currently taking flecainide as well as her Eliquis which she has been on for more than 6 months.  This morning while she was in her house she developed acute onset of palpitations, found to be tachycardic with a pulse around 200, vagal maneuvers were done and her pulse did not seem to change much, on arrival it is 190.  No chest pain or shortness of breath and at this time she does not feel weak or feel palpitations.  No fevers chills nausea vomiting diarrhea, no swelling of the legs, no other complaints, normal food intake, normal caffeine intake.  Review of the medical record shows that the patient has been cardioverted in the past  Additional history obtained from paramedics who report that the patient has had no instability in route to the hospital.  Past Medical History:  Diagnosis Date  . Arthritis   . Atrial fibrillation ([place]) 05/2015  . Osteopenia   . Syncope 04/2014    Patient Active Problem List   Diagnosis Date Noted  . Essential hypertension 08/06/2018  . Insomnia 06/19/2018  . Overweight (BMI 25.0-29.9) 09/19/2017  . Low serum vitamin D 05/24/2016  . Osteopenia 05/23/2016  . Atrial fibrillation ([place]) 07/13/2015  . Syncope 05/05/2014    Past Surgical History:  Procedure Laterality Date  . BREAST EXCISIONAL BIOPSY Left   . BREAST EXCISIONAL BIOPSY Right   . COLONOSCOPY  2011  . KNEE ARTHROCENTESIS Bilateral   . POLYPECTOMY  11-01-2009  . ROTATOR CUFF REPAIR    . SHOULDER SURGERY Left   .  TOTAL KNEE ARTHROPLASTY Left   . VAGINAL HYSTERECTOMY     twice - vaginal and then oopherectomy     OB History   No obstetric history on file.      Home Medications    Prior to Admission medications   Medication Sig Start Date End Date Taking? Authorizing Provider  acetaminophen (TYLENOL) 650 MG CR tablet Take 1,300 mg by mouth every 8 (eight) hours as needed for pain.    Yes [provider]  cholecalciferol (VITAMIN D3) 25 MCG (1000 UT) tablet Take 2,000 Units by mouth 2 (two) times daily.   Yes [provider]  diltiazem (CARDIZEM CD) 240 MG 24 hr capsule Take 1 capsule (240 mg total) by mouth daily. 06/03/18 10/18/18 Yes Barrett, Rhonda G, PA-C  ELIQUIS 5 MG TABS tablet TAKE ONE TABLET BY MOUTH TWICE DAILY 06/09/18  Yes [name], MD  flecainide (TAMBOCOR) 50 MG tablet Take 1 tablet (50 mg total) by mouth 2 (two) times daily. 06/19/18  Yes Dettinger, Fransisca Kaufmann, MD  zolpidem (AMBIEN) 5 MG tablet Take 1 tablet (5 mg total) by mouth at bedtime as needed for sleep. 06/19/18  Yes Dettinger, Fransisca Kaufmann, MD    Family History Family History  Problem Relation Age of Onset  . Heart failure Mother   . Hypertension Father   . Arthritis Father   . Hypertension Sister   . Arthritis Sister   .  Cancer Brother   . Bladder Cancer Brother   . Hypertension Brother   . Diabetes Brother     Social History Social History   Tobacco Use  . Smoking status: Never Smoker  . Smokeless tobacco: Never Used  Substance Use Topics  . Alcohol use: No    Alcohol/week: 0.0 standard drinks  . Drug use: No     Allergies   Patient has no known allergies.   Review of Systems Review of Systems  All other systems reviewed and are negative.    Physical Exam Updated Vital Signs BP 132/66   Pulse 100   Temp 97.8 F (36.6 C) (Oral)   Resp 18   Ht 1.651 m (5\' 5" )   Wt 77.1 kg   SpO2 96%   BMI 28.29 kg/m   Physical Exam Vitals signs and nursing note reviewed.   Constitutional:      General: She is not in acute distress.    Appearance: She is well-developed.  HENT:     Head: Normocephalic and atraumatic.     Mouth/Throat:     Pharynx: No oropharyngeal exudate.  Eyes:     General: No scleral icterus.       Right eye: No discharge.        Left eye: No discharge.     Conjunctiva/sclera: Conjunctivae normal.     Pupils: Pupils are equal, round, and reactive to light.  Neck:     Musculoskeletal: Normal range of motion and neck supple.     Thyroid: No thyromegaly.     Vascular: No JVD.  Cardiovascular:     Rate and Rhythm: Regular rhythm. Tachycardia present.     Heart sounds: Normal heart sounds. No murmur. No friction rub. No gallop.      Comments: Pulse of around 190, very regular, strong pulses at the radial artery, no murmurs Pulmonary:     Effort: Pulmonary effort is normal. No respiratory distress.     Breath sounds: Normal breath sounds. No wheezing or rales.  Abdominal:     General: Bowel sounds are normal. There is no distension.     Palpations: Abdomen is soft. There is no mass.     Tenderness: There is no abdominal tenderness.  Musculoskeletal: Normal range of motion.        General: No tenderness.  Lymphadenopathy:     Cervical: No cervical adenopathy.  Skin:    General: Skin is warm and dry.     Findings: No erythema or rash.  Neurological:     Mental Status: She is alert.     Coordination: Coordination normal.  Psychiatric:        Behavior: Behavior normal.      ED Treatments / Results  Labs (all labs ordered are listed, but only abnormal results are displayed) Labs Reviewed  BASIC METABOLIC PANEL - Abnormal; Notable for the following components:      Result Value   Calcium 8.5 (*)    GFR calc non Af Amer 60 (*)    All other components within normal limits  CBC WITH DIFFERENTIAL/PLATELET  PROTIME-INR    EKG EKG Interpretation  Date/Time:  Saturday October 18 2018 13:22:54 EDT Ventricular Rate:  87 PR  Interval:    QRS Duration: 91 QT Interval:  389 QTC Calculation: 468 R Axis:   -7 Text Interpretation:  Sinus rhythm Normal ECG Confirmed by Noemi Chapel [phone]) on 10/18/2018 1:29:37 PM   EKG Interpretation  Date/Time:  Saturday October 18 2018 13:22:54  EDT Ventricular Rate:  87 PR Interval:    QRS Duration: 91 QT Interval:  389 QTC Calculation: 468 R Axis:   -7 Text Interpretation:  Sinus rhythm Normal ECG Confirmed by Noemi Chapel [phone]) on 10/18/2018 1:29:37 PM        Radiology Dg Chest Port 1 View  Result Date: 10/18/2018 CLINICAL DATA:  Palpitations, tachycardia, history atrial fibrillation EXAM: PORTABLE CHEST 1 VIEW COMPARISON:  Portable exam 1415 hours compared to 05/19/2018 FINDINGS: External pacing lead projects over chest. Upper normal heart size. Mediastinal contours and pulmonary vascularity normal. Chronic elevation of RIGHT diaphragm with minimal bibasilar atelectasis. No acute infiltrate, pleural effusion or pneumothorax. Bones demineralized. IMPRESSION: Minimal bibasilar atelectasis. Electronically Signed   By: Lavonia Dana M.D.   On: 10/18/2018 14:53    Procedures .Critical Care Performed by: Noemi Chapel, MD Authorized by: Noemi Chapel, MD   Critical care provider statement:    Critical care time (minutes):  35   Critical care time was exclusive of:  Separately billable procedures and treating other patients and teaching time   Critical care was necessary to treat or prevent imminent or life-threatening deterioration of the following conditions:  Cardiac failure   Critical care was time spent personally by me on the following activities:  Blood draw for specimens, development of treatment plan with patient or surrogate, discussions with consultants, evaluation of patient's response to treatment, examination of patient, obtaining history from patient or surrogate, ordering and performing treatments and interventions, ordering and review of laboratory studies,  ordering and review of radiographic studies, pulse oximetry, re-evaluation of patient's condition and review of old charts   (including critical care time)  Medications Ordered in ED Medications  adenosine (ADENOCARD) 6 MG/2ML injection (  Not Given 10/18/18 1248)  adenosine (ADENOCARD) 6 MG/2ML injection (12 mg  Given 10/18/18 1244)     Initial Impression / Assessment and Plan / ED Course  I have reviewed the triage vital signs and the nursing notes.  Pertinent labs & imaging results that were available during my care of the patient were reviewed by me and considered in my medical decision making (see chart for details).  Clinical Course as of Oct 17 1509  Sat Oct 18, 2018  1335 Patient was reevaluated, she remains normotensive, in normal sinus rhythm, repeat EKG is totally normal.   [BM]  1510 Labs are totally unremarkable, patient remains in sinus rhythm, stable for discharge   [BM]    Clinical Course User Index [BM] Noemi Chapel, MD       The patient does not appear ill or toxic however she does have a severe tachycardia which will require intervention.  We will start with adenosine in hopes that this is SVT though she may need cardioversion if this does not work.  Patient is agreeable to the plan.  She does not have any chest pain, she had a stress test in the last 6 months, not known to have significant ischemic disease.  Patient appeared critically ill with severe tachycardia requiring immediate chemical cardioversion.  She was given adenosine 12 mg with successful conversion to a normal sinus rhythm.  At this time the patient appears much more comfortable, her heart rate is down to 90, her blood pressure is normal and her repeat EKG is nonischemic and well-appearing in normal sinus rhythm.  3:00 PM, the patient remains in sinus rhythm, well-appearing and asymptomatic, stable for discharge at this time to follow-up with cardiology as an outpatient.  Final Clinical  Impressions(s) / ED Diagnoses   Final diagnoses:  SVT (supraventricular tachycardia) [facility])    ED Discharge Orders    None       Noemi Chapel, MD 10/18/18 1511

## 2018-10-18 NOTE — ED Notes (Signed)
Successful chemical cardioversion using adenosine 12mg  with Dr. Sabra Heck at bedside. Pt tolerated very well.

## 2018-10-18 NOTE — Discharge Instructions (Signed)
Please read the attached instructions regarding supraventricular tachycardia which is what occurred today causing your heart to race.  That problem has been fixed but it may occur again in the future.  If you feel like your heart is racing again and does not improve with your home medications you should return to the emergency department for a recheck.

## 2018-10-18 NOTE — ED Triage Notes (Signed)
Pt reports having onset of rapid hr at 1030 this morning with palpitations. Asa 324mg  taken pta.

## 2018-10-20 ENCOUNTER — Telehealth: Payer: Self-pay | Admitting: *Deleted

## 2018-10-20 NOTE — Telephone Encounter (Signed)
Patient scheduled for 07/24/18  [name], MD  Alvina Filbert B, LPN        Sounds like she might want at least a telehealth visit with me.     Advised patient best possible care will be given via telephone visit and insurance will be billed Patient gave verbal consent for telephone visit

## 2018-10-22 NOTE — Progress Notes (Signed)
Virtual Visit via Telephone Note   This visit type was conducted due to national recommendations for restrictions regarding the COVID-19 Pandemic (e.g. social distancing) in an effort to limit this patient's exposure and mitigate transmission in our community.  Due to her co-morbid illnesses, this patient is at least at moderate risk for complications without adequate follow up.  This format is felt to be most appropriate for this patient at this time.  The patient did not have access to video technology/had technical difficulties with video requiring transitioning to audio format only (telephone).  All issues noted in this document were discussed and addressed.  No physical exam could be performed with this format.  Please refer to the patient's chart for her  consent to telehealth for [facility].   Evaluation Performed:  Follow-up visit  Date:  10/23/2018   ID:  Rhonda [name], DOB May 05, 1932, MRN [id number]  Patient Location: Home Provider Location: Home  PCP:  Dettinger, Fransisca Kaufmann, MD  Cardiologist:  [name], MD  Electrophysiologist:  None   Chief Complaint:  Atrial fib  History of Present Illness:    Rhonda [name] is a 85 y.o. female with atrial fib.  She was in the hospital in Jan with this.  She was treated with flecainide, Eliquis and Diltiazem.   In Feb she had a POET (Plain [employer] Exercise Treadmill).    Since I last saw her she was in the ED a few days ago with SVT. She was treated with adenosine.  I reviewed these records for this visit.   Of note I went back and she was in fibrillation in January when she initially presented.  However, she was in a regular narrow complex tachycardia when she presented a few days ago.  There did not appear to be flutter waves.  He converted with adenosine and appeared to be a separate arrhythmia with SVT.  She actually said it felt different than previous.  She felt her heart skipping.  She was bending over when it happened.  She felt  presyncopal.  She had some chest discomfort.  This was not like her fibrillation.  It was more uncomfortable and frightening.  She called EMS and was taken to the emergency room.  Since that time she is had no further discomfort.  She denies any chest pressure, neck or arm discomfort.  She has had no weight gain or edema.  She has had no new shortness of breath, PND or orthopnea.  She has had no palpitations, presyncope or syncope otherwise.  She has been more fatigued since this event.  The patient does not have symptoms concerning for COVID-19 infection (fever, chills, cough, or new shortness of breath).    Past Medical History:  Diagnosis Date  . Arthritis   . Atrial fibrillation ([place]) 05/2015  . Osteopenia   . Syncope 04/2014   Past Surgical History:  Procedure Laterality Date  . BREAST EXCISIONAL BIOPSY Left   . BREAST EXCISIONAL BIOPSY Right   . COLONOSCOPY  2011  . KNEE ARTHROCENTESIS Bilateral   . POLYPECTOMY  11-01-2009  . ROTATOR CUFF REPAIR    . SHOULDER SURGERY Left   . TOTAL KNEE ARTHROPLASTY Left   . VAGINAL HYSTERECTOMY     twice - vaginal and then oopherectomy     Current Meds  Medication Sig  . acetaminophen (TYLENOL) 650 MG CR tablet Take 1,300 mg by mouth every 8 (eight) hours as needed for pain.   . cholecalciferol (VITAMIN D3)  25 MCG (1000 UT) tablet Take 2,000 Units by mouth 2 (two) times daily.  Marland Kitchen diltiazem (CARDIZEM CD) 240 MG 24 hr capsule Take 1 capsule (240 mg total) by mouth daily.  Marland Kitchen ELIQUIS 5 MG TABS tablet TAKE ONE TABLET BY MOUTH TWICE DAILY  . flecainide (TAMBOCOR) 50 MG tablet Take 1 tablet (50 mg total) by mouth 2 (two) times daily.  Marland Kitchen zolpidem (AMBIEN) 5 MG tablet Take 1 tablet (5 mg total) by mouth at bedtime as needed for sleep.     Allergies:   Patient has no known allergies.   Social History   Tobacco Use  . Smoking status: Never Smoker  . Smokeless tobacco: Never Used  Substance Use Topics  . Alcohol use: No    Alcohol/week: 0.0  standard drinks  . Drug use: No     Family Hx: The patient's family history includes Arthritis in her father and sister; Bladder Cancer in her brother; Cancer in her brother; Diabetes in her brother; Heart failure in her mother; Hypertension in her brother, father, and sister.  ROS:   Please see the history of present illness.    Otherwise as stated in the HPI and negative for all other systems.  Prior CV studies:   The following studies were reviewed today:  ED records  Labs/Other Tests and Data Reviewed:    EKG:    SVT, rate 190, axis within normal limits, intervals within normal limits, no acute ST-T wave changes.   10/18/18  Recent Labs: 06/19/2018: ALT 7; TSH 2.600 10/18/2018: BUN 22; Creatinine, Ser 0.89; Hemoglobin 14.2; Platelets 242; Potassium 4.6; Sodium 138   Recent Lipid Panel Lab Results  Component Value Date/Time   CHOL 163 11/18/2017 09:32 AM   TRIG 145 11/18/2017 09:32 AM   HDL 45 11/18/2017 09:32 AM   CHOLHDL 3.6 11/18/2017 09:32 AM   LDLCALC 89 11/18/2017 09:32 AM    Wt Readings from Last 3 Encounters:  10/23/18 168 lb (76.2 kg)  10/18/18 170 lb (77.1 kg)  09/16/18 174 lb 9.6 oz (79.2 kg)     Objective:    Vital Signs:  BP 126/63   Pulse 82   Ht 5\' 5"  (1.651 m)   Wt 168 lb (76.2 kg)   BMI 27.96 kg/m    VS reviewed    ASSESSMENT & PLAN:    Persistent atrial fibrillation:   The patient has no documented recurrence of this particular arrhythmia.   I think she had a separate arrhythmia as reported below.  I am going to increase her Cardizem.  I will increase to 360 mg.  She will let me know if she has any lightheadedness.  She knows to present to the emergency room with any recurrent arrhythmias.   For now she will continue the current dose of flecainide.  Hypertension:   Blood pressures been controlled in the context of managing her arrhythmia.   SVT: It appears that she has a separate arrhythmia and if we have documented recurrence of this I might  suggest consideration of ablation.  For now we will try to increase dose of Cardizem.  COVID-19 Education: The signs and symptoms of COVID-19 were discussed with the patient and how to seek care for testing (follow up with PCP or arrange E-visit).    Time:   Today, I have spent 25 minutes with the patient with telehealth technology discussing the above problems.     Medication Adjustments/Labs and Tests Ordered: Current medicines are reviewed at length with the  patient today.  Concerns regarding medicines are outlined above.   Tests Ordered: No orders of the defined types were placed in this encounter.   Medication Changes: No orders of the defined types were placed in this encounter.   Disposition:  Follow up with me in [place] in March   Signed, [name], MD  10/23/2018 4:25 PM    [facility]

## 2018-10-23 ENCOUNTER — Encounter: Payer: Self-pay | Admitting: Cardiology

## 2018-10-23 ENCOUNTER — Telehealth (INDEPENDENT_AMBULATORY_CARE_PROVIDER_SITE_OTHER): Payer: PPO | Admitting: Cardiology

## 2018-10-23 VITALS — BP 126/63 | HR 82 | Ht 65.0 in | Wt 168.0 lb

## 2018-10-23 DIAGNOSIS — I10 Essential (primary) hypertension: Secondary | ICD-10-CM | POA: Diagnosis not present

## 2018-10-23 DIAGNOSIS — I471 Supraventricular tachycardia: Secondary | ICD-10-CM

## 2018-10-23 DIAGNOSIS — I480 Paroxysmal atrial fibrillation: Secondary | ICD-10-CM

## 2018-10-23 MED ORDER — DILTIAZEM HCL ER COATED BEADS 360 MG PO CP24: 360.0000 mg | ORAL_CAPSULE | Freq: Every day | ORAL | 3 refills | Status: DC

## 2018-10-23 NOTE — Patient Instructions (Addendum)
Medication Instructions:  INCREASE YOUR DILTIAZEM TO 360 MG DAILY   If you need a refill on your cardiac medications before your next appointment, please call your pharmacy.   Lab work: NONE  Testing/Procedures: NONE  Follow-Up: KEEP FOLLOW UP AS SCHEDULED

## 2018-10-27 ENCOUNTER — Ambulatory Visit
Admission: RE | Admit: 2018-10-27 | Discharge: 2018-10-27 | Disposition: A | Discharge status: Home / Self Care | Payer: PPO | Source: Ambulatory Visit | Attending: Family Medicine | Admitting: Family Medicine

## 2018-10-27 ENCOUNTER — Other Ambulatory Visit: Payer: Self-pay

## 2018-10-27 ENCOUNTER — Other Ambulatory Visit: Payer: Self-pay | Admitting: Pharmacist

## 2018-10-27 DIAGNOSIS — Z1231 Encounter for screening mammogram for malignant neoplasm of breast: Secondary | ICD-10-CM

## 2018-10-27 NOTE — Patient Outreach (Signed)
[name]) Care Management  [place]   10/27/2018  Kanita Delage [facility] [birth year] [id number]  Reason for referral: Medication Assistance with Eliquis/Medication review  Referral source: [employer] Utilization Management Department Current insurance: [employer]  PMHx includes but not limited to:  Afib, HTN, vitD deficiency  Outreach:  Successful telephone call with Rhonda [name].  HIPAA identifiers verified.  85 year old female with a history of atrial fibrillation (CHADS2vasc=3 on Eliquis) and recent ED admission on 10/18/18 for SVTs.  She is followed by cardiology.  Patient states her HR has ranged from 70-80 over the past few days.  She reports her BP to be 122-146/67-70.  She denies tachycardia or bradycardia.  She denies dizziness and shortness of breath.  She continues on Eliquis, diltiazem and fleccanide for rate control/history of Afib.  She denies signs and symptoms of bleeding while on Eliquis.  Her diltiazem has recently been increased to 361m, which she is tolerating.  She has been on Eliquis for >6 months.  She is monitoring her BP/HR daily at home.  Her next appt with cardiology is [facility]  Patient states her Eliquis is expensive and she is agreeable to participate in patient assistance program with Eliquis through [place]  Will send paperwork to cardiologist and patient to complete.  Medications reviewed and updated in the EMR.  Lipid Panel     Component Value Date/Time   CHOL 163 11/18/2017 0932   TRIG 145 11/18/2017 0932   HDL 45 11/18/2017 0932   CHOLHDL 3.6 11/18/2017 0932   LDLCALC 89 11/18/2017 0932    BP Readings from Last 3 Encounters:  10/23/18 126/63  10/18/18 117/72  09/16/18 (!) 145/83    No Known Allergies  Medications Reviewed Today    Reviewed by PLavera Guise [place](Pharmacist) on 10/27/18 at [place] Status: <None>  Medication Order Taking? Sig Documenting  Provider Last Dose Status Informant  acetaminophen (TYLENOL) 650 MG CR tablet [id number] Take 1,300 mg by mouth every 8 (eight) hours as needed for pain.  [provider] Taking Active Self  cholecalciferol (VITAMIN D3) 25 MCG (1000 UT) tablet [id number] Take 2,000 Units by mouth 2 (two) times daily. [provider] Taking Active Self  diltiazem (CARDIZEM CD) 360 MG 24 hr capsule [id number] Take 1 capsule (360 mg total) by mouth daily. HMinus Breeding MD Taking Active   ELIQUIS 5 MG TABS tablet [id number] TAKE ONE TABLET BY MOUTH TWICE DAILY HMinus Breeding MD Taking Active Self  flecainide (TAMBOCOR) 50 MG tablet [id number] Take 1 tablet (50 mg total) by mouth 2 (two) times daily. Dettinger, JFransisca Kaufmann MD Taking Active Self  zolpidem (AMBIEN) 5 MG tablet [id number] Take 1 tablet (5 mg total) by mouth at bedtime as needed for sleep. Dettinger, JFransisca Kaufmann MD Taking Active Self          Assessment: Drugs sorted by system:  Neurologic/Psychologic: zolpidem PRN  Cardiovascular:  Fleccanide, apixaban, diltiazem  Pain: APAP PRN  Vitamins/Minerals/Supplements: vitD  Medication Assistance Findings:   Patient Assistance Programs: Eliquis made by BM[place]requirement met: Yes o Out-of-pocket prescription expenditure met:   Yes - Patient has met application requirements to apply for this patient assistance program.      Plan: . I will route patient assistance letter to [employer] who will coordinate patient assistance program application process for medications listed above.  [facility] Hospitalpharmacy technician  will assist with obtaining all required documents from both patient and provider(s) and submit application(s) once completed.    Regina Eck, PharmD, [place]  [phone]

## 2018-10-29 ENCOUNTER — Other Ambulatory Visit: Payer: Self-pay | Admitting: Pharmacy Technician

## 2018-10-29 NOTE — Patient Outreach (Signed)
[facility]) Care Management  10/29/2018  Shalay Carder [facility] 1932/12/13 [id number]                          Medication Assistance Referral  Referral From: [facility] RPh Jenne Pane  Medication/Company: Eliquis / [place] Patient application portion:  Mailed Provider application portion: Faxed  to Dr. Percival Spanish   Follow up:  Will follow up with patient in 7-10 business days to confirm application(s) have been received.  Maud Deed Chana Bode [place] Certified Pharmacy Technician [place] Management Direct Dial:[phone]

## 2018-11-10 ENCOUNTER — Ambulatory Visit (INDEPENDENT_AMBULATORY_CARE_PROVIDER_SITE_OTHER): Payer: PPO | Admitting: Family Medicine

## 2018-11-10 ENCOUNTER — Other Ambulatory Visit: Payer: Self-pay

## 2018-11-13 ENCOUNTER — Other Ambulatory Visit: Payer: Self-pay | Admitting: Pharmacy Technician

## 2018-11-13 NOTE — Patient Outreach (Signed)
[facility]) Care Management  11/13/2018  Perl Folmar [facility] 05-Jul-1932 [id number]    Successful call placed to patient regarding patient assistance application(s) for Eliquis , HIPAA identifiers verified. Ms. Fuchs confirms that she received the patient assistance application. She states that she filled it out and placed in the mail today.  Follow up:  Will submit to [employer] once all documents have been received.  Maud Deed Chana Bode [place] Certified Pharmacy Technician [place] Management Direct Dial:[phone]

## 2018-11-17 ENCOUNTER — Other Ambulatory Visit: Payer: Self-pay | Admitting: Pharmacy Technician

## 2018-11-17 NOTE — Patient Outreach (Signed)
[facility]) Care Management  11/17/2018  Myrle Wanek [facility] 09-02-1932 [id number]   Received patient portion(s) of patient assistance application for Eliquis. Faxed completed application and required documents into [employer]MS.  Will follow up with company in 5-7 business days to check status of application.  Maud Deed Chana Bode [place] Certified Pharmacy Technician [place] Management Direct [phone]

## 2018-11-21 ENCOUNTER — Telehealth: Payer: Self-pay | Admitting: *Deleted

## 2018-11-21 NOTE — Telephone Encounter (Signed)
Received notification from [name] that patient has been approved for Eliquis through 04/23/19

## 2018-11-24 ENCOUNTER — Other Ambulatory Visit: Payer: Self-pay | Admitting: Pharmacy Technician

## 2018-11-24 NOTE — Patient Outreach (Signed)
[facility]) Care Management  11/24/2018  Ritika Hellickson [facility] Oct 13, 1932 [id number]    Follow up call placed to [employer] regarding patient assistance application(s) for Eliquis , [place] confirms patient has been approved as of 7/27 until 04/23/19. Medication has been shipped out to patient.  Follow up:  Will follow up with patient in 5-7 business days to confirm medication has been received.  Maud Deed Chana Bode [place] Certified Pharmacy Technician [place] Management Direct Dial:[phone]

## 2018-11-27 DIAGNOSIS — M1711 Unilateral primary osteoarthritis, right knee: Secondary | ICD-10-CM | POA: Diagnosis not present

## 2018-12-15 ENCOUNTER — Other Ambulatory Visit: Payer: Self-pay | Admitting: Pharmacy Technician

## 2018-12-15 ENCOUNTER — Other Ambulatory Visit: Payer: Self-pay | Admitting: Pharmacist

## 2018-12-15 NOTE — Patient Outreach (Signed)
[facility]) Care Management [place]  12/15/2018  Rhonda [name] [facility] 1932/09/28 [id number]  Reason for referral: medication assistance  [facility] pharmacy case is being closed due to the following reasons:  -Goals of care have been met.   Patient given callback information if additional pharmacy support is needed in the future.  Regina Eck, PharmD, [place]  [phone]

## 2018-12-15 NOTE — Patient Outreach (Signed)
[place] [facility]) Care Management  12/15/2018  Aanika Tauscher [facility] Nov 15, 1932 [id number]    Successful call placed to patient regarding patient assistance medication delivery of Eliquis, HIPAA identifiers verified. Ms. Dauphinais confirms that she received her 3 month supply of medication. Reviewed with Ms. Juhas on obtaining refills and requested that she contact me with any questions or issues. Ms. Stephan has not additional questions at this time.  Follow up:  Will route note to [place] for case closure  Maud Deed. Chana Bode [place] Certified Pharmacy Technician [place] Management Direct [phone]

## 2018-12-16 NOTE — Progress Notes (Signed)
Cardiology Office Note   Date:  12/17/2018   ID:  Rickya, Ketler [birth year], MRN [id number]  PCP:  Dettinger, Fransisca Kaufmann, MD  Cardiologist:   [name], MD   Chief Complaint  Patient presents with  . Atrial Fibrillation      History of Present Illness: Rhonda [name] is a 85 y.o. female who with atrial fib.  She was in the hospital in Jan with this.  She was treated with flecainide, Eliquis and Diltiazem.   In Feb she had a POET ([employer] Exercise Treadmill). In late June she was in the ED a few days ago with SVT. She was treated with adenosine.   Since I last saw her she has done well.  The patient denies any new symptoms such as chest discomfort, neck or arm discomfort. There has been no new shortness of breath, PND or orthopnea. There have been no reported palpitations, presyncope or syncope.   Past Medical History:  Diagnosis Date  . Arthritis   . Atrial fibrillation ([place]) 05/2015  . Osteopenia   . Syncope 04/2014    Past Surgical History:  Procedure Laterality Date  . BREAST EXCISIONAL BIOPSY Left   . BREAST EXCISIONAL BIOPSY Right   . COLONOSCOPY  2011  . KNEE ARTHROCENTESIS Bilateral   . POLYPECTOMY  11-01-2009  . ROTATOR CUFF REPAIR    . SHOULDER SURGERY Left   . TOTAL KNEE ARTHROPLASTY Left   . VAGINAL HYSTERECTOMY     twice - vaginal and then oopherectomy     Current Outpatient Medications  Medication Sig Dispense Refill  . acetaminophen (TYLENOL) 650 MG CR tablet Take 1,300 mg by mouth every 8 (eight) hours as needed for pain.     . cholecalciferol (VITAMIN D3) 25 MCG (1000 UT) tablet Take 2,000 Units by mouth 2 (two) times daily.    Marland Kitchen diltiazem (CARDIZEM CD) 360 MG 24 hr capsule Take 1 capsule (360 mg total) by mouth daily. 90 capsule 3  . ELIQUIS 5 MG TABS tablet TAKE ONE TABLET BY MOUTH TWICE DAILY 180 tablet 3  . flecainide (TAMBOCOR) 50 MG tablet Take 1 tablet (50 mg total) by mouth 2 (two) times daily. 180 tablet 1  . zolpidem  (AMBIEN) 5 MG tablet Take 1 tablet (5 mg total) by mouth at bedtime as needed for sleep. 30 tablet 5   No current facility-administered medications for this visit.     Allergies:   Patient has no known allergies.    ROS:  Please see the history of present illness.   Otherwise, review of systems are positive for none.   All other systems are reviewed and negative.    PHYSICAL EXAM: VS:  BP 128/78   Pulse 68   Ht 5\' 5"  (1.651 m)   Wt 168 lb (76.2 kg)   BMI 27.96 kg/m  , BMI Body mass index is 27.96 kg/m. GENERAL:  Well appearing NECK:  No jugular venous distention, waveform within normal limits, carotid upstroke brisk and symmetric, no bruits, no thyromegaly LUNGS:  Clear to auscultation bilaterally CHEST:  Unremarkable HEART:  PMI not displaced or sustained,S1 and S2 within normal limits, no S3, no S4, no clicks, no rubs, no murmurs ABD:  Flat, positive bowel sounds normal in frequency in pitch, no bruits, no rebound, no guarding, no midline pulsatile mass, no hepatomegaly, no splenomegaly EXT:  2 plus pulses throughout, no edema, no cyanosis no clubbing   EKG:  EKG is not ordered today.  Recent Labs: 06/19/2018: ALT 7; TSH 2.600 10/18/2018: BUN 22; Creatinine, Ser 0.89; Hemoglobin 14.2; Platelets 242; Potassium 4.6; Sodium 138    Lipid Panel    Component Value Date/Time   CHOL 163 11/18/2017 0932   TRIG 145 11/18/2017 0932   HDL 45 11/18/2017 0932   CHOLHDL 3.6 11/18/2017 0932   LDLCALC 89 11/18/2017 0932      Wt Readings from Last 3 Encounters:  12/17/18 168 lb (76.2 kg)  11/10/18 175 lb (79.4 kg)  10/23/18 168 lb (76.2 kg)      Other studies Reviewed: Additional studies/ records that were reviewed today include: None. Review of the above records demonstrates:  Please see elsewhere in the note.     ASSESSMENT AND PLAN:  Persistent atrial fibrillation:     The patient has had no symptomatic recurrence.  Tolerates anticoagulation.  No change in therapy.   Hypertension:    Blood pressure is controlled.  She will continue the meds as listed.  SVT:  Has had no symptomatic recurrence.  We talked about the physiology.  She will call [phone] if she has another recurrence in then we reconsider ablation.    Current medicines are reviewed at length with the patient today.  The patient does not have concerns regarding medicines.  The following changes have been made:  no change  Labs/ tests ordered today include: None No orders of the defined types were placed in this encounter.    Disposition:   FU with me in one year.     Signed, [name], MD  12/17/2018 12:29 PM    [facility]

## 2018-12-17 ENCOUNTER — Other Ambulatory Visit: Payer: Self-pay | Admitting: Family Medicine

## 2018-12-17 ENCOUNTER — Ambulatory Visit (INDEPENDENT_AMBULATORY_CARE_PROVIDER_SITE_OTHER): Payer: PPO | Admitting: Cardiology

## 2018-12-17 ENCOUNTER — Other Ambulatory Visit: Payer: Self-pay

## 2018-12-17 ENCOUNTER — Encounter: Payer: Self-pay | Admitting: Cardiology

## 2018-12-17 VITALS — BP 128/78 | HR 68 | Ht 65.0 in | Wt 168.0 lb

## 2018-12-17 DIAGNOSIS — I471 Supraventricular tachycardia: Secondary | ICD-10-CM | POA: Diagnosis not present

## 2018-12-17 DIAGNOSIS — F5101 Primary insomnia: Secondary | ICD-10-CM

## 2018-12-17 DIAGNOSIS — I10 Essential (primary) hypertension: Secondary | ICD-10-CM

## 2018-12-17 DIAGNOSIS — I480 Paroxysmal atrial fibrillation: Secondary | ICD-10-CM | POA: Diagnosis not present

## 2018-12-17 NOTE — Patient Instructions (Signed)
Medication Instructions:  The current medical regimen is effective;  continue present plan and medications.  If you need a refill on your cardiac medications before your next appointment, please call your pharmacy.   Follow-Up: Follow up in 1 year with Dr. Hochrein.  You will receive a letter in the mail 2 months before you are due.  Please call us when you receive this letter to schedule your follow up appointment.  Thank you for choosing [facility]!!     

## 2018-12-18 ENCOUNTER — Ambulatory Visit (INDEPENDENT_AMBULATORY_CARE_PROVIDER_SITE_OTHER): Payer: PPO | Admitting: Family Medicine

## 2018-12-18 ENCOUNTER — Encounter: Payer: Self-pay | Admitting: Family Medicine

## 2018-12-18 VITALS — BP 123/70 | HR 69 | Temp 97.3°F | Ht 65.0 in | Wt 169.0 lb

## 2018-12-18 DIAGNOSIS — E663 Overweight: Secondary | ICD-10-CM

## 2018-12-18 DIAGNOSIS — I10 Essential (primary) hypertension: Secondary | ICD-10-CM | POA: Diagnosis not present

## 2018-12-18 DIAGNOSIS — F5101 Primary insomnia: Secondary | ICD-10-CM | POA: Diagnosis not present

## 2018-12-18 DIAGNOSIS — I4811 Longstanding persistent atrial fibrillation: Secondary | ICD-10-CM | POA: Diagnosis not present

## 2018-12-18 MED ORDER — FLECAINIDE ACETATE 50 MG PO TABS: 50.0000 mg | ORAL_TABLET | Freq: Two times a day (BID) | ORAL | 1 refills | Status: DC

## 2018-12-18 MED ORDER — ZOLPIDEM TARTRATE 5 MG PO TABS: 5.0000 mg | ORAL_TABLET | Freq: Every evening | ORAL | 5 refills | Status: DC | PRN

## 2018-12-18 NOTE — Progress Notes (Signed)
BP 123/70   Pulse 69   Temp (!) 97.3 F (36.3 C) (Temporal)   Ht 5' 5" (1.651 m)   Wt 169 lb (76.7 kg)   BMI 28.12 kg/m    Subjective:   Patient ID: Rhonda [name], female    DOB: [birth year], 85 y.o.   MRN: [id number]  HPI: Rhonda [name] is a 85 y.o. female presenting on 12/18/2018 for Medical Management of Chronic Issues (6 month follow up)   HPI Hypertension Patient is currently on diltiazem, and their blood pressure today is 123/70 with a heart rate of 69. Patient denies any lightheadedness or dizziness. Patient denies headaches, blurred vision, chest pains, shortness of breath, or weakness. Denies any side effects from medication and is content with current medication.   Insomnia Is coming in to discuss insomnia.  She currently uses Ambien and says that is working well for her denies any major side effects from it.  She takes 5 mg nightly and needs a refill for it.  It has been doing well for her for quite some time.  Patient has A. fib and is on medication for this and managed by cardiology including flecainide Eliquis and diltiazem, will continue these medications.  Relevant past medical, surgical, family and social history reviewed and updated as indicated. Interim medical history since our last visit reviewed. Allergies and medications reviewed and updated.  Review of Systems  Constitutional: Negative for chills and fever.  Eyes: Negative for visual disturbance.  Respiratory: Negative for chest tightness and shortness of breath.   Cardiovascular: Negative for chest pain and leg swelling.  Musculoskeletal: Negative for back pain and gait problem.  Skin: Negative for rash.  Neurological: Negative for light-headedness and headaches.  Psychiatric/Behavioral: Positive for sleep disturbance. Negative for agitation and behavioral problems.  All other systems reviewed and are negative.   Per HPI unless specifically indicated above   Allergies as of 12/18/2018    No Known Allergies     Medication List       Accurate as of December 18, 2018  9:01 AM. If you have any questions, ask your nurse or doctor.        acetaminophen 650 MG CR tablet Commonly known as: TYLENOL Take 1,300 mg by mouth every 8 (eight) hours as needed for pain.   cholecalciferol 25 MCG (1000 UT) tablet Commonly known as: VITAMIN D3 Take 2,000 Units by mouth 2 (two) times daily.   diltiazem 360 MG 24 hr capsule Commonly known as: CARDIZEM CD Take 1 capsule (360 mg total) by mouth daily.   Eliquis 5 MG Tabs tablet Generic drug: apixaban TAKE ONE TABLET BY MOUTH TWICE DAILY   flecainide 50 MG tablet Commonly known as: TAMBOCOR Take 1 tablet (50 mg total) by mouth 2 (two) times daily.   zolpidem 5 MG tablet Commonly known as: AMBIEN Take 1 tablet (5 mg total) by mouth at bedtime as needed for sleep.        Objective:   BP 123/70   Pulse 69   Temp (!) 97.3 F (36.3 C) (Temporal)   Ht 5' 5" (1.651 m)   Wt 169 lb (76.7 kg)   BMI 28.12 kg/m   Wt Readings from Last 3 Encounters:  12/18/18 169 lb (76.7 kg)  12/17/18 168 lb (76.2 kg)  11/10/18 175 lb (79.4 kg)    Physical Exam Vitals signs and nursing note reviewed.  Constitutional:      General: She is not in acute distress.  Appearance: She is well-developed. She is not diaphoretic.  Eyes:     Conjunctiva/sclera: Conjunctivae normal.  Cardiovascular:     Rate and Rhythm: Normal rate and regular rhythm.     Heart sounds: Normal heart sounds. No murmur.  Pulmonary:     Effort: Pulmonary effort is normal. No respiratory distress.     Breath sounds: Normal breath sounds. No wheezing.  Musculoskeletal: Normal range of motion.        General: No tenderness.  Skin:    General: Skin is warm and dry.     Findings: No rash.  Neurological:     Mental Status: She is alert and oriented to person, place, and time.     Coordination: Coordination normal.  Psychiatric:        Behavior: Behavior normal.       Assessment & Plan:   Problem List Items Addressed This Visit      Cardiovascular and Mediastinum   Atrial fibrillation (HCC)   Relevant Medications   flecainide (TAMBOCOR) 50 MG tablet   Essential hypertension - Primary   Relevant Medications   flecainide (TAMBOCOR) 50 MG tablet   Other Relevant Orders   CMP14+EGFR     Other   Overweight (BMI 25.0-29.9)   Relevant Orders   Lipid panel   Insomnia   Relevant Medications   zolpidem (AMBIEN) 5 MG tablet   Other Relevant Orders   CBC with Differential/Platelet      Continue current medication, no changes.  Follow up plan: Return in about 6 months (around 06/20/2019), or if symptoms worsen or fail to improve, for Hypertension insomnia recheck.  Counseling provided for all of the vaccine components Orders Placed This Encounter  Procedures  . CBC with Differential/Platelet  . CMP14+EGFR  . Lipid panel    Joshua Dettinger, MD [facility] 12/18/2018, 9:01 AM     

## 2018-12-19 LAB — CBC WITH DIFFERENTIAL/PLATELET
Basophils Absolute: 0.1 10*3/uL (ref 0.0–0.2)
Basos: 1 %
EOS (ABSOLUTE): 0.1 10*3/uL (ref 0.0–0.4)
Eos: 1 %
Hematocrit: 43.5 % (ref 34.0–46.6)
Hemoglobin: 14.7 g/dL (ref 11.1–15.9)
Immature Grans (Abs): 0 10*3/uL (ref 0.0–0.1)
Immature Granulocytes: 0 %
Lymphocytes Absolute: 1.4 10*3/uL (ref 0.7–3.1)
Lymphs: 19 %
MCH: 30.2 pg (ref 26.6–33.0)
MCHC: 33.8 g/dL (ref 31.5–35.7)
MCV: 89 fL (ref 79–97)
Monocytes Absolute: 0.6 10*3/uL (ref 0.1–0.9)
Monocytes: 8 %
Neutrophils Absolute: 5.1 10*3/uL (ref 1.4–7.0)
Neutrophils: 71 %
Platelets: 275 10*3/uL (ref 150–450)
RBC: 4.87 x10E6/uL (ref 3.77–5.28)
RDW: 12 % (ref 11.7–15.4)
WBC: 7.3 10*3/uL (ref 3.4–10.8)

## 2018-12-19 LAB — CMP14+EGFR
ALT: 10 IU/L (ref 0–32)
AST: 9 IU/L (ref 0–40)
Albumin/Globulin Ratio: 2.1 (ref 1.2–2.2)
Albumin: 4.4 g/dL (ref 3.6–4.6)
Alkaline Phosphatase: 84 IU/L (ref 39–117)
BUN/Creatinine Ratio: 23 (ref 12–28)
BUN: 20 mg/dL (ref 8–27)
Bilirubin Total: 0.5 mg/dL (ref 0.0–1.2)
CO2: 25 mmol/L (ref 20–29)
Calcium: 9.5 mg/dL (ref 8.7–10.3)
Chloride: 97 mmol/L (ref 96–106)
Creatinine, Ser: 0.87 mg/dL (ref 0.57–1.00)
GFR calc Af Amer: 71 mL/min/{1.73_m2} (ref >59)
GFR calc non Af Amer: 61 mL/min/{1.73_m2} (ref >59)
Globulin, Total: 2.1 g/dL (ref 1.5–4.5)
Glucose: 89 mg/dL (ref 65–99)
Potassium: 4.3 mmol/L (ref 3.5–5.2)
Sodium: 136 mmol/L (ref 134–144)
Total Protein: 6.5 g/dL (ref 6.0–8.5)

## 2018-12-19 LAB — LIPID PANEL
Chol/HDL Ratio: 3.4 ratio (ref 0.0–4.4)
Cholesterol, Total: 182 mg/dL (ref 100–199)
HDL: 53 mg/dL (ref >39)
LDL Calculated: 102 mg/dL — ABNORMAL HIGH (ref 0–99)
Triglycerides: 137 mg/dL (ref 0–149)
VLDL Cholesterol Cal: 27 mg/dL (ref 5–40)

## 2019-02-16 ENCOUNTER — Ambulatory Visit (INDEPENDENT_AMBULATORY_CARE_PROVIDER_SITE_OTHER): Payer: PPO | Admitting: *Deleted

## 2019-02-16 DIAGNOSIS — Z0000 Encounter for general adult medical examination without abnormal findings: Secondary | ICD-10-CM | POA: Diagnosis not present

## 2019-02-16 NOTE — Patient Instructions (Signed)
Preventive Care 83 Years and Older, Female Preventive care refers to lifestyle choices and visits with your health care provider that can promote health and wellness. This includes:  A yearly physical exam. This is also called an annual well check.  Regular dental and eye exams.  Immunizations.  Screening for certain conditions.  Healthy lifestyle choices, such as diet and exercise. What can I expect for my preventive care visit? Physical exam Your health care provider will check:  Height and weight. These may be used to calculate body mass index (BMI), which is a measurement that tells if you are at a healthy weight.  Heart rate and blood pressure.  Your skin for abnormal spots. Counseling Your health care provider may ask you questions about:  Alcohol, tobacco, and drug use.  Emotional well-being.  Home and relationship well-being.  Sexual activity.  Eating habits.  History of falls.  Memory and ability to understand (cognition).  Work and work Statistician.  Pregnancy and menstrual history. What immunizations do I need?  Influenza (flu) vaccine  This is recommended every year. Tetanus, diphtheria, and pertussis (Tdap) vaccine  You may need a Td booster every 10 years. Varicella (chickenpox) vaccine  You may need this vaccine if you have not already been vaccinated. Zoster (shingles) vaccine  You may need this after age 33. Pneumococcal conjugate (PCV13) vaccine  One dose is recommended after age 33. Pneumococcal polysaccharide (PPSV23) vaccine  One dose is recommended after age 72. Measles, mumps, and rubella (MMR) vaccine  You may need at least one dose of MMR if you were born in 1957 or later. You may also need a second dose. Meningococcal conjugate (MenACWY) vaccine  You may need this if you have certain conditions. Hepatitis A vaccine  You may need this if you have certain conditions or if you travel or work in places where you may be exposed  to hepatitis A. Hepatitis B vaccine  You may need this if you have certain conditions or if you travel or work in places where you may be exposed to hepatitis B. Haemophilus influenzae type b (Hib) vaccine  You may need this if you have certain conditions. You may receive vaccines as individual doses or as more than one vaccine together in one shot (combination vaccines). Talk with your health care provider about the risks and benefits of combination vaccines. What tests do I need? Blood tests  Lipid and cholesterol levels. These may be checked every 5 years, or more frequently depending on your overall health.  Hepatitis C test.  Hepatitis B test. Screening  Lung cancer screening. You may have this screening every year starting at age 39 if you have a 30-pack-year history of smoking and currently smoke or have quit within the past 15 years.  Colorectal cancer screening. All adults should have this screening starting at age 36 and continuing until age 15. Your health care provider may recommend screening at age 23 if you are at increased risk. You will have tests every 1-10 years, depending on your results and the type of screening test.  Diabetes screening. This is done by checking your blood sugar (glucose) after you have not eaten for a while (fasting). You may have this done every 1-3 years.  Mammogram. This may be done every 1-2 years. Talk with your health care provider about how often you should have regular mammograms.  BRCA-related cancer screening. This may be done if you have a family history of breast, ovarian, tubal, or peritoneal cancers.  Other tests  Sexually transmitted disease (STD) testing.  Bone density scan. This is done to screen for osteoporosis. You may have this done starting at age 55. Follow these instructions at home: Eating and drinking  Eat a diet that includes fresh fruits and vegetables, whole grains, lean protein, and low-fat dairy products. Limit  your intake of foods with high amounts of sugar, saturated fats, and salt.  Take vitamin and mineral supplements as recommended by your health care provider.  Do not drink alcohol if your health care provider tells you not to drink.  If you drink alcohol: ? Limit how much you have to 0-1 drink a day. ? Be aware of how much alcohol is in your drink. In the [place] one drink equals one 12 oz bottle of beer (355 mL), one 5 oz glass of wine (148 mL), or one 1 oz glass of hard liquor (44 mL). Lifestyle  Take daily care of your teeth and gums.  Stay active. Exercise for at least 30 minutes on 5 or more days each week.  Do not use any products that contain nicotine or tobacco, such as cigarettes, e-cigarettes, and chewing tobacco. If you need help quitting, ask your health care provider.  If you are sexually active, practice safe sex. Use a condom or other form of protection in order to prevent STIs (sexually transmitted infections).  Talk with your health care provider about taking a low-dose aspirin or statin. What's next?  Go to your health care provider once a year for a well check visit.  Ask your health care provider how often you should have your eyes and teeth checked.  Stay up to date on all vaccines. This information is not intended to replace advice given to you by your health care provider. Make sure you discuss any questions you have with your health care provider. Document Released: 05/06/2015 Document Revised: 04/03/2018 Document Reviewed: 04/03/2018 Elsevier Patient Education  2020 [employer].

## 2019-02-16 NOTE — Progress Notes (Signed)
MEDICARE ANNUAL WELLNESS VISIT  02/16/2019  Telephone Visit Disclaimer This Medicare AWV was conducted by telephone due to national recommendations for restrictions regarding the COVID-19 Pandemic (e.g. social distancing).  I verified, using two identifiers, that I am speaking with Rhonda [name] or their authorized healthcare agent. I discussed the limitations, risks, security, and privacy concerns of performing an evaluation and management service by telephone and the potential availability of an in-person appointment in the future. The patient expressed understanding and agreed to proceed.   Subjective:  Rhonda [name] is a 85 y.o. female patient of Dettinger, Fransisca Kaufmann, MD who had a Medicare Annual Wellness Visit today via telephone. Kiala is Retired and lives alone. she has 1 child. she reports that she is socially active and does interact with friends/family regularly. she is minimally physically active and enjoys reading.  Patient Care Team: Dettinger, Fransisca Kaufmann, MD as PCP - General (Family Medicine) [name], MD as PCP - Cardiology (Cardiology) [name], MD as Consulting Physician (Cardiology) Latanya Maudlin, MD as Consulting Physician (Orthopedic Surgery)  Advanced Directives 02/16/2019 10/18/2018 05/20/2018 05/13/2018 05/11/2018 02/11/2018 10/08/2017  Does Patient Have a Medical Advance Directive? Yes No Yes No No No Yes  Type of Advance Directive Healthcare Power of [place]  Does patient want to make changes to medical advance directive? No - Patient declined - No - Patient declined - - - -  Copy of [place] in Chart? No - copy requested - No - copy requested - - - -  Would patient like information on creating a medical advance directive? - No - Patient declined No - Patient declined No - Patient declined No - Patient declined Yes (MAU/Ambulatory/Procedural Areas - Information given) -    Hospital  Utilization Over the Past 12 Months: # of hospitalizations or ER visits: 1 # of surgeries: 0  Review of Systems    Patient reports that her overall health is better compared to last year.  History obtained from chart review  Patient Reported Readings (BP, Pulse, CBG, Weight, etc) none  Pain Assessment Pain : No/denies pain(pt does have some arthritis in her knee that is bothering her a little bit but she says it is about time to get an injection)     Current Medications & Allergies (verified) Allergies as of 02/16/2019   No Known Allergies     Medication List       Accurate as of February 16, 2019  8:54 AM. If you have any questions, ask your nurse or doctor.        acetaminophen 650 MG CR tablet Commonly known as: TYLENOL Take 1,300 mg by mouth every 8 (eight) hours as needed for pain.   cholecalciferol 25 MCG (1000 UT) tablet Commonly known as: VITAMIN D3 Take 2,000 Units by mouth 2 (two) times daily.   diltiazem 360 MG 24 hr capsule Commonly known as: CARDIZEM CD Take 1 capsule (360 mg total) by mouth daily.   Eliquis 5 MG Tabs tablet Generic drug: apixaban TAKE ONE TABLET BY MOUTH TWICE DAILY   flecainide 50 MG tablet Commonly known as: TAMBOCOR Take 1 tablet (50 mg total) by mouth 2 (two) times daily.   zolpidem 5 MG tablet Commonly known as: AMBIEN Take 1 tablet (5 mg total) by mouth at bedtime as needed for sleep.       History (reviewed): Past Medical History:  Diagnosis Date  . Arthritis   .  Atrial fibrillation ([place]) 05/2015  . Osteopenia   . Syncope 04/2014   Past Surgical History:  Procedure Laterality Date  . BREAST EXCISIONAL BIOPSY Left   . BREAST EXCISIONAL BIOPSY Right   . COLONOSCOPY  2011  . KNEE ARTHROCENTESIS Bilateral   . POLYPECTOMY  11-01-2009  . ROTATOR CUFF REPAIR    . SHOULDER SURGERY Left   . TOTAL KNEE ARTHROPLASTY Left   . VAGINAL HYSTERECTOMY     twice - vaginal and then oopherectomy   Family History  Problem  Relation Age of Onset  . Heart failure Mother   . Hypertension Father   . Arthritis Father   . Hypertension Sister   . Arthritis Sister   . Cancer Brother   . Bladder Cancer Brother   . Hypertension Brother   . Diabetes Brother    Social History   Socioeconomic History  . Marital status: Widowed    Spouse name: Not on file  . Number of children: 1  . Years of education: 64  . Highest education level: High school graduate  Occupational History  . Occupation: retired  Scientific laboratory technician  . Financial resource strain: Not hard at all  . Food insecurity    Worry: Never true    Inability: Never true  . Transportation needs    Medical: No    Non-medical: No  Tobacco Use  . Smoking status: Never Smoker  . Smokeless tobacco: Never Used  Substance and Sexual Activity  . Alcohol use: No    Alcohol/week: 0.0 standard drinks  . Drug use: No  . Sexual activity: Not Currently  Lifestyle  . Physical activity    Days per week: 7 days    Minutes per session: 20 min  . Stress: Only a little  Relationships  . Social connections    Talks on phone: More than three times a week    Gets together: More than three times a week    Attends religious service: More than 4 times per year    Active member of club or organization: Yes    Attends meetings of clubs or organizations: More than 4 times per year    Relationship status: Widowed  Other Topics Concern  . Not on file  Social History Narrative         Activities of Daily Living In your present state of health, do you have any difficulty performing the following activities: 02/16/2019 05/20/2018  Hearing? N -  Vision? N -  Comment wears glasses and gets yearly eye exams -  Difficulty concentrating or making decisions? N -  Walking or climbing stairs? N -  Dressing or bathing? N -  Doing errands, shopping? N N  Preparing Food and eating ? N -  Using the Toilet? N -  In the past six months, have you accidently leaked urine? Y -   Comment mostly at night but it doesn't happen often -  Do you have problems with loss of bowel control? N -  Managing your Medications? N -  Managing your Finances? N -  Housekeeping or managing your Housekeeping? N -  Some recent data might be hidden    Patient Education/ Literacy How often do you need to have someone help you when you read instructions, pamphlets, or other written materials from your doctor or pharmacy?: 1 - Never What is the last grade level you completed in school?: 12th grade  Exercise Current Exercise Habits: Home exercise routine, Type of exercise: stretching;walking, Time (Minutes):  20, Frequency (Times/Week): 7, Weekly Exercise (Minutes/Week): 140, Intensity: Mild, Exercise limited by: orthopedic condition(s)  Diet Patient reports consuming 3 meals a day and 2 snack(s) a day Patient reports that her primary diet is: Regular Patient reports that she does have regular access to food.   Depression Screen PHQ 2/9 Scores 02/16/2019 12/18/2018 09/16/2018 06/19/2018 05/13/2018 02/11/2018 11/18/2017  PHQ - 2 Score 0 0 0 0 0 0 0     Fall Risk Fall Risk  02/16/2019 12/18/2018 09/16/2018 06/19/2018 02/11/2018  Falls in the past year? 0 0 0 0 No  Number falls in past yr: 0 - - - -  Injury with Fall? 0 - - - -  Follow up Falls prevention discussed - - - -  Comment Get rid of all throw rugs in the house, adequate lighting in the walkways and grab bars in the bathroom - - - -     Objective:  Rhonda [name] seemed alert and oriented and she participated appropriately during our telephone visit.  Blood Pressure Weight BMI  BP Readings from Last 3 Encounters:  12/18/18 123/70  12/17/18 128/78  11/10/18 125/71   Wt Readings from Last 3 Encounters:  12/18/18 169 lb (76.7 kg)  12/17/18 168 lb (76.2 kg)  11/10/18 175 lb (79.4 kg)   BMI Readings from Last 1 Encounters:  12/18/18 28.12 kg/m    *Unable to obtain current vital signs, weight, and BMI due to telephone  visit type  Hearing/Vision  . [place] did not seem to have difficulty with hearing/understanding during the telephone conversation . Reports that she has had a formal eye exam by an eye care professional within the past year . Reports that she has not had a formal hearing evaluation within the past year *Unable to fully assess hearing and vision during telephone visit type  Cognitive Function: 6CIT Screen 02/16/2019  What Year? 0 points  What month? 0 points  What time? 0 points  Count back from 20 0 points  Months in reverse 0 points  Repeat phrase 2 points  Total Score 2   (Normal:0-7, Significant for Dysfunction: >8)  Normal Cognitive Function Screening: Yes   Immunization & Health Maintenance Record Immunization History  Administered Date(s) Administered  . Influenza, High Dose Seasonal PF 03/19/2016, 02/27/2017, 02/11/2018  . Influenza, Seasonal, Injecte, Preservative Fre 03/23/2015  . Influenza,inj,Quad PF,6+ Mos 03/23/2015  . Influenza,inj,quad, With Preservative 01/21/2017  . Influenza,trivalent, recombinat, inj, PF 03/11/2014  . Influenza-Unspecified 02/13/2006  . Pneumococcal Conjugate-13 09/24/2016  . Pneumococcal Polysaccharide-23 11/18/2017    Health Maintenance  Topic Date Due  . DEXA SCAN  05/14/2018  . INFLUENZA VACCINE  11/22/2018  . TETANUS/TDAP  12/18/2019 (Originally 11/27/1951)  . PNA vac Low Risk Adult  Completed       Assessment  This is a routine wellness examination for Ngina Cotnoir Gunner.  Health Maintenance: Due or Overdue Health Maintenance Due  Topic Date Due  . DEXA SCAN  05/14/2018  . INFLUENZA VACCINE  11/22/2018    Rhonda [name] does not need a referral for Community Assistance: Care Management:   no Social Work:    no Prescription Assistance:  no Nutrition/Diabetes Education:  no   Plan:  Personalized Goals Goals Addressed            This Visit's Progress   . DIET - INCREASE WATER INTAKE       Try to drink 6-8  glasses of water daily      Personalized Health Maintenance &  Screening Recommendations  Influenza vaccine Td vaccine Advanced directives: has an advanced directive - a copy HAS NOT been provided. Shingles vaccine  Lung Cancer Screening Recommended: no (Low Dose CT Chest recommended if Age 25-80 years, 30 pack-year currently smoking OR have quit w/in past 15 years) Hepatitis C Screening recommended: no HIV Screening recommended: no  Advanced Directives: Written information was not prepared per patient's request.  Referrals & Orders No orders of the defined types were placed in this encounter.   Follow-up Plan . Follow-up with Dettinger, Fransisca Kaufmann, MD as planned . Schedule your DEXA as discussed . Speak to your pharmacy about the TDAP and Shingles vaccine due to your insurance you have to get them at the pharmacy . Bring a copy of your Advanced Directive in for our records   I have personally reviewed and noted the following in the patient's chart:   . Medical and social history . Use of alcohol, tobacco or illicit drugs  . Current medications and supplements . Functional ability and status . Nutritional status . Physical activity . Advanced directives . List of other physicians . Hospitalizations, surgeries, and ER visits in previous 12 months . Vitals . Screenings to include cognitive, depression, and falls . Referrals and appointments  In addition, I have reviewed and discussed with Rhonda [name] certain preventive protocols, quality metrics, and best practice recommendations. A written personalized care plan for preventive services as well as general preventive health recommendations is available and can be mailed to the patient at her request.      Milas Hock, LPN  [phone]

## 2019-02-25 DIAGNOSIS — M1711 Unilateral primary osteoarthritis, right knee: Secondary | ICD-10-CM | POA: Diagnosis not present

## 2019-02-26 ENCOUNTER — Other Ambulatory Visit: Payer: Self-pay

## 2019-02-27 ENCOUNTER — Ambulatory Visit (INDEPENDENT_AMBULATORY_CARE_PROVIDER_SITE_OTHER): Payer: PPO | Admitting: *Deleted

## 2019-02-27 DIAGNOSIS — Z23 Encounter for immunization: Secondary | ICD-10-CM | POA: Diagnosis not present

## 2019-03-26 ENCOUNTER — Telehealth: Payer: Self-pay | Admitting: Family Medicine

## 2019-03-26 NOTE — Chronic Care Management (AMB) (Signed)
°  Chronic Care Management   Outreach Note  03/26/2019 Name: Rhonda [name] MRN: [id number] DOB: Jun 17, 1932  Referred by: Dettinger, Fransisca Kaufmann, MD Reason for referral : Chronic Care Management (Initial CCM outreach was unsuccessful. )   An unsuccessful telephone outreach was attempted today. The patient was referred to the case management team by for assistance with care management and care coordination.   Follow Up Plan: The care management team will reach out to the patient again over the next 7 days.   [place], [place] [address] Direct Dial: [email or web address]  Website: [email or web address]

## 2019-03-31 NOTE — Chronic Care Management (AMB) (Signed)
Chronic Care Management  ° °Note ° °03/31/2019 °Name: Rhonda [name] MRN: [id number] DOB: 12/02/1932 ° °Rhonda [name] is a 86 y.o. year old female who is a primary care patient of Dettinger, Joshua A, MD. I reached out to Rhonda [name] by phone today in response to a referral sent by Rhonda [name]'s health plan.    ° °Rhonda [name] was given information about Chronic Care Management services today including:  °1. CCM service includes personalized support from designated clinical staff supervised by her physician, including individualized plan of care and coordination with other care providers °2. 24/7 contact phone numbers for assistance for urgent and routine care needs. °3. Service will only be billed when office clinical staff spend 20 minutes or more in a month to coordinate care. °4. Only one practitioner may furnish and bill the service in a calendar month. °5. The patient may stop CCM services at any time (effective at the end of the month) by phone call to the office staff. °6. The patient will be responsible for cost sharing (co-pay) of up to 20% of the service fee (after annual deductible is met). ° °Patient agreed to services and verbal consent obtained.  ° °Follow up plan: °Telephone appointment with CCM team member scheduled for: 04/21/2019 ° °Bernice Cicero °Care Guide, Embedded Care Coordination °[facility]  °[place], [place] [address] °Direct Dial: [phone] °[email or web address]  °Website: [email or web address]  ° ° °

## 2019-04-21 ENCOUNTER — Ambulatory Visit (INDEPENDENT_AMBULATORY_CARE_PROVIDER_SITE_OTHER): Payer: PPO | Admitting: *Deleted

## 2019-04-21 DIAGNOSIS — I4811 Longstanding persistent atrial fibrillation: Secondary | ICD-10-CM | POA: Diagnosis not present

## 2019-04-21 DIAGNOSIS — M8589 Other specified disorders of bone density and structure, multiple sites: Secondary | ICD-10-CM

## 2019-04-21 DIAGNOSIS — I10 Essential (primary) hypertension: Secondary | ICD-10-CM | POA: Diagnosis not present

## 2019-04-21 NOTE — Patient Instructions (Signed)
Visit Information  Goals Addressed            This Visit's Progress     Patient Stated   . Prescription Assistance (pt-stated)       Current Barriers:  Marland Kitchen Knowledge Deficits related to prescription assistance program  Nurse Case Manager Clinical Goal(s):  Marland Kitchen Over the next 30 days, patient will work with Consulting civil engineer to address needs related to Eliquis prescription assistance program  Interventions:  . Discussed patient's prior enrollment in [employer] Patient Assistance program from [place] . Discussed forms and requirements . Patient will complete her portion and bring to [facility] Attn: Mirza Kidney . I will work with PCP clinical staff to complete the provider portion and send in to [employer] . Samples may be available if needed  Patient Self Care Activities:  . Performs ADL's independently . Performs IADL's independently   Initial goal documentation       Other   . Chronic Disease Management Needs       Current Barriers:  . Chronic Disease Management support, education, and care coordination needs related to Afib, HTN, Arthritis, osteopenia  Clinical Goal(s) related to Afib, HTN, Arthritis, osteopenia:  Over the next 60 days, patient will:  . Work with the care management team to address educational, disease management, and care coordination needs  . Call provider office for new or worsened signs and symptoms  . Call care management team with questions or concerns . Verbalize basic understanding of patient centered plan of care established today  Interventions related to Afib, HTN, Arthritis, osteopenia:  . Evaluation of current treatment plans and patient's adherence to plan as established by provider . Assessed patient understanding of disease states . Assessed patient's education and care coordination needs . Provided disease specific education to patient  . Collaborated with appropriate clinical care team members regarding patient needs  Patient Self Care Activities related to  Afib, HTN, Arthritis, osteopenia:  . Patient is unable to independently self-manage chronic health conditions  Initial goal documentation         The care management team will reach out to the patient again over the next 30 days.    Ms. Bigos was given information about Chronic Care Management services today including:  1. CCM service includes personalized support from designated clinical staff supervised by her physician, including individualized plan of care and coordination with other care providers 2. 24/7 contact phone numbers for assistance for urgent and routine care needs. 3. Service will only be billed when office clinical staff spend 20 minutes or more in a month to coordinate care. 4. Only one practitioner may furnish and bill the service in a calendar month. 5. The patient may stop CCM services at any time (effective at the end of the month) by phone call to the office staff. 6. The patient will be responsible for cost sharing (co-pay) of up to 20% of the service fee (after annual deductible is met).  Patient agreed to services and verbal consent obtained.   Chong Sicilian, BSN, RN-BC Embedded Chronic Care Manager [facility] [place] [facility] Direct Dial: [phone]   The patient verbalized understanding of instructions provided today and declined a print copy of patient instruction materials.

## 2019-04-21 NOTE — Chronic Care Management (AMB) (Signed)
Chronic Care Management   Initial Visit Note  04/21/2019 Name: Rhonda [name] MRN: [id number] DOB: 1933/02/04  Referred by: Dettinger, Fransisca Kaufmann, MD Reason for referral : Chronic Care Management (RN Initial Visit)   Shelisa Grist is a 85 y.o. year old female who is a primary care patient of Dettinger, Fransisca Kaufmann, MD. The CCM team was consulted for assistance with chronic disease management and care coordination needs related to Afib, HTN, Arthritis, osteopenia.   Review of patient status, including review of consultants reports, relevant laboratory and other test results, and collaboration with appropriate care team members and the patient's provider was performed as part of comprehensive patient evaluation and provision of chronic care management services.    SDOH (Social Determinants of Health) screening performed today: None. See Care Plan for related entries.   Subjective: I spoke with Ms Quale today by telphone. She is interested in working with the CCM team to assist in self management of her chronic medical conditions. Her primary concern right now is reapplying for patient assistance through [employer]MS for Eliquis.   Objective:  Outpatient Encounter Medications as of 04/21/2019  Medication Sig  . acetaminophen (TYLENOL) 650 MG CR tablet Take 1,300 mg by mouth every 8 (eight) hours as needed for pain.   . cholecalciferol (VITAMIN D3) 25 MCG (1000 UT) tablet Take 2,000 Units by mouth 2 (two) times daily.  Marland Kitchen ELIQUIS 5 MG TABS tablet TAKE ONE TABLET BY MOUTH TWICE DAILY  . flecainide (TAMBOCOR) 50 MG tablet Take 1 tablet (50 mg total) by mouth 2 (two) times daily.  Marland Kitchen zolpidem (AMBIEN) 5 MG tablet Take 1 tablet (5 mg total) by mouth at bedtime as needed for sleep.  Marland Kitchen diltiazem (CARDIZEM CD) 360 MG 24 hr capsule Take 1 capsule (360 mg total) by mouth daily.   No facility-administered encounter medications on file as of 04/21/2019.     RN Assessment & Care Plan     .  Prescription Assistance (pt-stated)       Current Barriers:  Marland Kitchen Knowledge Deficits related to prescription assistance program  Nurse Case Manager Clinical Goal(s):  Marland Kitchen Over the next 30 days, patient will work with Consulting civil engineer to address needs related to Eliquis prescription assistance program  Interventions:  . Discussed patient's prior enrollment in [employer] Patient Assistance program from [place] . Discussed forms and requirements . Patient will complete her portion and bring to [facility] Attn: Kimetha Trulson . I will work with PCP clinical staff to complete the provider portion and send in to [employer] . Samples may be available if needed  Patient Self Care Activities:  . Performs ADL's independently . Performs IADL's independently   Initial goal documentation       . Chronic Disease Management Needs       Current Barriers:  . Chronic Disease Management support, education, and care coordination needs related to Afib, HTN, Arthritis, osteopenia  Clinical Goal(s) related to Afib, HTN, Arthritis, osteopenia:  Over the next 60 days, patient will:  . Work with the care management team to address educational, disease management, and care coordination needs  . Call provider office for new or worsened signs and symptoms  . Call care management team with questions or concerns . Verbalize basic understanding of patient centered plan of care established today  Interventions related to Afib, HTN, Arthritis, osteopenia:  . Evaluation of current treatment plans and patient's adherence to plan as established by provider . Assessed patient understanding of disease states . Assessed patient's  education and care coordination needs . Provided disease specific education to patient  . Collaborated with appropriate clinical care team members regarding patient needs  Patient Self Care Activities related to Afib, HTN, Arthritis, osteopenia:  . Patient is unable to independently self-manage chronic health  conditions  Initial goal documentation         Follow-up Plan:  The care management team will reach out to the patient again over the next 30 days.    Chong Sicilian, BSN, RN-BC Embedded Chronic Care Manager [facility] [place] [facility] Direct Dial: [phone]

## 2019-05-13 DIAGNOSIS — M1711 Unilateral primary osteoarthritis, right knee: Secondary | ICD-10-CM | POA: Diagnosis not present

## 2019-05-19 ENCOUNTER — Ambulatory Visit: Payer: PPO | Admitting: *Deleted

## 2019-05-19 DIAGNOSIS — I4811 Longstanding persistent atrial fibrillation: Secondary | ICD-10-CM

## 2019-05-19 NOTE — Patient Instructions (Signed)
Visit Information  Goals Addressed            This Visit's Progress     Patient Stated   . Prescription Assistance (pt-stated)       Current Barriers:  Marland Kitchen Knowledge Deficits related to prescription assistance program  Nurse Case Manager Clinical Goal(s):  Marland Kitchen Over the next 30 days, patient will work with Consulting civil engineer to address needs related to Eliquis prescription assistance program  Interventions:  . Talked with patient regarding [employer] patient assistance for Eliquis o She has most of her documents but is waiting on SS statement for this year . Advised patient to bring completed paperwork and required documents to [facility] Attn: Ravindra Baranek . Discussed medication supply. Patient has enough to last until February.  . Will collaborate with [facility] clinical staff regarding sample availability.   Patient Self Care Activities:  . Performs ADL's independently . Performs IADL's independently   Please see past updates related to this goal by clicking on the "Past Updates" button in the selected goal         The care management team will reach out to the patient again over the next 30 days.    Chong Sicilian, BSN, RN-BC Embedded Chronic Care Manager [facility] [place] [facility] Direct Dial: [phone]   The patient verbalized understanding of instructions provided today and declined a print copy of patient instruction materials.

## 2019-05-19 NOTE — Chronic Care Management (AMB) (Signed)
  Chronic Care Management   Follow Up Note   05/19/2019 Name: Karlin Mauss MRN: [id number] DOB: 1932-05-04  Referred by: Dettinger, Fransisca Kaufmann, MD Reason for referral : Chronic Care Management (RN follow up)   Melaya Stasko is a 86 y.o. year old female who is a primary care patient of Dettinger, Fransisca Kaufmann, MD. The CCM team was consulted for assistance with chronic disease management and care coordination needs.    Review of patient status, including review of consultants reports, relevant laboratory and other test results, and collaboration with appropriate care team members and the patient's provider was performed as part of comprehensive patient evaluation and provision of chronic care management services.    Outpatient Encounter Medications as of 05/19/2019  Medication Sig  . acetaminophen (TYLENOL) 650 MG CR tablet Take 1,300 mg by mouth every 8 (eight) hours as needed for pain.   . cholecalciferol (VITAMIN D3) 25 MCG (1000 UT) tablet Take 2,000 Units by mouth 2 (two) times daily.  Marland Kitchen diltiazem (CARDIZEM CD) 360 MG 24 hr capsule Take 1 capsule (360 mg total) by mouth daily.  Marland Kitchen ELIQUIS 5 MG TABS tablet TAKE ONE TABLET BY MOUTH TWICE DAILY  . flecainide (TAMBOCOR) 50 MG tablet Take 1 tablet (50 mg total) by mouth 2 (two) times daily.  Marland Kitchen zolpidem (AMBIEN) 5 MG tablet Take 1 tablet (5 mg total) by mouth at bedtime as needed for sleep.   No facility-administered encounter medications on file as of 05/19/2019.     RN Care Plan   . Prescription Assistance (pt-stated)       Current Barriers:  Marland Kitchen Knowledge Deficits related to prescription assistance program  Nurse Case Manager Clinical Goal(s):  Marland Kitchen Over the next 30 days, patient will work with Consulting civil engineer to address needs related to Eliquis prescription assistance program  Interventions:  . Talked with patient regarding BMS patient assistance for Eliquis o She has most of her documents but is waiting on SS statement for this  year . Advised patient to bring completed paperwork and required documents to [facility] Attn: Matan Steen . Discussed medication supply. Patient has enough to last until February.  . Will collaborate with [facility] clinical staff regarding sample availability.   Patient Self Care Activities:  . Performs ADL's independently . Performs IADL's independently   Please see past updates related to this goal by clicking on the "Past Updates" button in the selected goal          Plan:   The care management team will reach out to the patient again over the next 30 days.    Chong Sicilian, BSN, RN-BC Embedded Chronic Care Manager [facility] [place] Management Direct Dial: [phone]

## 2019-05-25 ENCOUNTER — Other Ambulatory Visit: Payer: Self-pay | Admitting: Family Medicine

## 2019-05-25 DIAGNOSIS — F5101 Primary insomnia: Secondary | ICD-10-CM

## 2019-05-27 DIAGNOSIS — Z23 Encounter for immunization: Secondary | ICD-10-CM | POA: Diagnosis not present

## 2019-06-18 ENCOUNTER — Ambulatory Visit (INDEPENDENT_AMBULATORY_CARE_PROVIDER_SITE_OTHER): Payer: PPO | Admitting: *Deleted

## 2019-06-18 DIAGNOSIS — I4811 Longstanding persistent atrial fibrillation: Secondary | ICD-10-CM | POA: Diagnosis not present

## 2019-06-18 NOTE — Patient Instructions (Signed)
Visit Information  Goals Addressed            This Visit's Progress     Patient Stated   . Prescription Assistance (pt-stated)       Current Barriers:  Marland Kitchen Knowledge Deficits related to prescription assistance program  Nurse Case Manager Clinical Goal(s):  Marland Kitchen Over the next 30 days, patient will work with Consulting civil engineer to address needs related to Eliquis prescription assistance program  Interventions:  . Talked with patient regarding BMS patient assistance for Eliquis o She dropped off paperwork at [facility] a couple of weeks ago . Will collaborate with [facility] Clinical staff regarding patient's application . Collaborated with [facility] Clinical staff and had them place samples up front for patient pickup o Patient notified that samples are available for pickup . RN will f/u with patient over the next 30 days regarding application status  Patient Self Care Activities:  . Performs ADL's independently . Performs IADL's independently   Please see past updates related to this goal by clicking on the "Past Updates" button in the selected goal        The care management team will reach out to the patient again over the next 30 days.   Chong Sicilian, BSN, RN-BC Embedded Chronic Care Manager [facility] [place] Management Direct Dial: [phone]   The patient verbalized understanding of instructions provided today and declined a print copy of patient instruction materials.

## 2019-06-18 NOTE — Chronic Care Management (AMB) (Signed)
  Chronic Care Management   Follow Up Note   06/18/2019 Name: Rhonda [name] MRN: [id number] DOB: 05/04/1932  Referred by: Dettinger, Fransisca Kaufmann, MD Reason for referral : Chronic Care Management (RN follow up)   Rhonda [name] is a 86 y.o. year old female who is a primary care patient of Dettinger, Fransisca Kaufmann, MD. The CCM team was consulted for assistance with chronic disease management and care coordination needs.    Review of patient status, including review of consultants reports, relevant laboratory and other test results, and collaboration with appropriate care team members and the patient's provider was performed as part of comprehensive patient evaluation and provision of chronic care management services.     Outpatient Encounter Medications as of 06/18/2019  Medication Sig  . acetaminophen (TYLENOL) 650 MG CR tablet Take 1,300 mg by mouth every 8 (eight) hours as needed for pain.   . cholecalciferol (VITAMIN D3) 25 MCG (1000 UT) tablet Take 2,000 Units by mouth 2 (two) times daily.  Marland Kitchen diltiazem (CARDIZEM CD) 360 MG 24 hr capsule Take 1 capsule (360 mg total) by mouth daily.  Marland Kitchen ELIQUIS 5 MG TABS tablet TAKE ONE TABLET BY MOUTH TWICE DAILY  . flecainide (TAMBOCOR) 50 MG tablet Take 1 tablet (50 mg total) by mouth 2 (two) times daily.  Marland Kitchen zolpidem (AMBIEN) 5 MG tablet Take 1 tablet (5 mg total) by mouth at bedtime as needed for sleep.   No facility-administered encounter medications on file as of 06/18/2019.    RN Care Plan   . Prescription Assistance        Current Barriers:  Marland Kitchen Knowledge Deficits related to prescription assistance program  Nurse Case Manager Clinical Goal(s):  Marland Kitchen Over the next 30 days, patient will work with Consulting civil engineer to address needs related to Eliquis prescription assistance program  Interventions:  . Talked with patient regarding BMS patient assistance for Eliquis o She dropped off paperwork at [facility] a couple of weeks ago . Will collaborate with [facility]  Clinical staff regarding patient's application . Collaborated with [facility] Clinical staff and had them place samples up front for patient pickup o Patient notified that samples are available for pickup . RN will f/u with patient over the next 30 days regarding application status  Patient Self Care Activities:  . Performs ADL's independently . Performs IADL's independently   Please see past updates related to this goal by clicking on the "Past Updates" button in the selected goal          Plan:   The care management team will reach out to the patient again over the next 30 days.    Chong Sicilian, BSN, RN-BC Embedded Chronic Care Manager [facility] [place] Management Direct Dial: [phone]

## 2019-06-23 ENCOUNTER — Other Ambulatory Visit: Payer: Self-pay | Admitting: Family Medicine

## 2019-06-23 DIAGNOSIS — F5101 Primary insomnia: Secondary | ICD-10-CM

## 2019-06-24 ENCOUNTER — Ambulatory Visit: Payer: PPO | Admitting: Family Medicine

## 2019-06-25 ENCOUNTER — Telehealth: Payer: Self-pay | Admitting: Family Medicine

## 2019-06-30 DIAGNOSIS — M5441 Lumbago with sciatica, right side: Secondary | ICD-10-CM | POA: Diagnosis not present

## 2019-06-30 DIAGNOSIS — S233XXA Sprain of ligaments of thoracic spine, initial encounter: Secondary | ICD-10-CM | POA: Diagnosis not present

## 2019-06-30 DIAGNOSIS — M9902 Segmental and somatic dysfunction of thoracic region: Secondary | ICD-10-CM | POA: Diagnosis not present

## 2019-06-30 DIAGNOSIS — M47816 Spondylosis without myelopathy or radiculopathy, lumbar region: Secondary | ICD-10-CM | POA: Diagnosis not present

## 2019-06-30 DIAGNOSIS — S134XXA Sprain of ligaments of cervical spine, initial encounter: Secondary | ICD-10-CM | POA: Diagnosis not present

## 2019-06-30 DIAGNOSIS — M9903 Segmental and somatic dysfunction of lumbar region: Secondary | ICD-10-CM | POA: Diagnosis not present

## 2019-06-30 DIAGNOSIS — M9901 Segmental and somatic dysfunction of cervical region: Secondary | ICD-10-CM | POA: Diagnosis not present

## 2019-07-03 DIAGNOSIS — M9901 Segmental and somatic dysfunction of cervical region: Secondary | ICD-10-CM | POA: Diagnosis not present

## 2019-07-03 DIAGNOSIS — S134XXA Sprain of ligaments of cervical spine, initial encounter: Secondary | ICD-10-CM | POA: Diagnosis not present

## 2019-07-03 DIAGNOSIS — M5441 Lumbago with sciatica, right side: Secondary | ICD-10-CM | POA: Diagnosis not present

## 2019-07-03 DIAGNOSIS — M47816 Spondylosis without myelopathy or radiculopathy, lumbar region: Secondary | ICD-10-CM | POA: Diagnosis not present

## 2019-07-03 DIAGNOSIS — M9903 Segmental and somatic dysfunction of lumbar region: Secondary | ICD-10-CM | POA: Diagnosis not present

## 2019-07-03 DIAGNOSIS — S233XXA Sprain of ligaments of thoracic spine, initial encounter: Secondary | ICD-10-CM | POA: Diagnosis not present

## 2019-07-03 DIAGNOSIS — M9902 Segmental and somatic dysfunction of thoracic region: Secondary | ICD-10-CM | POA: Diagnosis not present

## 2019-07-06 DIAGNOSIS — M5441 Lumbago with sciatica, right side: Secondary | ICD-10-CM | POA: Diagnosis not present

## 2019-07-06 DIAGNOSIS — M47816 Spondylosis without myelopathy or radiculopathy, lumbar region: Secondary | ICD-10-CM | POA: Diagnosis not present

## 2019-07-06 DIAGNOSIS — M9902 Segmental and somatic dysfunction of thoracic region: Secondary | ICD-10-CM | POA: Diagnosis not present

## 2019-07-06 DIAGNOSIS — S233XXA Sprain of ligaments of thoracic spine, initial encounter: Secondary | ICD-10-CM | POA: Diagnosis not present

## 2019-07-06 DIAGNOSIS — M9903 Segmental and somatic dysfunction of lumbar region: Secondary | ICD-10-CM | POA: Diagnosis not present

## 2019-07-06 DIAGNOSIS — M9901 Segmental and somatic dysfunction of cervical region: Secondary | ICD-10-CM | POA: Diagnosis not present

## 2019-07-06 DIAGNOSIS — S134XXA Sprain of ligaments of cervical spine, initial encounter: Secondary | ICD-10-CM | POA: Diagnosis not present

## 2019-07-08 DIAGNOSIS — M47816 Spondylosis without myelopathy or radiculopathy, lumbar region: Secondary | ICD-10-CM | POA: Diagnosis not present

## 2019-07-08 DIAGNOSIS — M5441 Lumbago with sciatica, right side: Secondary | ICD-10-CM | POA: Diagnosis not present

## 2019-07-08 DIAGNOSIS — S134XXA Sprain of ligaments of cervical spine, initial encounter: Secondary | ICD-10-CM | POA: Diagnosis not present

## 2019-07-08 DIAGNOSIS — M9902 Segmental and somatic dysfunction of thoracic region: Secondary | ICD-10-CM | POA: Diagnosis not present

## 2019-07-08 DIAGNOSIS — S233XXA Sprain of ligaments of thoracic spine, initial encounter: Secondary | ICD-10-CM | POA: Diagnosis not present

## 2019-07-08 DIAGNOSIS — M9901 Segmental and somatic dysfunction of cervical region: Secondary | ICD-10-CM | POA: Diagnosis not present

## 2019-07-08 DIAGNOSIS — M9903 Segmental and somatic dysfunction of lumbar region: Secondary | ICD-10-CM | POA: Diagnosis not present

## 2019-07-13 DIAGNOSIS — M9903 Segmental and somatic dysfunction of lumbar region: Secondary | ICD-10-CM | POA: Diagnosis not present

## 2019-07-13 DIAGNOSIS — M9902 Segmental and somatic dysfunction of thoracic region: Secondary | ICD-10-CM | POA: Diagnosis not present

## 2019-07-13 DIAGNOSIS — S134XXA Sprain of ligaments of cervical spine, initial encounter: Secondary | ICD-10-CM | POA: Diagnosis not present

## 2019-07-13 DIAGNOSIS — M47816 Spondylosis without myelopathy or radiculopathy, lumbar region: Secondary | ICD-10-CM | POA: Diagnosis not present

## 2019-07-13 DIAGNOSIS — M9901 Segmental and somatic dysfunction of cervical region: Secondary | ICD-10-CM | POA: Diagnosis not present

## 2019-07-13 DIAGNOSIS — M5441 Lumbago with sciatica, right side: Secondary | ICD-10-CM | POA: Diagnosis not present

## 2019-07-13 DIAGNOSIS — S233XXA Sprain of ligaments of thoracic spine, initial encounter: Secondary | ICD-10-CM | POA: Diagnosis not present

## 2019-07-16 ENCOUNTER — Ambulatory Visit: Payer: PPO | Admitting: Family Medicine

## 2019-07-17 DIAGNOSIS — M47816 Spondylosis without myelopathy or radiculopathy, lumbar region: Secondary | ICD-10-CM | POA: Diagnosis not present

## 2019-07-17 DIAGNOSIS — S233XXA Sprain of ligaments of thoracic spine, initial encounter: Secondary | ICD-10-CM | POA: Diagnosis not present

## 2019-07-17 DIAGNOSIS — M9903 Segmental and somatic dysfunction of lumbar region: Secondary | ICD-10-CM | POA: Diagnosis not present

## 2019-07-17 DIAGNOSIS — M9902 Segmental and somatic dysfunction of thoracic region: Secondary | ICD-10-CM | POA: Diagnosis not present

## 2019-07-17 DIAGNOSIS — M5441 Lumbago with sciatica, right side: Secondary | ICD-10-CM | POA: Diagnosis not present

## 2019-07-17 DIAGNOSIS — S134XXA Sprain of ligaments of cervical spine, initial encounter: Secondary | ICD-10-CM | POA: Diagnosis not present

## 2019-07-17 DIAGNOSIS — M9901 Segmental and somatic dysfunction of cervical region: Secondary | ICD-10-CM | POA: Diagnosis not present

## 2019-07-20 DIAGNOSIS — M25561 Pain in right knee: Secondary | ICD-10-CM | POA: Diagnosis not present

## 2019-07-22 ENCOUNTER — Ambulatory Visit: Payer: PPO | Admitting: *Deleted

## 2019-07-22 DIAGNOSIS — I4811 Longstanding persistent atrial fibrillation: Secondary | ICD-10-CM

## 2019-07-22 NOTE — Patient Instructions (Signed)
Visit Information  Goals Addressed            This Visit's Progress     Patient Stated   . Prescription Assistance (pt-stated)       Current Barriers:  Marland Kitchen Knowledge Deficits related to prescription assistance program  Nurse Case Manager Clinical Goal(s):  Marland Kitchen Over the next 30 days, patient will work with Consulting civil engineer to address needs related to Eliquis prescription assistance program  Interventions:  . Talked with patient regarding BMS patient assistance for Eliquis o She dropped off paperwork at [facility] a couple of weeks ago . Chart reviewed. No updated BMS documentation in media.  . Previously collaborated with [facility] Clinical staff regarding patient's application . Reached out to [facility] clinical staff regarding status of application. Awaiting response.  Nash Dimmer with [facility] clinical staff to provide samples of Eliquis to patient when they become available. None in stock at this time.   Patient Self Care Activities:  . Performs ADL's independently . Performs IADL's independently   Please see past updates related to this goal by clicking on the "Past Updates" button in the selected goal         The patient verbalized understanding of instructions provided today and declined a print copy of patient instruction materials.   Follow-up Plan The care management team will reach out to the patient again over the next 5 days.   Chong Sicilian, BSN, RN-BC Embedded Chronic Care Manager [facility] [place] Management Direct Dial: [phone]

## 2019-07-22 NOTE — Chronic Care Management (AMB) (Signed)
  Chronic Care Management   Follow Up Note   07/22/2019 Name: Rhonda [name] MRN: [id number] DOB: 03-Dec-1932  Referred by: Dettinger, Fransisca Kaufmann, MD Reason for referral : Chronic Care Management (RN Follow up)   Rhonda [name] is a 86 y.o. year old female who is a primary care patient of Dettinger, Fransisca Kaufmann, MD. The CCM team was consulted for assistance with chronic disease management and care coordination needs.    Review of patient status, including review of consultants reports, relevant laboratory and other test results, and collaboration with appropriate care team members and the patient's provider was performed as part of comprehensive patient evaluation and provision of chronic care management services.    SDOH (Social Determinants of Health) assessments performed: No See Care Plan activities for detailed interventions related to [facility])     Outpatient Encounter Medications as of 07/22/2019  Medication Sig  . acetaminophen (TYLENOL) 650 MG CR tablet Take 1,300 mg by mouth every 8 (eight) hours as needed for pain.   . cholecalciferol (VITAMIN D3) 25 MCG (1000 UT) tablet Take 2,000 Units by mouth 2 (two) times daily.  Marland Kitchen diltiazem (CARDIZEM CD) 360 MG 24 hr capsule Take 1 capsule (360 mg total) by mouth daily.  Marland Kitchen ELIQUIS 5 MG TABS tablet TAKE ONE TABLET BY MOUTH TWICE DAILY  . flecainide (TAMBOCOR) 50 MG tablet Take 1 tablet (50 mg total) by mouth 2 (two) times daily.  Marland Kitchen zolpidem (AMBIEN) 5 MG tablet Take 1 tablet (5 mg total) by mouth at bedtime as needed for sleep.   No facility-administered encounter medications on file as of 07/22/2019.     Objective:   Goals Addressed            This Visit's Progress     Patient Stated   . Prescription Assistance (pt-stated)       Current Barriers:  Marland Kitchen Knowledge Deficits related to prescription assistance program  Nurse Case Manager Clinical Goal(s):  Marland Kitchen Over the next 30 days, patient will work with Consulting civil engineer to address  needs related to Eliquis prescription assistance program  Interventions:  . Talked with patient regarding BMS patient assistance for Eliquis o She dropped off paperwork at [facility] a couple of weeks ago . Chart reviewed. No updated BMS documentation in media.  . Previously collaborated with [facility] Clinical staff regarding patient's application . Reached out to [facility] clinical staff regarding status of application. Awaiting response.  Nash Dimmer with [facility] clinical staff to provide samples of Eliquis to patient when they become available. None in stock at this time.   Patient Self Care Activities:  . Performs ADL's independently . Performs IADL's independently   Please see past updates related to this goal by clicking on the "Past Updates" button in the selected goal          Plan:   The care management team will reach out to the patient again over the next 5 days.    Chong Sicilian, BSN, RN-BC Embedded Chronic Care Manager [facility] [place] Management Direct Dial: [phone]

## 2019-07-27 ENCOUNTER — Ambulatory Visit: Payer: PPO | Admitting: *Deleted

## 2019-07-27 DIAGNOSIS — I4811 Longstanding persistent atrial fibrillation: Secondary | ICD-10-CM

## 2019-07-27 NOTE — Patient Instructions (Signed)
Visit Information  Goals Addressed            This Visit's Progress     Patient Stated   . Prescription Assistance (pt-stated)       Current Barriers:  Marland Kitchen Knowledge Deficits related to prescription assistance program  Nurse Case Manager Clinical Goal(s):  Marland Kitchen Over the next 30 days, patient will work with Consulting civil engineer to address needs related to Eliquis prescription assistance program  Interventions:  . Previously talked with patient regarding BMS patient assistance for Eliquis o She dropped off paperwork at [facility] a couple of weeks ago . Chart reviewed. No updated BMS documentation in media.  . Previously collaborated with [facility] Clinical staff regarding patient's application . Previously reached out to [facility] clinical staff regarding status of application. Awaiting response.  Nash Dimmer with [facility] clinical staff to provide samples of Eliquis to patient when they become available. Samples were requested last week but have not come in yet.  . I will check back with the office tomorrow to see if samples have arrived.  . I will let patient know if samples are available and it not she will fill her prescription  Patient Self Care Activities:  . Performs ADL's independently . Performs IADL's independently   Please see past updates related to this goal by clicking on the "Past Updates" button in the selected goal         The patient verbalized understanding of instructions provided today and declined a print copy of patient instruction materials.   Follow-up Plan The care management team will reach out to the patient again over the next 2 days.   Chong Sicilian, BSN, RN-BC Embedded Chronic Care Manager [facility] [place] Management Direct Dial: [phone]

## 2019-07-27 NOTE — Chronic Care Management (AMB) (Signed)
  Chronic Care Management   Follow Up Note   07/27/2019 Name: Rhonda [name] MRN: [id number] DOB: 1932-07-23  Referred by: Dettinger, Fransisca Kaufmann, MD Reason for referral : Chronic Care Management (RN Follow up)   Rhonda [name] is a 86 y.o. year old female who is a primary care patient of Dettinger, Fransisca Kaufmann, MD. The CCM team was consulted for assistance with chronic disease management and care coordination needs.    Review of patient status, including review of consultants reports, relevant laboratory and other test results, and collaboration with appropriate care team members and the patient's provider was performed as part of comprehensive patient evaluation and provision of chronic care management services.    SDOH (Social Determinants of Health) assessments performed: No See Care Plan activities for detailed interventions related to [facility])     Outpatient Encounter Medications as of 07/27/2019  Medication Sig  . acetaminophen (TYLENOL) 650 MG CR tablet Take 1,300 mg by mouth every 8 (eight) hours as needed for pain.   . cholecalciferol (VITAMIN D3) 25 MCG (1000 UT) tablet Take 2,000 Units by mouth 2 (two) times daily.  Marland Kitchen diltiazem (CARDIZEM CD) 360 MG 24 hr capsule Take 1 capsule (360 mg total) by mouth daily.  Marland Kitchen ELIQUIS 5 MG TABS tablet TAKE ONE TABLET BY MOUTH TWICE DAILY  . flecainide (TAMBOCOR) 50 MG tablet Take 1 tablet (50 mg total) by mouth 2 (two) times daily.  Marland Kitchen zolpidem (AMBIEN) 5 MG tablet Take 1 tablet (5 mg total) by mouth at bedtime as needed for sleep.   No facility-administered encounter medications on file as of 07/27/2019.     RN Care Plan   . Prescription Assistance (pt-stated)       Current Barriers:  Marland Kitchen Knowledge Deficits related to prescription assistance program  Nurse Case Manager Clinical Goal(s):  Marland Kitchen Over the next 30 days, patient will work with Consulting civil engineer to address needs related to Eliquis prescription assistance program  Interventions:   . Previously talked with patient regarding BMS patient assistance for Eliquis o She dropped off paperwork at [facility] a couple of weeks ago . Chart reviewed. No updated BMS documentation in media.  . Previously collaborated with [name] Clinical staff regarding patient's application . Previously reached out to [name] clinical staff regarding status of application. Awaiting response.  Rhonda [name] with [facility] clinical staff to provide samples of Eliquis to patient when they become available. Samples were requested last week but have not come in yet.  . I will check back with the office tomorrow to see if samples have arrived.  . I will let patient know if samples are available and it not she will fill her prescription  Patient Self Care Activities:  . Performs ADL's independently . Performs IADL's independently   Please see past updates related to this goal by clicking on the "Past Updates" button in the selected goal          Plan:   The care management team will reach out to the patient again over the next 2 days.    Rhonda [name], BSN, RN-BC Embedded Chronic Care Manager [facility] [place] Management Direct Dial: [phone]

## 2019-07-29 ENCOUNTER — Other Ambulatory Visit: Payer: Self-pay | Admitting: Cardiology

## 2019-08-06 ENCOUNTER — Other Ambulatory Visit: Payer: Self-pay | Admitting: Family Medicine

## 2019-08-06 DIAGNOSIS — I4811 Longstanding persistent atrial fibrillation: Secondary | ICD-10-CM

## 2019-08-21 DIAGNOSIS — M1711 Unilateral primary osteoarthritis, right knee: Secondary | ICD-10-CM | POA: Diagnosis not present

## 2019-08-26 ENCOUNTER — Telehealth: Payer: Self-pay | Admitting: Cardiology

## 2019-08-26 NOTE — Telephone Encounter (Signed)
   [facility] Pre-operative Risk Assessment    Request for surgical clearance:  1. What type of surgery is being performed? Right total knee arthoplasty    2. When is this surgery scheduled? October 12, 2019   3. What type of clearance is required (medical clearance vs. Pharmacy clearance to hold med vs. Both)? Both   4. Are there any medications that need to be held prior to surgery and how long? Eliquis   5. Practice name and name of physician performing surgery? Dr. Hector Shade with [facility]   6. What is your office phone number [phone]     7.   What is your office fax number [phone] Attn: Glendale Chard  8.   Anesthesia type (None, local, MAC, general) ?  Choice    Sheral Apley M 08/26/2019, 2:27 PM  _________________________________________________________________   (provider comments below)

## 2019-08-27 ENCOUNTER — Other Ambulatory Visit: Payer: Self-pay | Admitting: Cardiology

## 2019-08-27 NOTE — Telephone Encounter (Signed)
Pharmacy, please comment regarding holding Eliquis prior to procedure and route response to P CV DIV PREOP, thanks!

## 2019-08-27 NOTE — Telephone Encounter (Signed)
Patient with diagnosis of afib on Eliquis for anticoagulation.    Procedure: Right total knee arthoplasty   Date of procedure: 10/12/19  CHADS2-VASc score of  4 (HTN, AGE,AGE, female)  CrCl 49 ml/min  Per office protocol, patient can hold Eliquis for 3 days prior to procedure.

## 2019-08-27 NOTE — Telephone Encounter (Signed)
Attempted to call patient, VM box is full. Unable to leave message.

## 2019-08-28 ENCOUNTER — Encounter: Payer: Self-pay | Admitting: Family Medicine

## 2019-08-28 ENCOUNTER — Ambulatory Visit (INDEPENDENT_AMBULATORY_CARE_PROVIDER_SITE_OTHER): Payer: PPO | Admitting: Family Medicine

## 2019-08-28 ENCOUNTER — Other Ambulatory Visit: Payer: Self-pay

## 2019-08-28 VITALS — BP 117/63 | HR 66 | Temp 97.4°F | Ht 65.0 in | Wt 169.4 lb

## 2019-08-28 DIAGNOSIS — I4811 Longstanding persistent atrial fibrillation: Secondary | ICD-10-CM | POA: Diagnosis not present

## 2019-08-28 DIAGNOSIS — Z01818 Encounter for other preprocedural examination: Secondary | ICD-10-CM

## 2019-08-28 DIAGNOSIS — F5101 Primary insomnia: Secondary | ICD-10-CM

## 2019-08-28 DIAGNOSIS — I10 Essential (primary) hypertension: Secondary | ICD-10-CM

## 2019-08-28 MED ORDER — ZOLPIDEM TARTRATE 5 MG PO TABS: 5.0000 mg | ORAL_TABLET | Freq: Every evening | ORAL | 5 refills | Status: DC | PRN

## 2019-08-28 NOTE — Progress Notes (Signed)
BP 117/63   Pulse 66   Temp (!) 97.4 F (36.3 C) (Temporal)   Ht _0  (1.651 m)   Wt 169 lb 6 oz (76.8 kg)   BMI 28.19 kg/m    Subjective:   Patient ID: Rhonda [name], female    DOB: December 23, 1932, 86 y.o.   MRN: [id number]  HPI: Rhonda [name] is a 86 y.o. female presenting on 08/28/2019 for Medical Management of Chronic Issues   HPI Patient is coming in for preoperative clearance, she is going next month for a right knee replacement with Dr. Reynaldo Minium.  She does have a cardiologist and Dr. Percival Spanish will give clearance for cardiac.  Hypertension Patient is currently on diltiazem and flecainide, and their blood pressure today is 117/63. Patient denies any lightheadedness or dizziness. Patient denies headaches, blurred vision, chest pains, shortness of breath, or weakness. Denies any side effects from medication and is content with current medication.   Insomnia Patient has insomnia and takes Ambien and does good on it. Current rx-Ambien 5 mg nightly as needed # meds rx-30 Effectiveness of current meds-works well Adverse reactions form meds-none  Pill count performed-No Last drug screen -N/A ( high risk q74m moderate risk q688mlow risk yearly ) Urine drug screen today- No, doing presurgical clearance will do at next visit [place]  If yes were their any concerning findings? -None  No flowsheet data found.   Controlled substance contract signed on: N/A  Patient has A. fib and sees cardiologist for this.  She is currently on flecainide and Eliquis and diltiazem to help control this and her heart rate is 66 today.  Relevant past medical, surgical, family and social history reviewed and updated as indicated. Interim medical history since our last visit reviewed. Allergies and medications reviewed and updated.  Review of Systems  Constitutional: Negative for chills and fever.  Eyes: Negative for visual disturbance.  Respiratory: Negative for chest  tightness and shortness of breath.   Cardiovascular: Negative for chest pain and leg swelling.  Musculoskeletal: Negative for back pain and gait problem.  Skin: Negative for rash.  Neurological: Negative for light-headedness and headaches.  Psychiatric/Behavioral: Positive for dysphoric mood and sleep disturbance. Negative for agitation, behavioral problems, self-injury and suicidal ideas. The patient is nervous/anxious.   All other systems reviewed and are negative.   Per HPI unless specifically indicated above   Allergies as of 08/28/2019   No Known Allergies     Medication List       Accurate as of Aug 28, 2019 11:14 AM. If you have any questions, ask your nurse or doctor.        acetaminophen 650 MG CR tablet Commonly known as: TYLENOL Take 1,300 mg by mouth every 8 (eight) hours as needed for pain.   cholecalciferol 25 MCG (1000 UNIT) tablet Commonly known as: VITAMIN D3 Take 2,000 Units by mouth 2 (two) times daily.   diltiazem 360 MG 24 hr capsule Commonly known as: CARDIZEM CD Take 1 capsule (360 mg total) by mouth daily.   Eliquis 5 MG Tabs tablet Generic drug: apixaban TAKE ONE TABLET BY MOUTH TWICE DAILY   flecainide 50 MG tablet Commonly known as: TAMBOCOR TAKE ONE TABLET BY MOUTH TWICE DAILY   zolpidem 5 MG tablet Commonly known as: AMBIEN Take 1 tablet (5 mg total) by mouth at bedtime as needed for sleep.        Objective:   BP 117/63   Pulse 66  Temp (!) 97.4 F (36.3 C) (Temporal)   Ht _0  (1.651 m)   Wt 169 lb 6 oz (76.8 kg)   BMI 28.19 kg/m   Wt Readings from Last 3 Encounters:  08/28/19 169 lb 6 oz (76.8 kg)  12/18/18 169 lb (76.7 kg)  12/17/18 168 lb (76.2 kg)    Physical Exam Vitals and nursing note reviewed.  Constitutional:      General: She is not in acute distress.    Appearance: She is well-developed. She is not diaphoretic.  Eyes:     Conjunctiva/sclera: Conjunctivae normal.  Cardiovascular:     Rate and Rhythm:  Normal rate and regular rhythm.     Heart sounds: Normal heart sounds. No murmur.  Pulmonary:     Effort: Pulmonary effort is normal. No respiratory distress.     Breath sounds: Normal breath sounds. No wheezing.  Musculoskeletal:        General: No tenderness. Normal range of motion.  Skin:    General: Skin is warm and dry.     Findings: No rash.  Neurological:     Mental Status: She is alert and oriented to person, place, and time.     Coordination: Coordination normal.  Psychiatric:        Behavior: Behavior normal.       Assessment & Plan:   Problem List Items Addressed This Visit      Cardiovascular and Mediastinum   Atrial fibrillation ([place])   Relevant Orders   CBC with Differential/Platelet   CMP14+EGFR   Essential hypertension   Relevant Orders   Lipid panel     Other   Insomnia   Relevant Medications   zolpidem (AMBIEN) 5 MG tablet    Other Visit Diagnoses    Preoperative clearance    -  Primary   Relevant Orders   CBC with Differential/Platelet   CMP14+EGFR   Lipid panel      Patient is coming in for preoperative clearance, she will see cardiology for heart clearance but other than that we will do some blood work and I think she will be cleared.  As long as the blood work comes back fine.  Continue current medication, no change. Follow up plan: Return in about 6 months (around 02/28/2020) for Hypertension and insomnia recheck.  Counseling provided for all of the vaccine components Orders Placed This Encounter  Procedures  . CBC with Differential/Platelet  . CMP14+EGFR  . Lipid panel    Caryl Pina, MD [facility] 08/28/2019, 11:14 AM

## 2019-08-29 LAB — CBC WITH DIFFERENTIAL/PLATELET
Basophils Absolute: 0.1 10*3/uL (ref 0.0–0.2)
Basos: 1 %
EOS (ABSOLUTE): 0.1 10*3/uL (ref 0.0–0.4)
Eos: 1 %
Hematocrit: 41.6 % (ref 34.0–46.6)
Hemoglobin: 14 g/dL (ref 11.1–15.9)
Immature Grans (Abs): 0 10*3/uL (ref 0.0–0.1)
Immature Granulocytes: 0 %
Lymphocytes Absolute: 1.2 10*3/uL (ref 0.7–3.1)
Lymphs: 20 %
MCH: 29.2 pg (ref 26.6–33.0)
MCHC: 33.7 g/dL (ref 31.5–35.7)
MCV: 87 fL (ref 79–97)
Monocytes Absolute: 0.5 10*3/uL (ref 0.1–0.9)
Monocytes: 7 %
Neutrophils Absolute: 4.3 10*3/uL (ref 1.4–7.0)
Neutrophils: 71 %
Platelets: 290 10*3/uL (ref 150–450)
RBC: 4.79 x10E6/uL (ref 3.77–5.28)
RDW: 13.2 % (ref 11.7–15.4)
WBC: 6.1 10*3/uL (ref 3.4–10.8)

## 2019-08-29 LAB — LIPID PANEL
Chol/HDL Ratio: 3.4 ratio (ref 0.0–4.4)
Cholesterol, Total: 179 mg/dL (ref 100–199)
HDL: 52 mg/dL (ref >39)
LDL Chol Calc (NIH): 103 mg/dL — ABNORMAL HIGH (ref 0–99)
Triglycerides: 136 mg/dL (ref 0–149)
VLDL Cholesterol Cal: 24 mg/dL (ref 5–40)

## 2019-08-29 LAB — CMP14+EGFR
ALT: 12 IU/L (ref 0–32)
AST: 18 IU/L (ref 0–40)
Albumin/Globulin Ratio: 1.8 (ref 1.2–2.2)
Albumin: 4.3 g/dL (ref 3.6–4.6)
Alkaline Phosphatase: 102 IU/L (ref 39–117)
BUN/Creatinine Ratio: 19 (ref 12–28)
BUN: 17 mg/dL (ref 8–27)
Bilirubin Total: 0.3 mg/dL (ref 0.0–1.2)
CO2: 25 mmol/L (ref 20–29)
Calcium: 9.6 mg/dL (ref 8.7–10.3)
Chloride: 100 mmol/L (ref 96–106)
Creatinine, Ser: 0.9 mg/dL (ref 0.57–1.00)
GFR calc Af Amer: 68 mL/min/{1.73_m2} (ref >59)
GFR calc non Af Amer: 59 mL/min/{1.73_m2} — ABNORMAL LOW (ref >59)
Globulin, Total: 2.4 g/dL (ref 1.5–4.5)
Glucose: 95 mg/dL (ref 65–99)
Potassium: 4.4 mmol/L (ref 3.5–5.2)
Sodium: 139 mmol/L (ref 134–144)
Total Protein: 6.7 g/dL (ref 6.0–8.5)

## 2019-08-31 NOTE — Telephone Encounter (Signed)
   Primary Cardiologist: [name], MD  Chart reviewed as part of pre-operative protocol coverage. Patient was contacted 08/31/2019 in reference to pre-operative risk assessment for pending surgery as outlined below.  Rhonda [name] was last seen on 12/17/18 by Dr. Percival Spanish. H/o persistent AF, HTN, SVT, arthritis. ETT 05/2018 for flecainide was considered a normal study, exercised 87min 56sec (4.4METS), patient cites knee problems as the primary reason for not going longer, no cardiac symptoms. No known hx CAD. She is able to go up and down steps without angina/dyspnea. "I feel great otherwise; I can't complain," she states eagerly. Therefore, based on ACC/AHA guidelines, the patient would be at acceptable risk for the planned procedure without further cardiovascular testing. Pt is aware to notify if anything changes in how she is feeling between now and surgery.  Per pharmacist recommendation per office protocol, patient can hold Eliquis for 3 days prior to procedure.  I will route this recommendation to the requesting party via Epic fax function and remove from pre-op pool.  Please call with questions.  Charlie Pitter, PA-C 08/31/2019, 11:53 AM

## 2019-09-03 ENCOUNTER — Telehealth: Payer: Self-pay | Admitting: Family Medicine

## 2019-09-03 NOTE — Telephone Encounter (Signed)
Patient aware to come sign form on Dettingers nurse desk and then he will sign and we can send back.

## 2019-09-03 NOTE — Telephone Encounter (Signed)
Rhonda [name] called from [facility] regarding paperwork we faxed to them for assistance with Eliquis. Says they did not receive all of the paperwork. Missing is patient consent form and outpatient questionaire on providers application. Can fax those documents to [phone]

## 2019-09-25 ENCOUNTER — Encounter (HOSPITAL_COMMUNITY): Payer: Self-pay

## 2019-09-25 NOTE — Progress Notes (Signed)
PCP - Vonna Kotyk Dettinger clearance on chart 08-28-19  Cardiologist - [name] clearance on chart 08-26-19   PPM/ICD -  Device Orders -  Rep Notified -   Chest x-ray - 10-18-18 epic EKG - 10-18-18 epic Stress Test - 06-05-18 epic ECHO - 05-20-18 epic Cardiac Cath -   Sleep Study -  CPAP -   Fasting Blood Sugar -  Checks Blood Sugar _____ times a day  Blood Thinner Instructions:Elequis stop 2 days prior to surgeryAspirin Instructions:  ERAS Protcol - PRE-SURGERY Ensure or G2-   COVID TEST-    Anesthesia review: afib svt htn  Patient denies shortness of breath, fever, cough and chest pain at PAT appointment  none   All instructions explained to the patient, with a verbal understanding of the material. Patient agrees to go over the instructions while at home for a better understanding. Patient also instructed to self quarantine after being tested for COVID-19. The opportunity to ask questions was provided.

## 2019-09-25 NOTE — Patient Instructions (Addendum)
DUE TO COVID-19 ONLY ONE VISITOR IS ALLOWED TO COME WITH YOU AND STAY IN THE WAITING ROOM ONLY DURING PRE OP AND PROCEDURE DAY OF SURGERY. Two  VISITOR MAY VISIT WITH YOU AFTER SURGERY IN YOUR PRIVATE ROOM DURING VISITING HOURS ONLY!10-a8p  YOU NEED TO HAVE A COVID 19 TEST ON_6-17-21______ @_10 :00______, THIS TEST MUST BE DONE BEFORE SURGERY, COME  [address] [place] , [id number].  ([place]) ONCE YOUR COVID TEST IS COMPLETED, PLEASE BEGIN THE QUARANTINE INSTRUCTIONS AS OUTLINED IN YOUR HANDOUT.                Rhonda [name]  09/25/2019   Your procedure is scheduled on:  10-12-19   Report to [facility] Main  Entrance   Report to admitting at       1000 AM     Call this number if you have problems the morning of surgery [phone]    Remember: NO SOLID FOOD AFTER MIDNIGHT THE NIGHT PRIOR TO SURGERY. NOTHING BY MOUTH EXCEPT CLEAR LIQUIDS UNTIL  0930 am . PLEASE FINISH ENSURE DRINK PER SURGEON ORDER  WHICH NEEDS TO BE COMPLETED AT     0930 am then nothing by mouth.    CLEAR LIQUID DIET   Foods Allowed                                                                             Foods Excluded  Coffee and tea, regular and decaf no creamer                            liquids that you cannot  Plain Jell-O any favor except red or purple                                           see through such as: Fruit ices (not with fruit pulp)                                                    milk, soups, orange juice  Iced Popsicles                                                      All solid food Carbonated beverages, regular and diet                                    Cranberry, grape and apple juices Sports drinks like Gatorade Lightly seasoned clear broth or consume(fat free) Sugar, honey syrup  _____________________________________________________________________    BRUSH YOUR TEETH MORNING OF SURGERY AND RINSE YOUR MOUTH OUT, NO CHEWING GUM CANDY  OR MINTS.     Take  these medicines the morning of surgery with A SIP OF WATER: flecainide, dilitiazem, tylenol if needed                               You may not have any metal on your body including hair pins and              piercings  Do not wear jewelry, make-up, lotions, powders or perfumes, deodorant             Do not wear nail polish on your fingernails.  Do not shave  48 hours prior to surgery.      Do not bring valuables to the hospital. [place].  Contacts, dentures or bridgework may not be worn into surgery.      Patients discharged the day of surgery will not be allowed to drive home. IF YOU ARE HAVING SURGERY AND GOING HOME THE SAME DAY, YOU MUST HAVE AN ADULT TO DRIVE YOU HOME AND BE WITH YOU FOR 24 HOURS. YOU MAY GO HOME BY TAXI OR UBER OR ORTHERWISE, BUT AN ADULT MUST ACCOMPANY YOU HOME AND STAY WITH YOU FOR 24 HOURS.  Name and phone number of your driver:  Special Instructions: N/A              Please read over the following fact sheets you were given: _____________________________________________________________________             [facility] - Preparing for Surgery Before surgery, you can play an important role.  Because skin is not sterile, your skin needs to be as free of germs as possible.  You can reduce the number of germs on your skin by washing with CHG (chlorahexidine gluconate) soap before surgery.  CHG is an antiseptic cleaner which kills germs and bonds with the skin to continue killing germs even after washing. Please DO NOT use if you have an allergy to CHG or antibacterial soaps.  If your skin becomes reddened/irritated stop using the CHG and inform your nurse when you arrive at Short Stay. Do not shave (including legs and underarms) for at least 48 hours prior to the first CHG shower.  You may shave your face/neck. Please follow these instructions carefully:  1.  Shower with CHG Soap the night before  surgery and the  morning of Surgery.  2.  If you choose to wash your hair, wash your hair first as usual with your  normal  shampoo.  3.  After you shampoo, rinse your hair and body thoroughly to remove the  shampoo.                           4.  Use CHG as you would any other liquid soap.  You can apply chg directly  to the skin and wash                       Gently with a scrungie or clean washcloth.  5.  Apply the CHG Soap to your body ONLY FROM THE NECK DOWN.   Do not use on face/ open                           Wound or open sores. Avoid contact  with eyes, ears mouth and genitals (private parts).                       Wash face,  Genitals (private parts) with your normal soap.             6.  Wash thoroughly, paying special attention to the area where your surgery  will be performed.  7.  Thoroughly rinse your body with warm water from the neck down.  8.  DO NOT shower/wash with your normal soap after using and rinsing off  the CHG Soap.                9.  Pat yourself dry with a clean towel.            10.  Wear clean pajamas.            11.  Place clean sheets on your bed the night of your first shower and do not  sleep with pets. Day of Surgery : Do not apply any lotions/deodorants the morning of surgery.  Please wear clean clothes to the hospital/surgery center.  FAILURE TO FOLLOW THESE INSTRUCTIONS MAY RESULT IN THE CANCELLATION OF YOUR SURGERY PATIENT SIGNATURE_________________________________  NURSE SIGNATURE__________________________________  ________________________________________________________________________   Rhonda [name]  An incentive spirometer is a tool that can help keep your lungs clear and active. This tool measures how well you are filling your lungs with each breath. Taking long deep breaths may help reverse or decrease the chance of developing breathing (pulmonary) problems (especially infection) following:  A long period of time when you are unable to  move or be active. BEFORE THE PROCEDURE   If the spirometer includes an indicator to show your best effort, your nurse or respiratory therapist will set it to a desired goal.  If possible, sit up straight or lean slightly forward. Try not to slouch.  Hold the incentive spirometer in an upright position. INSTRUCTIONS FOR USE  1. Sit on the edge of your bed if possible, or sit up as far as you can in bed or on a chair. 2. Hold the incentive spirometer in an upright position. 3. Breathe out normally. 4. Place the mouthpiece in your mouth and seal your lips tightly around it. 5. Breathe in slowly and as deeply as possible, raising the piston or the ball toward the top of the column. 6. Hold your breath for 3-5 seconds or for as long as possible. Allow the piston or ball to fall to the bottom of the column. 7. Remove the mouthpiece from your mouth and breathe out normally. 8. Rest for a few seconds and repeat Steps 1 through 7 at least 10 times every 1-2 hours when you are awake. Take your time and take a few normal breaths between deep breaths. 9. The spirometer may include an indicator to show your best effort. Use the indicator as a goal to work toward during each repetition. 10. After each set of 10 deep breaths, practice coughing to be sure your lungs are clear. If you have an incision (the cut made at the time of surgery), support your incision when coughing by placing a pillow or rolled up towels firmly against it. Once you are able to get out of bed, walk around indoors and cough well. You may stop using the incentive spirometer when instructed by your caregiver.  RISKS AND COMPLICATIONS  Take your time so you do not get dizzy or light-headed.  If you  are in pain, you may need to take or ask for pain medication before doing incentive spirometry. It is harder to take a deep breath if you are having pain. AFTER USE  Rest and breathe slowly and easily.  It can be helpful to keep track of  a log of your progress. Your caregiver can provide you with a simple table to help with this. If you are using the spirometer at home, follow these instructions: [place] IF:   You are having difficultly using the spirometer.  You have trouble using the spirometer as often as instructed.  Your pain medication is not giving enough relief while using the spirometer.  You develop fever of 100.5 F (38.1 C) or higher. SEEK IMMEDIATE MEDICAL CARE IF:   You cough up bloody sputum that had not been present before.  You develop fever of 102 F (38.9 C) or greater.  You develop worsening pain at or near the incision site. MAKE SURE YOU:   Understand these instructions.  Will watch your condition.  Will get help right away if you are not doing well or get worse. Document Released: 08/20/2006 Document Revised: 07/02/2011 Document Reviewed: 10/21/2006 [facility] Patient Information 2014 [place], [place].   ________________________________________________________________________

## 2019-09-28 ENCOUNTER — Encounter (HOSPITAL_COMMUNITY): Payer: Self-pay

## 2019-09-28 ENCOUNTER — Encounter (HOSPITAL_COMMUNITY)
Admission: RE | Admit: 2019-09-28 | Discharge: 2019-09-28 | Disposition: A | Discharge status: Home / Self Care | Payer: PPO | Source: Ambulatory Visit | Attending: Orthopedic Surgery | Admitting: Orthopedic Surgery

## 2019-09-28 ENCOUNTER — Other Ambulatory Visit: Payer: Self-pay

## 2019-09-28 DIAGNOSIS — Z01818 Encounter for other preprocedural examination: Secondary | ICD-10-CM | POA: Insufficient documentation

## 2019-09-28 HISTORY — DX: Essential (primary) hypertension: I10

## 2019-09-28 HISTORY — DX: Cardiac arrhythmia, unspecified: I49.9

## 2019-10-05 ENCOUNTER — Other Ambulatory Visit: Payer: Self-pay

## 2019-10-05 ENCOUNTER — Encounter (HOSPITAL_COMMUNITY)
Admission: RE | Admit: 2019-10-05 | Discharge: 2019-10-05 | Disposition: A | Discharge status: Home / Self Care | Payer: PPO | Source: Ambulatory Visit | Attending: Orthopedic Surgery | Admitting: Orthopedic Surgery

## 2019-10-05 DIAGNOSIS — Z01818 Encounter for other preprocedural examination: Secondary | ICD-10-CM | POA: Insufficient documentation

## 2019-10-05 LAB — COMPREHENSIVE METABOLIC PANEL
ALT: 12 U/L (ref 0–44)
AST: 15 U/L (ref 15–41)
Albumin: 4.1 g/dL (ref 3.5–5.0)
Alkaline Phosphatase: 80 U/L (ref 38–126)
Anion gap: 10 (ref 5–15)
BUN: 24 mg/dL — ABNORMAL HIGH (ref 8–23)
CO2: 26 mmol/L (ref 22–32)
Calcium: 9 mg/dL (ref 8.9–10.3)
Chloride: 101 mmol/L (ref 98–111)
Creatinine, Ser: 0.91 mg/dL (ref 0.44–1.00)
GFR calc Af Amer: >60 mL/min (ref >60)
GFR calc non Af Amer: 58 mL/min — ABNORMAL LOW (ref >60)
Glucose, Bld: 91 mg/dL (ref 70–99)
Potassium: 4.5 mmol/L (ref 3.5–5.1)
Sodium: 137 mmol/L (ref 135–145)
Total Bilirubin: 0.6 mg/dL (ref 0.3–1.2)
Total Protein: 7.4 g/dL (ref 6.5–8.1)

## 2019-10-05 LAB — SURGICAL PCR SCREEN
MRSA, PCR: NEGATIVE
Staphylococcus aureus: NEGATIVE

## 2019-10-05 LAB — APTT: aPTT: 31 seconds (ref 24–36)

## 2019-10-05 LAB — CBC
HCT: 44.7 % (ref 36.0–46.0)
Hemoglobin: 13.7 g/dL (ref 12.0–15.0)
MCH: 29.2 pg (ref 26.0–34.0)
MCHC: 30.6 g/dL (ref 30.0–36.0)
MCV: 95.3 fL (ref 80.0–100.0)
Platelets: 278 10*3/uL (ref 150–400)
RBC: 4.69 MIL/uL (ref 3.87–5.11)
RDW: 14.2 % (ref 11.5–15.5)
WBC: 7 10*3/uL (ref 4.0–10.5)
nRBC: 0 % (ref 0.0–0.2)

## 2019-10-05 LAB — PROTIME-INR
INR: 1.1 (ref 0.8–1.2)
Prothrombin Time: 13.8 seconds (ref 11.4–15.2)

## 2019-10-05 LAB — ABO/RH: ABO/RH(D): O POS

## 2019-10-06 NOTE — Progress Notes (Signed)
Anesthesia Chart Review   Case: [id number] Date/Time: 10/12/19 1215   Procedure: TOTAL KNEE ARTHROPLASTY (Right Knee) - 63min   Anesthesia type: Choice   Pre-op diagnosis: right knee osteoarthritis   Location: [facility] ROOM 10 / [facility]   Surgeons: Gaynelle Arabian, MD      DISCUSSION:86 y.o. never smoker with h/o atrial fibrillation (on Eliquis), HTN, right knee OA scheduled for above procedure 10/12/2019 with Dr. Gaynelle Arabian.   Per cardiology pre-operative risk assessment 08/31/2019, "Chart reviewed as part of pre-operative protocol coverage. Patient was contacted 08/31/2019 in reference to pre-operative risk assessment for pending surgery as outlined below.  Rhonda [name] was last seen on 12/17/18 by Dr. Percival Spanish. H/o persistent AF, HTN, SVT, arthritis. ETT 05/2018 for flecainide was considered a normal study, exercised 49min 56sec (4.4METS), patient cites knee problems as the primary reason for not going longer, no cardiac symptoms. No known hx CAD. She is able to go up and down steps without angina/dyspnea. "I feel great otherwise; I can't complain," she states eagerly. Therefore, based on ACC/AHA guidelines, the patient would be at acceptable risk for the planned procedure without further cardiovascular testing. Pt is aware to notify if anything changes in how she is feeling between now and surgery. Per pharmacist recommendation per office protocol, patient can holdEliquisfor 3days prior to procedure."  Pt reports last dose will be 10/09/2019.    Anticipate pt can proceed with planned procedure barring acute status change.   VS: BP 138/74   Pulse 72   Temp 36.8 C (Oral)   Resp 18   Ht 5\' 5"  (1.651 m)   Wt 77.6 kg   SpO2 99%   BMI 28.48 kg/m   PROVIDERS: Dettinger, Fransisca Kaufmann, MD PCP  [name], MD is Cardiologist  LABS: Labs reviewed: Acceptable for surgery. (all labs ordered are listed, but only abnormal results are displayed)  Labs Reviewed  COMPREHENSIVE METABOLIC PANEL -  Abnormal; Notable for the following components:      Result Value   BUN 24 (*)    GFR calc non Af Amer 58 (*)    All other components within normal limits  SURGICAL PCR SCREEN  APTT  CBC  PROTIME-INR  TYPE AND SCREEN  ABO/RH     IMAGES:   EKG: 10/05/2019 Rate 73 bpm Normal sinus rhythm Left axis deviation Abnormal ECG When compared with ECG of 10/18/2018, Left axis deviation , new   CV: ETT 06/05/2018  Patient exercised for 1 minute and 56 seconds increasing her heart rate from 73-101. 73% of predicted value  No chest discomfort, no ischemic changes noted. No adverse arrhythmias. No QRS widening.  Normal exercise treadmill test post flecanide.  Echo 05/20/2018 IMPRESSIONS   1. Patient was in atrial fibrillation with RVR throughout the study, with  rates between 110-140 bpm. This limits the sensitivity of the study for  diagnostic wall motion abnormalities.  2. The left ventricle appears to be normal in size, has normal wall  thickness 50-55% ejection fraction Spectral Doppler shows NA pattern of  diastolic filling.  3. Right ventricular systolic pressure is is normal.  4. The right ventricle has normal size and normal systolic function.  5. Severely dilated left atrial size.  6. Moderately dilated right atrial size.  7. Mitral valve regurgitation is trivial by color flow Doppler.  8. The mitral valve normal in structure and function.  9. Normal tricuspid valve.  10. Tricuspid regurgitation moderate.  11. Aortic valve normal.  12. Mild dilatation of  the aortic root.  13. No atrial level shunt detected by color flow Doppler.  Past Medical History:  Diagnosis Date  . Arthritis   . Atrial fibrillation ([place]) 05/2015  . Dysrhythmia    afib  . Hypertension   . Osteopenia   . Syncope 04/2014    Past Surgical History:  Procedure Laterality Date  . BREAST EXCISIONAL BIOPSY Left   . BREAST EXCISIONAL BIOPSY Right   . COLONOSCOPY  2011  . KNEE  ARTHROCENTESIS Bilateral   . KNEE ARTHROSCOPY     bil  . POLYPECTOMY  11-01-2009  . ROTATOR CUFF REPAIR    . SHOULDER SURGERY Left   . TOTAL KNEE ARTHROPLASTY Left   . VAGINAL HYSTERECTOMY     twice - vaginal and then oopherectomy    MEDICATIONS: . acetaminophen (TYLENOL) 650 MG CR tablet  . cholecalciferol (VITAMIN D3) 25 MCG (1000 UT) tablet  . diltiazem (CARDIZEM CD) 360 MG 24 hr capsule  . ELIQUIS 5 MG TABS tablet  . flecainide (TAMBOCOR) 50 MG tablet  . zolpidem (AMBIEN) 5 MG tablet   No current facility-administered medications for this encounter.    Rhonda [name] [facility] Pre-Surgical Testing [phone] 10/06/19  10:28 AM

## 2019-10-06 NOTE — Anesthesia Preprocedure Evaluation (Addendum)
Anesthesia Evaluation  Patient identified by MRN, date of birth, ID band Patient awake    Reviewed: Allergy & Precautions, NPO status , Patient's Chart, lab work & pertinent test results  Airway Mallampati: II  TM Distance: >3 FB Neck ROM: Full    Dental no notable dental hx.    Pulmonary neg pulmonary ROS,    Pulmonary exam normal breath sounds clear to auscultation       Cardiovascular hypertension, Pt. on medications Normal cardiovascular exam+ dysrhythmias Atrial Fibrillation  Rhythm:Regular Rate:Normal     Neuro/Psych negative neurological ROS  negative psych ROS   GI/Hepatic negative GI ROS, Neg liver ROS,   Endo/Other  negative endocrine ROS  Renal/GU negative Renal ROS  negative genitourinary   Musculoskeletal  (+) Arthritis , Osteoarthritis,    Abdominal   Peds negative pediatric ROS (+)  Hematology negative hematology ROS (+)   Anesthesia Other Findings   Reproductive/Obstetrics negative OB ROS                            Anesthesia Physical Anesthesia Plan  ASA: III  Anesthesia Plan: Spinal   Post-op Pain Management:  Regional for Post-op pain   Induction: Intravenous  PONV Risk Score and Plan: 2 and Ondansetron, Midazolam and Treatment may vary due to age or medical condition  Airway Management Planned: Simple Face Mask  Additional Equipment:   Intra-op Plan:   Post-operative Plan:   Informed Consent: I have reviewed the patients History and Physical, chart, labs and discussed the procedure including the risks, benefits and alternatives for the proposed anesthesia with the patient or authorized representative who has indicated his/her understanding and acceptance.     Dental advisory given  Plan Discussed with: CRNA  Anesthesia Plan Comments: (See PAT note 10/05/2019, Konrad Felix, PA-C)       Anesthesia Quick Evaluation

## 2019-10-07 NOTE — H&P (Signed)
TOTAL KNEE ADMISSION H&P  Patient is being admitted for right total knee arthroplasty.  Subjective:  Chief Complaint: Right knee pain.  HPI: Rhonda [name], 86 y.o. female has a history of pain and functional disability in the right knee due to arthritis and has failed non-surgical conservative treatments for greater than 12 weeks to include corticosteriod injections, viscosupplementation injections and activity modification. Onset of symptoms was gradual, starting several years ago with gradually worsening course since that time. The patient noted prior procedures on the knee to include  arthroscopy and menisectomy on the right knee.  Patient currently rates pain in the right knee at 8 out of 10 with activity. Patient has worsening of pain with activity and weight bearing, pain that interferes with activities of daily living, crepitus and joint swelling. Patient has evidence of bone-on-bone arthritis in the lateral compartment and patellofemoral compartment. There is significant chondrocalcinosis and near bone-on-bone medial by imaging studies. There is no active infection.  Patient Active Problem List   Diagnosis Date Noted  . Essential hypertension 08/06/2018  . Insomnia 06/19/2018  . Overweight (BMI 25.0-29.9) 09/19/2017  . Low serum vitamin D 05/24/2016  . Osteopenia 05/23/2016  . Atrial fibrillation ([place]) 07/13/2015  . Syncope 05/05/2014    Past Medical History:  Diagnosis Date  . Arthritis   . Atrial fibrillation ([place]) 05/2015  . Dysrhythmia    afib  . Hypertension   . Osteopenia   . Syncope 04/2014    Past Surgical History:  Procedure Laterality Date  . BREAST EXCISIONAL BIOPSY Left   . BREAST EXCISIONAL BIOPSY Right   . COLONOSCOPY  2011  . KNEE ARTHROCENTESIS Bilateral   . KNEE ARTHROSCOPY     bil  . POLYPECTOMY  11-01-2009  . ROTATOR CUFF REPAIR    . SHOULDER SURGERY Left   . TOTAL KNEE ARTHROPLASTY Left   . VAGINAL HYSTERECTOMY     twice - vaginal and then  oopherectomy    Prior to Admission medications   Medication Sig Start Date End Date Taking? Authorizing Provider  acetaminophen (TYLENOL) 650 MG CR tablet Take 1,300 mg by mouth every 8 (eight) hours as needed for pain.    Yes [provider]  diltiazem (CARDIZEM CD) 360 MG 24 hr capsule Take 1 capsule (360 mg total) by mouth daily. 10/23/18 09/22/19 Yes Hochrein, Jeneen Rinks, MD  ELIQUIS 5 MG TABS tablet TAKE ONE TABLET BY MOUTH TWICE DAILY Patient taking differently: Take 5 mg by mouth 2 (two) times daily.  07/30/19  Yes [name], MD  flecainide (TAMBOCOR) 50 MG tablet TAKE ONE TABLET BY MOUTH TWICE DAILY Patient taking differently: Take 50 mg by mouth 2 (two) times daily.  08/06/19  Yes Dettinger, Fransisca Kaufmann, MD  zolpidem (AMBIEN) 5 MG tablet Take 1 tablet (5 mg total) by mouth at bedtime as needed for sleep. 08/28/19  Yes Dettinger, Fransisca Kaufmann, MD  cholecalciferol (VITAMIN D3) 25 MCG (1000 UT) tablet Take 2,000 Units by mouth 2 (two) times daily.    [provider]    No Known Allergies  Social History   Socioeconomic History  . Marital status: Widowed    Spouse name: Not on file  . Number of children: 1  . Years of education: 29  . Highest education level: High school graduate  Occupational History  . Occupation: retired  Tobacco Use  . Smoking status: Never Smoker  . Smokeless tobacco: Never Used  Vaping Use  . Vaping Use: Never used  Substance and Sexual  Activity  . Alcohol use: No    Alcohol/week: 0.0 standard drinks  . Drug use: No  . Sexual activity: Not Currently  Other Topics Concern  . Not on file  Social History Narrative        Social Determinants of Health   Financial Resource Strain: Low Risk   . Difficulty of Paying Living Expenses: Not hard at all  Food Insecurity: No Food Insecurity  . Worried About Charity fundraiser in the Last Year: Never true  . Ran Out of Food in the Last Year: Never true  Transportation Needs: No Transportation Needs    . Lack of Transportation (Medical): No  . Lack of Transportation (Non-Medical): No  Physical Activity: Insufficiently Active  . Days of Exercise per Week: 7 days  . Minutes of Exercise per Session: 20 min  Stress: No Stress Concern Present  . Feeling of Stress : Only a little  Social Connections: Moderately Integrated  . Frequency of Communication with Friends and Family: More than three times a week  . Frequency of Social Gatherings with Friends and Family: More than three times a week  . Attends Religious Services: More than 4 times per year  . Active Member of Clubs or Organizations: Yes  . Attends Archivist Meetings: More than 4 times per year  . Marital Status: Widowed  Intimate Partner Violence: Not At Risk  . Fear of Current or Ex-Partner: No  . Emotionally Abused: No  . Physically Abused: No  . Sexually Abused: No      Tobacco Use: Low Risk   . Smoking Tobacco Use: Never Smoker  . Smokeless Tobacco Use: Never Used   Social History   Substance and Sexual Activity  Alcohol Use No  . Alcohol/week: 0.0 standard drinks    Family History  Problem Relation Age of Onset  . Heart failure Mother   . Hypertension Father   . Arthritis Father   . Hypertension Sister   . Arthritis Sister   . Cancer Brother   . Bladder Cancer Brother   . Hypertension Brother   . Diabetes Brother     Review of Systems  Constitutional: Negative for chills and fever.  HENT: Negative for congestion, sore throat and tinnitus.   Eyes: Negative for double vision, photophobia and pain.  Respiratory: Negative for cough, shortness of breath and wheezing.   Cardiovascular: Negative for chest pain, palpitations and orthopnea.  Gastrointestinal: Negative for heartburn, nausea and vomiting.  Genitourinary: Negative for dysuria, frequency and urgency.  Musculoskeletal: Positive for joint pain.  Neurological: Negative for dizziness, weakness and headaches.    Objective:  Physical  Exam: Well nourished and well developed.  General: Alert and oriented x3, cooperative and pleasant, no acute distress.  Head: normocephalic, atraumatic, neck supple.  Eyes: EOMI.  Respiratory: breath sounds clear in all fields, no wheezing, rales, or rhonchi. Cardiovascular: Regular rate and rhythm, no murmurs, gallops or rubs.  Abdomen: non-tender to palpation and soft, normoactive bowel sounds. Musculoskeletal:  Right knee Exam: Inspection: Valgus deformity. No effusion. Palpation: Tenderness laterally. No medial tenderness or instability noted. Range of Motion: Range of motion is from 5 to 125 degrees  Calves soft and nontender. Motor function intact in LE. Strength 5/5 LE bilaterally. Neuro: Distal pulses 2+. Sensation to light touch intact in LE.  Imaging Review Plain radiographs demonstrate severe degenerative joint disease of the right knee. The overall alignment is significant valgus. The bone quality appears to be adequate for age and  reported activity level.  Assessment/Plan:  End stage arthritis, right knee   The patient history, physical examination, clinical judgment of the provider and imaging studies are consistent with end stage degenerative joint disease of the right knee and total knee arthroplasty is deemed medically necessary. The treatment options including medical management, injection therapy arthroscopy and arthroplasty were discussed at length. The risks and benefits of total knee arthroplasty were presented and reviewed. The risks due to aseptic loosening, infection, stiffness, patella tracking problems, thromboembolic complications and other imponderables were discussed. The patient acknowledged the explanation, agreed to proceed with the plan and consent was signed. Patient is being admitted for inpatient treatment for surgery, pain control, PT, OT, prophylactic antibiotics, VTE prophylaxis, progressive ambulation and ADLs and discharge planning. The patient is  planning to be discharged home.  Patient's anticipated LOS is less than 2 midnights, meeting these requirements: - Lives within 1 hour of care - Has a competent adult at home to recover with post-op recover - NO history of  - Chronic pain requiring opiods  - Diabetes  - Coronary Artery Disease  - Heart failure  - Heart attack  - Stroke  - DVT/VTE  - Respiratory Failure/COPD  - Renal failure  - Anemia  - Advanced Liver disease  Therapy Plans: Outpatient therapy at [facility]) Disposition: Home with son Planned DVT Prophylaxis: Eliquis 5 mg BID DME Needed: None PCP: Caryl Pina, MD (clearance recieved) Cardiologist: [name], MD (clearance received) TXA: IV Allergies: NKDA Anesthesia Concerns: None BMI: 30.4  Other: Hx atrial fibrillation  - Patient was instructed on what medications to stop prior to surgery. - Follow-up visit in 2 weeks with Dr. Wynelle Link - Begin physical therapy following surgery - Pre-operative lab work as pre-surgical testing - Prescriptions will be provided in hospital at time of discharge  Theresa Duty, PA-C Orthopedic Surgery [facility] [place]

## 2019-10-08 ENCOUNTER — Other Ambulatory Visit (HOSPITAL_COMMUNITY)
Admission: RE | Admit: 2019-10-08 | Discharge: 2019-10-08 | Disposition: A | Discharge status: Home / Self Care | Payer: PPO | Source: Ambulatory Visit | Attending: Orthopedic Surgery | Admitting: Orthopedic Surgery

## 2019-10-08 DIAGNOSIS — Z20822 Contact with and (suspected) exposure to covid-19: Secondary | ICD-10-CM | POA: Insufficient documentation

## 2019-10-08 DIAGNOSIS — Z01812 Encounter for preprocedural laboratory examination: Secondary | ICD-10-CM | POA: Diagnosis not present

## 2019-10-08 LAB — SARS CORONAVIRUS 2 (TAT 6-24 HRS): SARS Coronavirus 2: NEGATIVE

## 2019-10-11 MED ORDER — BUPIVACAINE LIPOSOME 1.3 % IJ SUSP
20.0000 mL | INTRAMUSCULAR | Status: AC
  Filled 2019-10-11: qty 20

## 2019-10-12 ENCOUNTER — Encounter (HOSPITAL_COMMUNITY)
Admission: RE | Disposition: A | Discharge status: Home / Self Care | Payer: Self-pay | Source: Ambulatory Visit | Attending: Orthopedic Surgery

## 2019-10-12 ENCOUNTER — Ambulatory Visit (HOSPITAL_COMMUNITY): Payer: PPO | Admitting: Physician Assistant

## 2019-10-12 ENCOUNTER — Other Ambulatory Visit: Payer: Self-pay

## 2019-10-12 ENCOUNTER — Ambulatory Visit (HOSPITAL_COMMUNITY): Payer: PPO | Admitting: Certified Registered Nurse Anesthetist

## 2019-10-12 ENCOUNTER — Ambulatory Visit (HOSPITAL_COMMUNITY)
Admission: RE | Admit: 2019-10-12 | Discharge: 2019-10-13 | Disposition: A | Discharge status: Home / Self Care | Payer: PPO | Source: Ambulatory Visit | Attending: Orthopedic Surgery | Admitting: Orthopedic Surgery

## 2019-10-12 ENCOUNTER — Encounter (HOSPITAL_COMMUNITY): Payer: Self-pay | Admitting: Orthopedic Surgery

## 2019-10-12 DIAGNOSIS — M25761 Osteophyte, right knee: Secondary | ICD-10-CM | POA: Insufficient documentation

## 2019-10-12 DIAGNOSIS — Z8249 Family history of ischemic heart disease and other diseases of the circulatory system: Secondary | ICD-10-CM | POA: Diagnosis not present

## 2019-10-12 DIAGNOSIS — I4891 Unspecified atrial fibrillation: Secondary | ICD-10-CM | POA: Insufficient documentation

## 2019-10-12 DIAGNOSIS — Z96652 Presence of left artificial knee joint: Secondary | ICD-10-CM | POA: Diagnosis not present

## 2019-10-12 DIAGNOSIS — Z7901 Long term (current) use of anticoagulants: Secondary | ICD-10-CM | POA: Diagnosis not present

## 2019-10-12 DIAGNOSIS — M1711 Unilateral primary osteoarthritis, right knee: Secondary | ICD-10-CM | POA: Diagnosis not present

## 2019-10-12 DIAGNOSIS — Z8261 Family history of arthritis: Secondary | ICD-10-CM | POA: Insufficient documentation

## 2019-10-12 DIAGNOSIS — M8580 Other specified disorders of bone density and structure, unspecified site: Secondary | ICD-10-CM | POA: Insufficient documentation

## 2019-10-12 DIAGNOSIS — G4700 Insomnia, unspecified: Secondary | ICD-10-CM | POA: Insufficient documentation

## 2019-10-12 DIAGNOSIS — Z79899 Other long term (current) drug therapy: Secondary | ICD-10-CM | POA: Insufficient documentation

## 2019-10-12 DIAGNOSIS — M25561 Pain in right knee: Secondary | ICD-10-CM | POA: Diagnosis not present

## 2019-10-12 DIAGNOSIS — M179 Osteoarthritis of knee, unspecified: Secondary | ICD-10-CM | POA: Diagnosis present

## 2019-10-12 DIAGNOSIS — G8918 Other acute postprocedural pain: Secondary | ICD-10-CM | POA: Diagnosis not present

## 2019-10-12 DIAGNOSIS — I10 Essential (primary) hypertension: Secondary | ICD-10-CM | POA: Diagnosis not present

## 2019-10-12 HISTORY — PX: TOTAL KNEE ARTHROPLASTY: SHX125

## 2019-10-12 LAB — TYPE AND SCREEN
ABO/RH(D): O POS
Antibody Screen: NEGATIVE

## 2019-10-12 SURGERY — ARTHROPLASTY, KNEE, TOTAL
Anesthesia: Spinal | Site: Knee | Laterality: Right

## 2019-10-12 MED ORDER — SODIUM CHLORIDE (PF) 0.9 % IJ SOLN
INTRAMUSCULAR | Status: AC
  Filled 2019-10-12: qty 50

## 2019-10-12 MED ORDER — POLYETHYLENE GLYCOL 3350 17 G PO PACK: 17.0000 g | PACK | Freq: Every day | ORAL | Status: DC | PRN

## 2019-10-12 MED ORDER — PROPOFOL 10 MG/ML IV BOLUS
INTRAVENOUS | Status: AC
  Filled 2019-10-12: qty 40

## 2019-10-12 MED ORDER — MORPHINE SULFATE (PF) 4 MG/ML IV SOLN: 0.5000 mg | INTRAVENOUS | Status: DC | PRN

## 2019-10-12 MED ORDER — LIDOCAINE 2% (20 MG/ML) 5 ML SYRINGE
INTRAMUSCULAR | Status: AC
  Filled 2019-10-12: qty 5

## 2019-10-12 MED ORDER — 0.9 % SODIUM CHLORIDE (POUR BTL) OPTIME
TOPICAL | Status: DC | PRN
  Administered 2019-10-12: 1000 mL

## 2019-10-12 MED ORDER — CEFAZOLIN SODIUM-DEXTROSE 2-4 GM/100ML-% IV SOLN
2.0000 g | INTRAVENOUS | Status: AC
  Administered 2019-10-12: 2 g via INTRAVENOUS
  Filled 2019-10-12: qty 100

## 2019-10-12 MED ORDER — APIXABAN 2.5 MG PO TABS
2.5000 mg | ORAL_TABLET | Freq: Two times a day (BID) | ORAL | Status: DC
  Administered 2019-10-13: 2.5 mg via ORAL
  Filled 2019-10-12: qty 1

## 2019-10-12 MED ORDER — OXYCODONE HCL 5 MG/5ML PO SOLN: 5.0000 mg | Freq: Once | ORAL | Status: DC | PRN

## 2019-10-12 MED ORDER — PHENYLEPHRINE 40 MCG/ML (10ML) SYRINGE FOR IV PUSH (FOR BLOOD PRESSURE SUPPORT)
PREFILLED_SYRINGE | INTRAVENOUS | Status: AC
  Filled 2019-10-12: qty 10

## 2019-10-12 MED ORDER — CHLORHEXIDINE GLUCONATE 0.12 % MT SOLN
15.0000 mL | Freq: Once | OROMUCOSAL | Status: AC
  Administered 2019-10-12: 15 mL via OROMUCOSAL

## 2019-10-12 MED ORDER — LACTATED RINGERS IV SOLN: INTRAVENOUS | Status: DC

## 2019-10-12 MED ORDER — ONDANSETRON HCL 4 MG/2ML IJ SOLN
4.0000 mg | Freq: Four times a day (QID) | INTRAMUSCULAR | Status: DC | PRN
  Administered 2019-10-12: 4 mg via INTRAVENOUS
  Filled 2019-10-12: qty 2

## 2019-10-12 MED ORDER — ONDANSETRON HCL 4 MG/2ML IJ SOLN
INTRAMUSCULAR | Status: AC
  Filled 2019-10-12: qty 2

## 2019-10-12 MED ORDER — PROMETHAZINE HCL 25 MG/ML IJ SOLN: 6.2500 mg | INTRAMUSCULAR | Status: DC | PRN

## 2019-10-12 MED ORDER — HYDROMORPHONE HCL 1 MG/ML IJ SOLN
INTRAMUSCULAR | Status: AC
  Filled 2019-10-12: qty 1

## 2019-10-12 MED ORDER — ZOLPIDEM TARTRATE 5 MG PO TABS
5.0000 mg | ORAL_TABLET | Freq: Every evening | ORAL | Status: DC | PRN
  Administered 2019-10-12: 5 mg via ORAL
  Filled 2019-10-12: qty 1

## 2019-10-12 MED ORDER — PHENYLEPHRINE HCL-NACL 10-0.9 MG/250ML-% IV SOLN
INTRAVENOUS | Status: DC | PRN
Start: 2019-10-12 — End: 2019-10-12
  Administered 2019-10-12: 30 ug/min via INTRAVENOUS

## 2019-10-12 MED ORDER — ONDANSETRON HCL 4 MG PO TABS: 4.0000 mg | ORAL_TABLET | Freq: Four times a day (QID) | ORAL | Status: DC | PRN

## 2019-10-12 MED ORDER — DIPHENHYDRAMINE HCL 12.5 MG/5ML PO ELIX: 12.5000 mg | ORAL_SOLUTION | ORAL | Status: DC | PRN

## 2019-10-12 MED ORDER — PROPOFOL 10 MG/ML IV BOLUS
INTRAVENOUS | Status: DC | PRN
Start: 2019-10-12 — End: 2019-10-12
  Administered 2019-10-12 (×2): 10 mg via INTRAVENOUS

## 2019-10-12 MED ORDER — POVIDONE-IODINE 10 % EX SWAB
2.0000 "application " | Freq: Once | CUTANEOUS | Status: AC
  Administered 2019-10-12: 2 via TOPICAL

## 2019-10-12 MED ORDER — OXYCODONE HCL 5 MG PO TABS: 5.0000 mg | ORAL_TABLET | Freq: Once | ORAL | Status: DC | PRN

## 2019-10-12 MED ORDER — DEXAMETHASONE SODIUM PHOSPHATE 10 MG/ML IJ SOLN
8.0000 mg | Freq: Once | INTRAMUSCULAR | Status: AC
  Administered 2019-10-12: 10 mg via INTRAVENOUS

## 2019-10-12 MED ORDER — ACETAMINOPHEN 10 MG/ML IV SOLN
1000.0000 mg | Freq: Four times a day (QID) | INTRAVENOUS | Status: DC
  Administered 2019-10-12: 1000 mg via INTRAVENOUS
  Filled 2019-10-12: qty 100

## 2019-10-12 MED ORDER — METHOCARBAMOL 500 MG PO TABS
500.0000 mg | ORAL_TABLET | Freq: Four times a day (QID) | ORAL | Status: DC | PRN
  Administered 2019-10-12 – 2019-10-13 (×3): 500 mg via ORAL
  Filled 2019-10-12 (×3): qty 1

## 2019-10-12 MED ORDER — MENTHOL 3 MG MT LOZG: 1.0000 | LOZENGE | OROMUCOSAL | Status: DC | PRN

## 2019-10-12 MED ORDER — SODIUM CHLORIDE (PF) 0.9 % IJ SOLN
INTRAMUSCULAR | Status: AC
  Filled 2019-10-12: qty 10

## 2019-10-12 MED ORDER — SODIUM CHLORIDE 0.9 % IR SOLN
Status: DC | PRN
  Administered 2019-10-12: 1000 mL

## 2019-10-12 MED ORDER — PHENYLEPHRINE 40 MCG/ML (10ML) SYRINGE FOR IV PUSH (FOR BLOOD PRESSURE SUPPORT)
PREFILLED_SYRINGE | INTRAVENOUS | Status: DC | PRN
  Administered 2019-10-12 (×2): 80 ug via INTRAVENOUS
  Administered 2019-10-12 (×2): 40 ug via INTRAVENOUS
  Administered 2019-10-12: 80 ug via INTRAVENOUS

## 2019-10-12 MED ORDER — FENTANYL CITRATE (PF) 100 MCG/2ML IJ SOLN
INTRAMUSCULAR | Status: AC
  Filled 2019-10-12: qty 2

## 2019-10-12 MED ORDER — PROPOFOL 1000 MG/100ML IV EMUL
INTRAVENOUS | Status: AC
  Filled 2019-10-12: qty 100

## 2019-10-12 MED ORDER — METOCLOPRAMIDE HCL 5 MG/ML IJ SOLN: 5.0000 mg | Freq: Three times a day (TID) | INTRAMUSCULAR | Status: DC | PRN

## 2019-10-12 MED ORDER — DEXAMETHASONE SODIUM PHOSPHATE 10 MG/ML IJ SOLN
INTRAMUSCULAR | Status: AC
  Filled 2019-10-12: qty 1

## 2019-10-12 MED ORDER — METOCLOPRAMIDE HCL 5 MG PO TABS: 5.0000 mg | ORAL_TABLET | Freq: Three times a day (TID) | ORAL | Status: DC | PRN

## 2019-10-12 MED ORDER — STERILE WATER FOR IRRIGATION IR SOLN
Status: DC | PRN
  Administered 2019-10-12: 2000 mL

## 2019-10-12 MED ORDER — OXYCODONE HCL 5 MG PO TABS
5.0000 mg | ORAL_TABLET | ORAL | Status: DC | PRN
  Administered 2019-10-12 – 2019-10-13 (×5): 10 mg via ORAL
  Filled 2019-10-12 (×5): qty 2

## 2019-10-12 MED ORDER — BUPIVACAINE LIPOSOME 1.3 % IJ SUSP
INTRAMUSCULAR | Status: DC | PRN
  Administered 2019-10-12: 20 mL

## 2019-10-12 MED ORDER — PROPOFOL 500 MG/50ML IV EMUL
INTRAVENOUS | Status: DC | PRN
  Administered 2019-10-12: 50 ug/kg/min via INTRAVENOUS

## 2019-10-12 MED ORDER — TRANEXAMIC ACID-NACL 1000-0.7 MG/100ML-% IV SOLN
1000.0000 mg | INTRAVENOUS | Status: AC
  Administered 2019-10-12: 1000 mg via INTRAVENOUS
  Filled 2019-10-12: qty 100

## 2019-10-12 MED ORDER — FLECAINIDE ACETATE 50 MG PO TABS
50.0000 mg | ORAL_TABLET | Freq: Two times a day (BID) | ORAL | Status: DC
  Administered 2019-10-12 – 2019-10-13 (×2): 50 mg via ORAL
  Filled 2019-10-12 (×2): qty 1

## 2019-10-12 MED ORDER — MIDAZOLAM HCL 2 MG/2ML IJ SOLN
1.0000 mg | INTRAMUSCULAR | Status: DC
  Administered 2019-10-12: 1 mg via INTRAVENOUS
  Filled 2019-10-12: qty 2

## 2019-10-12 MED ORDER — FENTANYL CITRATE (PF) 100 MCG/2ML IJ SOLN
50.0000 ug | INTRAMUSCULAR | Status: DC
  Administered 2019-10-12: 50 ug via INTRAVENOUS
  Filled 2019-10-12: qty 2

## 2019-10-12 MED ORDER — LIDOCAINE HCL (CARDIAC) PF 100 MG/5ML IV SOSY
PREFILLED_SYRINGE | INTRAVENOUS | Status: DC | PRN
  Administered 2019-10-12: 30 mg via INTRAVENOUS

## 2019-10-12 MED ORDER — CEFAZOLIN SODIUM-DEXTROSE 2-4 GM/100ML-% IV SOLN
2.0000 g | Freq: Four times a day (QID) | INTRAVENOUS | Status: AC
  Administered 2019-10-12 – 2019-10-13 (×2): 2 g via INTRAVENOUS
  Filled 2019-10-12 (×2): qty 100

## 2019-10-12 MED ORDER — DILTIAZEM HCL ER COATED BEADS 180 MG PO CP24
360.0000 mg | ORAL_CAPSULE | Freq: Every day | ORAL | Status: DC
  Administered 2019-10-13: 360 mg via ORAL
  Filled 2019-10-12: qty 2

## 2019-10-12 MED ORDER — FLEET ENEMA 7-19 GM/118ML RE ENEM: 1.0000 | ENEMA | Freq: Once | RECTAL | Status: DC | PRN

## 2019-10-12 MED ORDER — HYDROMORPHONE HCL 1 MG/ML IJ SOLN
0.2500 mg | INTRAMUSCULAR | Status: DC | PRN
  Administered 2019-10-12 (×2): 0.5 mg via INTRAVENOUS

## 2019-10-12 MED ORDER — BISACODYL 10 MG RE SUPP: 10.0000 mg | Freq: Every day | RECTAL | Status: DC | PRN

## 2019-10-12 MED ORDER — PHENOL 1.4 % MT LIQD: 1.0000 | OROMUCOSAL | Status: DC | PRN

## 2019-10-12 MED ORDER — SODIUM CHLORIDE 0.9 % IV SOLN: INTRAVENOUS | Status: DC

## 2019-10-12 MED ORDER — ONDANSETRON HCL 4 MG/2ML IJ SOLN
INTRAMUSCULAR | Status: DC | PRN
  Administered 2019-10-12: 4 mg via INTRAVENOUS

## 2019-10-12 MED ORDER — ROPIVACAINE HCL 5 MG/ML IJ SOLN
INTRAMUSCULAR | Status: DC | PRN
Start: 2019-10-12 — End: 2019-10-12
  Administered 2019-10-12: 20 mL via PERINEURAL

## 2019-10-12 MED ORDER — SODIUM CHLORIDE (PF) 0.9 % IJ SOLN
INTRAMUSCULAR | Status: DC | PRN
  Administered 2019-10-12: 60 mL

## 2019-10-12 MED ORDER — METHOCARBAMOL 500 MG IVPB - SIMPLE MED
500.0000 mg | Freq: Four times a day (QID) | INTRAVENOUS | Status: DC | PRN
  Filled 2019-10-12: qty 50

## 2019-10-12 MED ORDER — ACETAMINOPHEN 500 MG PO TABS
1000.0000 mg | ORAL_TABLET | Freq: Four times a day (QID) | ORAL | Status: AC
  Administered 2019-10-12 – 2019-10-13 (×4): 1000 mg via ORAL
  Filled 2019-10-12 (×4): qty 2

## 2019-10-12 MED ORDER — TRAMADOL HCL 50 MG PO TABS
50.0000 mg | ORAL_TABLET | Freq: Four times a day (QID) | ORAL | Status: DC | PRN
  Administered 2019-10-12 – 2019-10-13 (×3): 100 mg via ORAL
  Filled 2019-10-12 (×3): qty 2

## 2019-10-12 MED ORDER — DOCUSATE SODIUM 100 MG PO CAPS
100.0000 mg | ORAL_CAPSULE | Freq: Two times a day (BID) | ORAL | Status: DC
  Administered 2019-10-12 – 2019-10-13 (×2): 100 mg via ORAL
  Filled 2019-10-12 (×2): qty 1

## 2019-10-12 MED ORDER — BUPIVACAINE IN DEXTROSE 0.75-8.25 % IT SOLN
INTRATHECAL | Status: DC | PRN
  Administered 2019-10-12: 14 mg via INTRATHECAL

## 2019-10-12 MED ORDER — DEXAMETHASONE SODIUM PHOSPHATE 10 MG/ML IJ SOLN
10.0000 mg | Freq: Once | INTRAMUSCULAR | Status: AC
  Administered 2019-10-13: 10 mg via INTRAVENOUS
  Filled 2019-10-12: qty 1

## 2019-10-12 MED ORDER — FENTANYL CITRATE (PF) 100 MCG/2ML IJ SOLN
INTRAMUSCULAR | Status: DC | PRN
  Administered 2019-10-12 (×3): 25 ug via INTRAVENOUS

## 2019-10-12 MED ORDER — ORAL CARE MOUTH RINSE: 15.0000 mL | Freq: Once | OROMUCOSAL | Status: AC

## 2019-10-12 SURGICAL SUPPLY — 57 items
BAG SPEC THK2 15X12 ZIP CLS (MISCELLANEOUS) ×1
BAG ZIPLOCK 12X15 (MISCELLANEOUS) ×3 IMPLANT
BLADE SAG 18X100X1.27 (BLADE) ×3 IMPLANT
BLADE SAW SGTL 11.0X1.19X90.0M (BLADE) ×3 IMPLANT
BLADE SURG SZ10 CARB STEEL (BLADE) ×6 IMPLANT
BNDG CMPR MED 10X6 ELC LF (GAUZE/BANDAGES/DRESSINGS) ×1
BNDG ELASTIC 6X10 VLCR STRL LF (GAUZE/BANDAGES/DRESSINGS) ×2 IMPLANT
BNDG ELASTIC 6X5.8 VLCR STR LF (GAUZE/BANDAGES/DRESSINGS) ×3 IMPLANT
BOWL SMART MIX CTS (DISPOSABLE) ×3 IMPLANT
CEMENT HV SMART SET (Cement) ×4 IMPLANT
CEMENT TIBIA MBT SIZE 4 (Knees) IMPLANT
CLOSURE STERI-STRIP 1/2X4 (GAUZE/BANDAGES/DRESSINGS) ×2
CLOSURE WOUND 1/2 X4 (GAUZE/BANDAGES/DRESSINGS) ×2
CLSR STERI-STRIP ANTIMIC 1/2X4 (GAUZE/BANDAGES/DRESSINGS) ×2 IMPLANT
COVER SURGICAL LIGHT HANDLE (MISCELLANEOUS) ×3 IMPLANT
COVER WAND RF STERILE (DRAPES) IMPLANT
CUFF TOURN SGL QUICK 34 (TOURNIQUET CUFF) ×3
CUFF TRNQT CYL 34X4.125X (TOURNIQUET CUFF) ×1 IMPLANT
DECANTER SPIKE VIAL GLASS SM (MISCELLANEOUS) ×3 IMPLANT
DRAPE U-SHAPE 47X51 STRL (DRAPES) ×3 IMPLANT
DRSG AQUACEL AG ADV 3.5X10 (GAUZE/BANDAGES/DRESSINGS) ×3 IMPLANT
DURAPREP 26ML APPLICATOR (WOUND CARE) ×3 IMPLANT
ELECT REM PT RETURN 15FT ADLT (MISCELLANEOUS) ×3 IMPLANT
FEMUR SIGMA PS SZ 3.0 R (Femur) ×2 IMPLANT
GLOVE BIO SURGEON STRL SZ7 (GLOVE) ×3 IMPLANT
GLOVE BIO SURGEON STRL SZ8 (GLOVE) ×3 IMPLANT
GLOVE BIOGEL PI IND STRL 7.0 (GLOVE) ×1 IMPLANT
GLOVE BIOGEL PI IND STRL 8 (GLOVE) ×1 IMPLANT
GLOVE BIOGEL PI INDICATOR 7.0 (GLOVE) ×2
GLOVE BIOGEL PI INDICATOR 8 (GLOVE) ×2
GOWN STRL REUS W/TWL LRG LVL3 (GOWN DISPOSABLE) ×6 IMPLANT
HANDPIECE INTERPULSE COAX TIP (DISPOSABLE) ×3
HOLDER FOLEY CATH W/STRAP (MISCELLANEOUS) IMPLANT
IMMOBILIZER KNEE 20 (SOFTGOODS) ×3
IMMOBILIZER KNEE 20 THIGH 36 (SOFTGOODS) ×1 IMPLANT
INSERT PFC SIG STB SZ3 15.0MM (Knees) ×2 IMPLANT
KIT TURNOVER KIT A (KITS) ×2 IMPLANT
MANIFOLD NEPTUNE II (INSTRUMENTS) ×3 IMPLANT
NS IRRIG 1000ML POUR BTL (IV SOLUTION) ×3 IMPLANT
PACK TOTAL KNEE CUSTOM (KITS) ×3 IMPLANT
PADDING CAST COTTON 6X4 STRL (CAST SUPPLIES) ×6 IMPLANT
PATELLA DOME PFC 38MM (Knees) ×2 IMPLANT
PENCIL SMOKE EVACUATOR (MISCELLANEOUS) IMPLANT
PIN STEINMAN FIXATION KNEE (PIN) ×2 IMPLANT
PROTECTOR NERVE ULNAR (MISCELLANEOUS) ×3 IMPLANT
SET HNDPC FAN SPRY TIP SCT (DISPOSABLE) ×1 IMPLANT
STRIP CLOSURE SKIN 1/2X4 (GAUZE/BANDAGES/DRESSINGS) ×4 IMPLANT
SUT MNCRL AB 4-0 PS2 18 (SUTURE) ×3 IMPLANT
SUT STRATAFIX 0 PDS 27 VIOLET (SUTURE) ×3
SUT VIC AB 2-0 CT1 27 (SUTURE) ×9
SUT VIC AB 2-0 CT1 TAPERPNT 27 (SUTURE) ×3 IMPLANT
SUTURE STRATFX 0 PDS 27 VIOLET (SUTURE) ×1 IMPLANT
TIBIA MBT CEMENT SIZE 4 (Knees) ×3 IMPLANT
TRAY FOLEY MTR SLVR 16FR STAT (SET/KITS/TRAYS/PACK) ×3 IMPLANT
WATER STERILE IRR 1000ML POUR (IV SOLUTION) ×6 IMPLANT
WRAP KNEE MAXI GEL POST OP (GAUZE/BANDAGES/DRESSINGS) ×3 IMPLANT
YANKAUER SUCT BULB TIP 10FT TU (MISCELLANEOUS) ×3 IMPLANT

## 2019-10-12 NOTE — Progress Notes (Signed)
Orthopedic Tech Progress Note Patient Details:  Rhonda [name] [birth year] [id number]  CPM Right Knee CPM Right Knee: Off Right Knee Flexion (Degrees): 40 Right Knee Extension (Degrees): 10 Additional Comments: Trapeze bar  Post Interventions Patient Tolerated: Well Instructions Provided: Care of device  [name] 10/12/2019, 6:19 PM

## 2019-10-12 NOTE — Anesthesia Procedure Notes (Signed)
Anesthesia Regional Block: Adductor canal block   Pre-Anesthetic Checklist: ,, timeout performed, Correct Patient, Correct Site, Correct Laterality, Correct Procedure, Correct Position, site marked, Risks and benefits discussed,  Surgical consent,  Pre-op evaluation,  At surgeon's request and post-op pain management  Laterality: Right  Prep: chloraprep       Needles:  Injection technique: Single-shot  Needle Type: Stimiplex     Needle Length: 9cm  Needle Gauge: 21     Additional Needles:   Procedures:,,,, ultrasound used (permanent image in chart),,,,  Narrative:  Start time: 10/12/2019 12:23 PM End time: 10/12/2019 12:28 PM Injection made incrementally with aspirations every 5 mL.  Performed by: Personally  Anesthesiologist: Lynda Rainwater, MD

## 2019-10-12 NOTE — Progress Notes (Signed)
AssistedDr. Miller with right, ultrasound guided, adductor canal block. Side rails up, monitors on throughout procedure. See vital signs in flow sheet. Tolerated Procedure well.  

## 2019-10-12 NOTE — Anesthesia Procedure Notes (Signed)
Spinal  Patient location during procedure: OR Start time: 10/12/2019 1:16 PM End time: 10/12/2019 1:22 PM Staffing Performed: resident/CRNA  Resident/CRNA: Garrel Ridgel, CRNA Preanesthetic Checklist Completed: patient identified, IV checked, site marked, risks and benefits discussed, surgical consent, monitors and equipment checked, pre-op evaluation and timeout performed Spinal Block Patient position: sitting Prep: Betadine Patient monitoring: heart rate, continuous pulse ox and blood pressure Location: L4-5 Injection technique: single-shot Needle Needle type: Quincke  Needle gauge: 22 G Needle length: 9 cm Needle insertion depth: 6 cm Assessment Sensory level: T10 Additional Notes Expiration date of kit checked and confirmed. Patient tolerated procedure well, without complications.

## 2019-10-12 NOTE — Op Note (Signed)
OPERATIVE REPORT-TOTAL KNEE ARTHROPLASTY   Pre-operative diagnosis- Osteoarthritis  Right knee(s)  Post-operative diagnosis- Osteoarthritis Right knee(s)  Procedure-  Right  Total Knee Arthroplasty  Surgeon- Dione Plover. Abhijay Morriss, MD  Assistant- Molli Barrows, PA-C   Anesthesia-  Adductor canal block and spinal  EBL-40 mL   Drains Hemovac  Tourniquet time-  Total Tourniquet Time Documented: Thigh (Right) - 36 minutes Total: Thigh (Right) - 36 minutes     Complications- None  Condition-PACU - hemodynamically stable.   Brief Clinical Note  Rhonda [name] is a 86 y.o. year old female with end stage OA of her right knee with progressively worsening pain and dysfunction. She has constant pain, with activity and at rest and significant functional deficits with difficulties even with ADLs. She has had extensive non-op management including analgesics, injections of cortisone and viscosupplements, and home exercise program, but remains in significant pain with significant dysfunction.Radiographs show bone on bone arthritis lateral and patellofemoral with valgus deformity. She presents now for right Total Knee Arthroplasty.    Procedure in detail---   The patient is brought into the operating room and positioned supine on the operating table. After successful administration of  Adductor canal block and spinal,   a tourniquet is placed high on the  Right thigh(s) and the lower extremity is prepped and draped in the usual sterile fashion. Time out is performed by the operating team and then the  Right lower extremity is wrapped in Esmarch, knee flexed and the tourniquet inflated to 300 mmHg.       A midline incision is made with a ten blade through the subcutaneous tissue to the level of the extensor mechanism. A fresh blade is used to make a medial parapatellar arthrotomy. Soft tissue over the proximal medial tibia is subperiosteally elevated to the joint line with a knife and into the  semimembranosus bursa with a Cobb elevator. Soft tissue over the proximal lateral tibia is elevated with attention being paid to avoiding the patellar tendon on the tibial tubercle. The patella is everted, knee flexed 90 degrees and the ACL and PCL are removed. Findings are bone on bone lateral and patellofemoral with large global osteophytes.        The drill is used to create a starting hole in the distal femur and the canal is thoroughly irrigated with sterile saline to remove the fatty contents. The 5 degree Left  valgus alignment guide is placed into the femoral canal and the distal femoral cutting block is pinned to remove 10 mm off the distal femur. Resection is made with an oscillating saw.      The tibia is subluxed forward and the menisci are removed. The extramedullary alignment guide is placed referencing proximally at the medial aspect of the tibial tubercle and distally along the second metatarsal axis and tibial crest. The block is pinned to remove 25mm off the more deficient lateral  side. Resection is made with an oscillating saw. Size 4is the most appropriate size for the tibia and the proximal tibia is prepared with the modular drill and keel punch for that size.      The femoral sizing guide is placed and size 3 is most appropriate. Rotation is marked off the epicondylar axis and confirmed by creating a rectangular flexion gap at 90 degrees. The size 3 cutting block is pinned in this rotation and the anterior, posterior and chamfer cuts are made with the oscillating saw. The intercondylar block is then placed and that cut is  made.      Trial size 4 tibial component, trial size 3 posterior stabilized femur and a 15  mm posterior stabilized rotating platform insert trial is placed. Full extension is achieved with excellent varus/valgus and anterior/posterior balance throughout full range of motion. The patella is everted and thickness measured to be 22  mm. Free hand resection is taken to 12 mm,  a 38 template is placed, lug holes are drilled, trial patella is placed, and it tracks normally. Osteophytes are removed off the posterior femur with the trial in place. All trials are removed and the cut bone surfaces prepared with pulsatile lavage. Cement is mixed and once ready for implantation, the size 4 tibial implant, size  3 posterior stabilized femoral component, and the size 38 patella are cemented in place and the patella is held with the clamp. The trial insert is placed and the knee held in full extension. The Exparel (20 ml mixed with 60 ml saline) is injected into the extensor mechanism, posterior capsule, medial and lateral gutters and subcutaneous tissues.  All extruded cement is removed and once the cement is hard the permanent 15 mm posterior stabilized rotating platform insert is placed into the tibial tray.      The wound is copiously irrigated with saline solution and the extensor mechanism closed over a hemovac drain with #1 V-loc suture. The tourniquet is released for a total tourniquet time of 36  minutes. Flexion against gravity is 140 degrees and the patella tracks normally. Subcutaneous tissue is closed with 2.0 vicryl and subcuticular with running 4.0 Monocryl. The incision is cleaned and dried and steri-strips and a bulky sterile dressing are applied. The limb is placed into a knee immobilizer and the patient is awakened and transported to recovery in stable condition.      Please note that a surgical assistant was a medical necessity for this procedure in order to perform it in a safe and expeditious manner. Surgical assistant was necessary to retract the ligaments and vital neurovascular structures to prevent injury to them and also necessary for proper positioning of the limb to allow for anatomic placement of the prosthesis.   Dione Plover Palmina Clodfelter, MD    10/12/2019, 2:23 PM

## 2019-10-12 NOTE — Anesthesia Postprocedure Evaluation (Signed)
Anesthesia Post Note  Patient: Rhonda [name]  Procedure(s) Performed: TOTAL KNEE ARTHROPLASTY (Right Knee)     Patient location during evaluation: PACU Anesthesia Type: Spinal Level of consciousness: awake and alert Pain management: pain level controlled Vital Signs Assessment: post-procedure vital signs reviewed and stable Respiratory status: spontaneous breathing, nonlabored ventilation and respiratory function stable Cardiovascular status: blood pressure returned to baseline and stable Postop Assessment: no apparent nausea or vomiting Anesthetic complications: no   No complications documented.  Last Vitals:  Vitals:   10/12/19 1612 10/12/19 1627  BP: 103/65 111/62  Pulse:  71  Resp: 18 16  Temp: 36.7 C   SpO2: 98% 96%    Last Pain:  Vitals:   10/12/19 1612  TempSrc:   PainSc: [place] Madi Bonfiglio

## 2019-10-12 NOTE — Transfer of Care (Signed)
Immediate Anesthesia Transfer of Care Note  Patient: Rhonda [name]  Procedure(s) Performed: TOTAL KNEE ARTHROPLASTY (Right Knee)  Patient Location: PACU  Anesthesia Type:Spinal  Level of Consciousness: awake and alert   Airway & Oxygen Therapy: Patient Spontanous Breathing and Patient connected to face mask oxygen  Post-op Assessment: Report given to RN and Post -op Vital signs reviewed and stable  Post vital signs: Reviewed and stable  Last Vitals:  Vitals Value Taken Time  BP 112/63 10/12/19 1446  Temp    Pulse 65 10/12/19 1447  Resp 18 10/12/19 1447  SpO2 99 % 10/12/19 1447  Vitals shown include unvalidated device data.  Last Pain:  Vitals:   10/12/19 1300  TempSrc:   PainSc: 0-No pain      Patients Stated Pain Goal: 4 (67/54/49 2010)  Complications: No complications documented.

## 2019-10-12 NOTE — Interval H&P Note (Signed)
History and Physical Interval Note:  10/12/2019 11:01 AM  Rhonda [name]  has presented today for surgery, with the diagnosis of right knee osteoarthritis.  The various methods of treatment have been discussed with the patient and family. After consideration of risks, benefits and other options for treatment, the patient has consented to  Procedure(s) with comments: TOTAL KNEE ARTHROPLASTY (Right) - 54min as a surgical intervention.  The patient's history has been reviewed, patient examined, no change in status, stable for surgery.  I have reviewed the patient's chart and labs.  Questions were answered to the patient's satisfaction.     Pilar Plate Deepti Gunawan

## 2019-10-12 NOTE — Evaluation (Signed)
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  Clinical Impression  Pt s/p R TKA presents with decreased strength, ROM and mobility to benefit from more PT to increase safety to DC home with son assisting initially. During initial session mobilized fairly well, however limited by pain and nauseated as well. Nurse was helping address when we stepped out and son present during evaluation as well.     Follow Up Recommendations Follow surgeon's recommendation for DC plan and follow-up therapies (pt heading to OP at [facility])    Equipment Recommendations  None recommended by PT    Recommendations for Other Services       Precautions / Restrictions Precautions Precautions: Knee Restrictions Weight Bearing Restrictions: No      Mobility  Bed Mobility Overal bed mobility: Needs Assistance Bed Mobility: Supine to Sit;Sit to Supine     Supine to sit: Min assist     General bed mobility comments: assist with LE and with body scooting to the edge  Transfers Overall transfer level: Needs assistance Equipment used: Rolling walker (2 wheeled) Transfers: Sit to/from Stand Sit to Stand: Mod assist;From elevated surface         General transfer comment: need a boost to start sit to stand movement  Ambulation/Gait Ambulation/Gait assistance: Min assist Gait Distance (Feet): 5 Feet Assistive device: Rolling walker (2 wheeled) Gait Pattern/deviations: Step-to pattern     General Gait Details: limited due to paina dn nauseated  Stairs            Wheelchair Mobility    Modified Rankin (Stroke Patients Only)       Balance                                             Pertinent Vitals/Pain Pain Assessment: 0-10 Pain Score: 6  Pain Location: R knee on top Pain Descriptors /  Indicators: Aching;Sharp Pain Intervention(s): Monitored during session;Limited activity within patient's tolerance;Ice applied    Home Living Family/patient expects to be discharged to:: Private residence Living Arrangements: Alone Available Help at Discharge: Family (son with be staying with her as long as she needs) Type of Home: House Home Access: Stairs to enter   Technical brewer of Steps: 1 Home Layout: One level Home Equipment: Environmental consultant - 2 wheels      Prior Function Level of Independence: Independent         Comments: was independent and driving     Hand Dominance        Extremity/Trunk Assessment        Lower Extremity Assessment Lower Extremity Assessment: Overall WFL for tasks assessed       Communication   Communication: No difficulties  Cognition Arousal/Alertness: Awake/alert Behavior During Therapy: WFL for tasks assessed/performed Overall Cognitive Status: Within Functional Limits for tasks assessed                                        General Comments      Exercises Total Joint Exercises Ankle Circles/Pumps: AROM;Both;10 reps;Supine Quad Sets: AROM;Supine;Right;5 reps Heel Slides: AAROM;5 reps;Supine;Right Straight Leg Raises: AAROM;Supine;Right;5 reps   Assessment/Plan  PT Assessment Patient needs continued PT services  PT Problem List Decreased strength;Decreased range of motion;Decreased activity tolerance;Decreased mobility       PT Treatment Interventions DME instruction;Therapeutic exercise;Gait training;Stair training;Functional mobility training;Therapeutic activities;Patient/family education    PT Goals (Current goals can be found in the Care Plan section)  Acute Rehab PT Goals Patient Stated Goal: get back to being more active PT Goal Formulation: With patient Time For Goal Achievement: 10/19/19 Potential to Achieve Goals: Good    Frequency 7X/week   Barriers to discharge         Co-evaluation               AM-PAC PT "6 Clicks" Mobility  Outcome Measure Help needed turning from your back to your side while in a flat bed without using bedrails?: A Little Help needed moving from lying on your back to sitting on the side of a flat bed without using bedrails?: A Little Help needed moving to and from a bed to a chair (including a wheelchair)?: A Little Help needed standing up from a chair using your arms (e.g., wheelchair or bedside chair)?: A Little Help needed to walk in hospital room?: A Little Help needed climbing 3-5 steps with a railing? : A Little 6 Click Score: 18    End of Session Equipment Utilized During Treatment: Gait belt Activity Tolerance: Patient limited by pain Patient left: in chair;with call bell/phone within reach;with chair alarm set Nurse Communication: Mobility status PT Visit Diagnosis: Other abnormalities of gait and mobility (R26.89)    Time: 6811-5726 PT Time Calculation (min) (ACUTE ONLY): 1355 min   Charges:   PT Evaluation $PT Eval Low Complexity: 1 Low PT Treatments $Therapeutic Activity: 8-22 mins        Arnell Slivinski, PT, MPT Acute Rehabilitation Services Office: [phone] Pager: [phone] 10/12/2019   Clide Dales 10/12/2019, 6:44 PM

## 2019-10-12 NOTE — Progress Notes (Signed)
Orthopedic Tech Progress Note Patient Details:  Rhonda [name] [facility] 08-25-1932 [id number]  CPM Right Knee CPM Right Knee: On Right Knee Flexion (Degrees): 40 Right Knee Extension (Degrees): 10 Additional Comments: Trapeze bar  Post Interventions Patient Tolerated: Well Instructions Provided: Care of device  [name] 10/12/2019, 3:11 PM

## 2019-10-12 NOTE — Discharge Instructions (Signed)
 Frank Aluisio, MD Total Joint Specialist [facility] [place] [address] [place], [place] [address] [phone]  TOTAL KNEE REPLACEMENT POSTOPERATIVE DIRECTIONS    Knee Rehabilitation, Guidelines Following Surgery  Results after knee surgery are often greatly improved when you follow the exercise, range of motion and muscle strengthening exercises prescribed by your doctor. Safety measures are also important to protect the knee from further injury. If any of these exercises cause you to have increased pain or swelling in your knee joint, decrease the amount until you are comfortable again and slowly increase them. If you have problems or questions, call your caregiver or physical therapist for advice.   HOME CARE INSTRUCTIONS  . Remove items at home which could result in a fall. This includes throw rugs or furniture in walking pathways.  . ICE to the affected knee as much as tolerated. Icing helps control swelling. If the swelling is well controlled you will be more comfortable and rehab easier. Continue to use ice on the knee for pain and swelling from surgery. You may notice swelling that will progress down to the foot and ankle. This is normal after surgery. Elevate the leg when you are not up walking on it.    . Continue to use the breathing machine which will help keep your temperature down. It is common for your temperature to cycle up and down following surgery, especially at night when you are not up moving around and exerting yourself. The breathing machine keeps your lungs expanded and your temperature down. . Do not place pillow under the operative knee, focus on keeping the knee straight while resting  DIET You may resume your previous home diet once you are discharged from the hospital.  DRESSING / WOUND CARE / SHOWERING . Keep your bulky bandage on for 2 days. On the third post-operative day you may remove the Ace bandage and gauze. There is a  waterproof adhesive bandage on your skin which will stay in place until your first follow-up appointment. Once you remove this you will not need to place another bandage . You may begin showering 3 days following surgery, but do not submerge the incision under water.  ACTIVITY For the first 5 days, the key is rest and control of pain and swelling . Do your home exercises twice a day starting on post-operative day 3. On the days you go to physical therapy, just do the home exercises once that day. . You should rest, ice and elevate the leg for 50 minutes out of every hour. Get up and walk/stretch for 10 minutes per hour. After 5 days you can increase your activity slowly as tolerated. . Walk with your walker as instructed. Use the walker until you are comfortable transitioning to a cane. Walk with the cane in the opposite hand of the operative leg. You may discontinue the cane once you are comfortable and walking steadily. . Avoid periods of inactivity such as sitting longer than an hour when not asleep. This helps prevent blood clots.  . You may discontinue the knee immobilizer once you are able to perform a straight leg raise while lying down. . You may resume a sexual relationship in one month or when given the OK by your doctor.  . You may return to work once you are cleared by your doctor.  . Do not drive a car for 6 weeks or until released by your surgeon.  . Do not drive while taking narcotics.  TED HOSE   STOCKINGS Wear the elastic stockings on both legs for three weeks following surgery during the day. You may remove them at night for sleeping.  WEIGHT BEARING Weight bearing as tolerated with assist device (walker, cane, etc) as directed, use it as long as suggested by your surgeon or therapist, typically at least 4-6 weeks.  POSTOPERATIVE CONSTIPATION PROTOCOL Constipation - defined medically as fewer than three stools per week and severe constipation as less than one stool per  week.  One of the most common issues patients have following surgery is constipation.  Even if you have a regular bowel pattern at home, your normal regimen is likely to be disrupted due to multiple reasons following surgery.  Combination of anesthesia, postoperative narcotics, change in appetite and fluid intake all can affect your bowels.  In order to avoid complications following surgery, here are some recommendations in order to help you during your recovery period.  . Colace (docusate) - Pick up an over-the-counter form of Colace or another stool softener and take twice a day as long as you are requiring postoperative pain medications.  Take with a full glass of water daily.  If you experience loose stools or diarrhea, hold the colace until you stool forms back up. If your symptoms do not get better within 1 week or if they get worse, check with your doctor. . Dulcolax (bisacodyl) - Pick up over-the-counter and take as directed by the product packaging as needed to assist with the movement of your bowels.  Take with a full glass of water.  Use this product as needed if not relieved by Colace only.  . MiraLax (polyethylene glycol) - Pick up over-the-counter to have on hand. MiraLax is a solution that will increase the amount of water in your bowels to assist with bowel movements.  Take as directed and can mix with a glass of water, juice, soda, coffee, or tea. Take if you go more than two days without a movement. Do not use MiraLax more than once per day. Call your doctor if you are still constipated or irregular after using this medication for 7 days in a row.  If you continue to have problems with postoperative constipation, please contact the office for further assistance and recommendations.  If you experience "the worst abdominal pain ever" or develop nausea or vomiting, please contact the office immediatly for further recommendations for treatment.  ITCHING If you experience itching with your  medications, try taking only a single pain pill, or even half a pain pill at a time.  You can also use Benadryl over the counter for itching or also to help with sleep.   MEDICATIONS See your medication summary on the "After Visit Summary" that the nursing staff will review with you prior to discharge.  You may have some home medications which will be placed on hold until you complete the course of blood thinner medication.  It is important for you to complete the blood thinner medication as prescribed by your surgeon.  Continue your approved medications as instructed at time of discharge.  PRECAUTIONS . If you experience chest pain or shortness of breath - call 911 immediately for transfer to the hospital emergency department.  . If you develop a fever greater that 101 F, purulent drainage from wound, increased redness or drainage from wound, foul odor from the wound/dressing, or calf pain - CONTACT YOUR SURGEON.                                                     FOLLOW-UP APPOINTMENTS Make sure you keep all of your appointments after your operation with your surgeon and caregivers. You should call the office at the above phone number and make an appointment for approximately two weeks after the date of your surgery or on the date instructed by your surgeon outlined in the "After Visit Summary".  RANGE OF MOTION AND STRENGTHENING EXERCISES  Rehabilitation of the knee is important following a knee injury or an operation. After just a few days of immobilization, the muscles of the thigh which control the knee become weakened and shrink (atrophy). Knee exercises are designed to build up the tone and strength of the thigh muscles and to improve knee motion. Often times heat used for twenty to thirty minutes before working out will loosen up your tissues and help with improving the range of motion but do not use heat for the first two weeks following surgery. These exercises can be done on a training  (exercise) mat, on the floor, on a table or on a bed. Use what ever works the best and is most comfortable for you Knee exercises include:  . Leg Lifts - While your knee is still immobilized in a splint or cast, you can do straight leg raises. Lift the leg to 60 degrees, hold for 3 sec, and slowly lower the leg. Repeat 10-20 times 2-3 times daily. Perform this exercise against resistance later as your knee gets better.  . Quad and Hamstring Sets - Tighten up the muscle on the front of the thigh (Quad) and hold for 5-10 sec. Repeat this 10-20 times hourly. Hamstring sets are done by pushing the foot backward against an object and holding for 5-10 sec. Repeat as with quad sets.   Leg Slides: Lying on your back, slowly slide your foot toward your buttocks, bending your knee up off the floor (only go as far as is comfortable). Then slowly slide your foot back down until your leg is flat on the floor again.  Angel Wings: Lying on your back spread your legs to the side as far apart as you can without causing discomfort.  A rehabilitation program following serious knee injuries can speed recovery and prevent re-injury in the future due to weakened muscles. Contact your doctor or a physical therapist for more information on knee rehabilitation.   IF YOU ARE TRANSFERRED TO A SKILLED REHAB FACILITY If the patient is transferred to a skilled rehab facility following release from the hospital, a list of the current medications will be sent to the facility for the patient to continue.  When discharged from the skilled rehab facility, please have the facility set up the patient's Home Health Physical Therapy prior to being released. Also, the skilled facility will be responsible for providing the patient with their medications at time of release from the facility to include their pain medication, the muscle relaxants, and their blood thinner medication. If the patient is still at the rehab facility at time of the two  week follow up appointment, the skilled rehab facility will also need to assist the patient in arranging follow up appointment in our office and any transportation needs.  MAKE SURE YOU:  . Understand these instructions.  . Get help right away if you are not doing well or get worse.   DENTAL ANTIBIOTICS:  In most cases prophylactic antibiotics for Dental procdeures after total joint surgery are not necessary.  Exceptions are as follows:  1. History of prior total joint infection  2. Severely immunocompromised (  Organ Transplant, cancer chemotherapy, Rheumatoid biologic meds such as Humera)  3. Poorly controlled diabetes (A1C &gt; 8.0, blood glucose over 200)  If you have one of these conditions, contact your surgeon for an antibiotic prescription, prior to your dental procedure.    Pick up stool softner and laxative for home use following surgery while on pain medications. Do not submerge incision under water. Please use good hand washing techniques while changing dressing each day. May shower starting three days after surgery. Please use a clean towel to pat the incision dry following showers. Continue to use ice for pain and swelling after surgery. Do not use any lotions or creams on the incision until instructed by your surgeon.  

## 2019-10-13 ENCOUNTER — Encounter (HOSPITAL_COMMUNITY): Payer: Self-pay | Admitting: Orthopedic Surgery

## 2019-10-13 DIAGNOSIS — M1711 Unilateral primary osteoarthritis, right knee: Secondary | ICD-10-CM | POA: Diagnosis not present

## 2019-10-13 LAB — CBC
HCT: 39.2 % (ref 36.0–46.0)
Hemoglobin: 12.2 g/dL (ref 12.0–15.0)
MCH: 29.4 pg (ref 26.0–34.0)
MCHC: 31.1 g/dL (ref 30.0–36.0)
MCV: 94.5 fL (ref 80.0–100.0)
Platelets: 259 10*3/uL (ref 150–400)
RBC: 4.15 MIL/uL (ref 3.87–5.11)
RDW: 13.7 % (ref 11.5–15.5)
WBC: 12 10*3/uL — ABNORMAL HIGH (ref 4.0–10.5)
nRBC: 0 % (ref 0.0–0.2)

## 2019-10-13 LAB — BASIC METABOLIC PANEL
Anion gap: 11 (ref 5–15)
BUN: 20 mg/dL (ref 8–23)
CO2: 23 mmol/L (ref 22–32)
Calcium: 8.7 mg/dL — ABNORMAL LOW (ref 8.9–10.3)
Chloride: 100 mmol/L (ref 98–111)
Creatinine, Ser: 1.12 mg/dL — ABNORMAL HIGH (ref 0.44–1.00)
GFR calc Af Amer: 52 mL/min — ABNORMAL LOW (ref >60)
GFR calc non Af Amer: 45 mL/min — ABNORMAL LOW (ref >60)
Glucose, Bld: 191 mg/dL — ABNORMAL HIGH (ref 70–99)
Potassium: 4.6 mmol/L (ref 3.5–5.1)
Sodium: 134 mmol/L — ABNORMAL LOW (ref 135–145)

## 2019-10-13 MED ORDER — PROPOFOL 500 MG/50ML IV EMUL
INTRAVENOUS | Status: AC
  Filled 2019-10-13: qty 50

## 2019-10-13 MED ORDER — OXYCODONE HCL 5 MG PO TABS: 5.0000 mg | ORAL_TABLET | Freq: Four times a day (QID) | ORAL | 0 refills | Status: DC | PRN

## 2019-10-13 MED ORDER — TRAMADOL HCL 50 MG PO TABS: 50.0000 mg | ORAL_TABLET | Freq: Four times a day (QID) | ORAL | 0 refills | Status: DC | PRN

## 2019-10-13 MED ORDER — METHOCARBAMOL 500 MG PO TABS: 500.0000 mg | ORAL_TABLET | Freq: Four times a day (QID) | ORAL | 0 refills | Status: DC | PRN

## 2019-10-13 NOTE — Progress Notes (Signed)
Patient discharged to home w/ husband. Given all belongings, instructions. Verbalized understanding of all instructions. Escorted to pov via w/c. 

## 2019-10-13 NOTE — Progress Notes (Signed)
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    PT Comments    POP #1 am session General bed mobility comments: demonstarted and instructed how to use belt to self assist.  General transfer comment: 25% VC's on proper hand placement and safety with turns.  Also assisted with a toilet transfer. General Gait Details: 25% VC's on proper walker to self distance, safety with turns and upright posture.  Assisted with amb to and from bathroom.  No c/o nausea this session.Then returned to room to perform some TE's following HEP handout.  Instructed on proper tech, freq as well as use of ICE.  Pt will need another PT session to complete HEP and practice one step to enter her home.    Follow Up Recommendations  Follow surgeon's recommendation for DC plan and follow-up therapies     Equipment Recommendations  None recommended by PT    Recommendations for Other Services       Precautions / Restrictions Precautions Precautions: Knee Precaution Comments: reviewed no pillow under knee Restrictions Weight Bearing Restrictions: No Other Position/Activity Restrictions: WBAT    Mobility  Bed Mobility Overal bed mobility: Needs Assistance Bed Mobility: Supine to Sit     Supine to sit: Supervision     General bed mobility comments: demonstarted and instructed how to use belt to self assist  Transfers Overall transfer level: Needs assistance Equipment used: Rolling walker (2 wheeled) Transfers: Sit to/from [employer] Transfers Sit to Stand: Supervision Stand pivot transfers: Supervision;Min guard       General transfer comment: 25% VC's on proper hand placement and safety with turns.  Also assisted with a toilet transfer.  Ambulation/Gait Ambulation/Gait assistance: Min guard;Min assist Gait Distance  (Feet): 25 Feet Assistive device: Rolling walker (2 wheeled) Gait Pattern/deviations: Step-to pattern;Decreased stance time - right Gait velocity: decreased   General Gait Details: 25% VC's on proper walker to self distance, safety with turns and upright posture.  Assisted with amb to and from bathroom.  No c/o nausea this session.   Stairs             Wheelchair Mobility    Modified Rankin (Stroke Patients Only)       Balance                                            Cognition Arousal/Alertness: Awake/alert Behavior During Therapy: WFL for tasks assessed/performed Overall Cognitive Status: Within Functional Limits for tasks assessed                                        Exercises   Total Knee Replacement TE's following HEP handout 10 reps B LE ankle pumps 05 reps towel squeezes 05 reps knee presses 05 reps heel slides  05 reps SAQ's 05 reps SLR's 05 reps ABD Educated on use of gait belt to assist with TE's Followed by ICE     General Comments        Pertinent Vitals/Pain Pain Assessment: 0-10 Pain Score: 6  Pain Location: R knee Pain Descriptors / Indicators: Tender;Tightness;Grimacing;Operative site guarding Pain Intervention(s): Monitored during session;Premedicated before  session;Repositioned;Ice applied    Home Living                      Prior Function            PT Goals (current goals can now be found in the care plan section) Progress towards PT goals: Progressing toward goals    Frequency    7X/week      PT Plan Current plan remains appropriate    Co-evaluation              AM-PAC PT "6 Clicks" Mobility   Outcome Measure  Help needed turning from your back to your side while in a flat bed without using bedrails?: A Little Help needed moving from lying on your back to sitting on the side of a flat bed without using bedrails?: A Little Help needed moving to and from a bed to  a chair (including a wheelchair)?: A Little Help needed standing up from a chair using your arms (e.g., wheelchair or bedside chair)?: A Little Help needed to walk in hospital room?: A Little Help needed climbing 3-5 steps with a railing? : A Little 6 Click Score: 18    End of Session Equipment Utilized During Treatment: Gait belt Activity Tolerance: Patient limited by fatigue Patient left: in chair;with call bell/phone within reach;with chair alarm set Nurse Communication: Mobility status PT Visit Diagnosis: Other abnormalities of gait and mobility (R26.89)     Time: 8938-1017 PT Time Calculation (min) (ACUTE ONLY): 28 min  Charges:  $Gait Training: 8-22 mins $Therapeutic Exercise: 8-22 mins                     {Ariana Cavenaugh  PTA Acute  Rehabilitation Services Pager      [phone] Office      [phone]

## 2019-10-13 NOTE — TOC Transition Note (Signed)
Transition of Care [facility]) - CM/SW Discharge Note   Patient Details  Name: Rhonda [name] MRN: [id number] Date of Birth: Aug 25, 1932  [facility]) CM/SW Contact:  Lia Hopping, [place] Phone Number: 10/13/2019, 11:01 AM   Clinical Narrative:    Therapy Plan: [facility] and 3 in 1   Final next level of care: OP Rehab Barriers to Discharge: No Barriers Identified   Patient Goals and CMS Choice     Choice offered to / list presented to : NA  Discharge Placement                       Discharge Plan and Services                DME Arranged: N/A DME Agency: NA       HH Arranged: NA HH Agency: NA        Social Determinants of Health (SDOH) Interventions     Readmission Risk Interventions No flowsheet data found.

## 2019-10-13 NOTE — Progress Notes (Signed)
° °  Subjective: 1 Day Post-Op Procedure(s) (LRB): TOTAL KNEE ARTHROPLASTY (Right) Patient reports pain as mild.   Patient seen in rounds by Dr. Wynelle Link. Patient is well, and has had no acute complaints or problems other than pain in the right knee. No issues overnight. Denies chest pain or SOB. Foley catheter removed this AM. We will continue therapy today.   Objective: Vital signs in last 24 hours: Temp:  [97.4 F (36.3 C)-99.1 F (37.3 C)] 98.1 F (36.7 C) (06/22 0536) Pulse Rate:  [63-81] 72 (06/22 0536) Resp:  [13-24] 18 (06/22 0536) BP: (103-146)/(56-91) 123/77 (06/22 0536) SpO2:  [95 %-100 %] 95 % (06/22 0536) Weight:  [77.6 kg] 77.6 kg (06/21 1036)  Intake/Output from previous day:  Intake/Output Summary (Last 24 hours) at 10/13/2019 0707 Last data filed at 10/13/2019 0600 Gross per 24 hour  Intake 3041.56 ml  Output 1720 ml  Net 1321.56 ml     Intake/Output this shift: No intake/output data recorded.  Labs: Recent Labs    10/13/19 0248  HGB 12.2   Recent Labs    10/13/19 0248  WBC 12.0*  RBC 4.15  HCT 39.2  PLT 259   Recent Labs    10/13/19 0248  NA 134*  K 4.6  CL 100  CO2 23  BUN 20  CREATININE 1.12*  GLUCOSE 191*  CALCIUM 8.7*   No results for input(s): LABPT, INR in the last 72 hours.  Exam: General - Patient is Alert and Oriented Extremity - Neurologically intact Neurovascular intact Sensation intact distally Dorsiflexion/Plantar flexion intact Dressing - dressing C/D/I Motor Function - intact, moving foot and toes well on exam.   Past Medical History:  Diagnosis Date   Arthritis    Atrial fibrillation ([place]) 05/2015   Dysrhythmia    afib   Hypertension    Osteopenia    Syncope 04/2014    Assessment/Plan: 1 Day Post-Op Procedure(s) (LRB): TOTAL KNEE ARTHROPLASTY (Right) Principal Problem:   OA (osteoarthritis) of knee Active Problems:   Primary osteoarthritis of right knee  Estimated body mass index is 28.48 kg/m  as calculated from the following:   Height as of this encounter: 5\' 5"  (1.651 m).   Weight as of this encounter: 77.6 kg. Advance diet Up with therapy D/C IV fluids   Patient's anticipated LOS is less than 2 midnights, meeting these requirements: - Younger than 44 - Lives within 1 hour of care - Has a competent adult at home to recover with post-op recover - NO history of  - Chronic pain requiring opiods  - Diabetes  - Coronary Artery Disease  - Heart failure  - Heart attack  - Stroke  - DVT/VTE  - Cardiac arrhythmia  - Respiratory Failure/COPD  - Renal failure  - Anemia  - Advanced Liver disease  DVT Prophylaxis - Eliquis Weight bearing as tolerated. D/C O2 and pulse ox and try on room air. Hemovac pulled without difficulty, will continue therapy today.  Plan is to go Home after hospital stay. Plan for discharge later today once cleared by PT. Scheduled [facility] at [facility]) Follow-up in the office in 2 weeks  Theresa Duty, PA-C Orthopedic Surgery [phone] 10/13/2019, 7:07 AM

## 2019-10-13 NOTE — Progress Notes (Signed)
Physical Therapy Treatment Patient Details Name: Rhonda [name] MRN: [id number] DOB: March 03, 1933 Today's Date: 10/13/2019    History of Present Illness pt s/p R TKA 10/12/2019 with history of L TKA about 12 years ago.    PT Comments    POD # 1 pm session Assisted pt out of bathroom to amb in hallway and practice one step she has to enter her home.  Son was present and observed.  Pt has met therapy goals to D/C however has not yet voided.  RN aware.   Follow Up Recommendations  Follow surgeon's recommendation for DC plan and follow-up therapies     Equipment Recommendations  None recommended by PT    Recommendations for Other Services       Precautions / Restrictions Precautions Precautions: Knee Precaution Comments: reviewed no pillow under knee Restrictions Weight Bearing Restrictions: No Other Position/Activity Restrictions: WBAT    Mobility  Bed Mobility Overal bed mobility: Needs Assistance Bed Mobility: Supine to Sit     Supine to sit: Supervision     General bed mobility comments: Pt OOB in bathroom  Transfers Overall transfer level: Needs assistance Equipment used: Rolling walker (2 wheeled) Transfers: Sit to/from [facility] Sit to Stand: Supervision Stand pivot transfers: Supervision;Min guard       General transfer comment: 25% VC's on proper hand placement and safety with turns.  Also assisted with a toilet transfer.  Ambulation/Gait Ambulation/Gait assistance: Min guard;Min assist Gait Distance (Feet): 55 Feet Assistive device: Rolling walker (2 wheeled) Gait Pattern/deviations: Step-to pattern;Decreased stance time - right Gait velocity: decreased   General Gait Details: 25% VC's on proper walker to self distance, safety with turns and upright posture.  Assisted with amb to and from bathroom.  No c/o nausea this session.   Stairs Stairs: Yes Stairs assistance: Supervision;Min guard Stair Management: No rails;Step to  pattern;Forwards;With walker Number of Stairs: 1 General stair comments: 25% VC's on proper walker placement and sequencing   Wheelchair Mobility    Modified Rankin (Stroke Patients Only)       Balance                                            Cognition Arousal/Alertness: Awake/alert Behavior During Therapy: WFL for tasks assessed/performed Overall Cognitive Status: Within Functional Limits for tasks assessed                                        Exercises      General Comments        Pertinent Vitals/Pain Pain Assessment: 0-10 Pain Score: 6  Pain Location: R knee Pain Descriptors / Indicators: Tender;Tightness;Grimacing;Operative site guarding Pain Intervention(s): Monitored during session;Premedicated before session;Repositioned;Ice applied    Home Living                      Prior Function            PT Goals (current goals can now be found in the care plan section) Progress towards PT goals: Progressing toward goals    Frequency    7X/week      PT Plan Current plan remains appropriate    Co-evaluation              AM-PAC PT "6 Clicks" Mobility  Outcome Measure  Help needed turning from your back to your side while in a flat bed without using bedrails?: A Little Help needed moving from lying on your back to sitting on the side of a flat bed without using bedrails?: A Little Help needed moving to and from a bed to a chair (including a wheelchair)?: A Little Help needed standing up from a chair using your arms (e.g., wheelchair or bedside chair)?: A Little Help needed to walk in hospital room?: A Little Help needed climbing 3-5 steps with a railing? : A Little 6 Click Score: 18    End of Session Equipment Utilized During Treatment: Gait belt Activity Tolerance: Patient limited by fatigue Patient left: in chair;with call bell/phone within reach;with chair alarm set Nurse Communication:  Mobility status PT Visit Diagnosis: Other abnormalities of gait and mobility (R26.89)     Time: 1315-1340 PT Time Calculation (min) (ACUTE ONLY): 25 min  Charges:  $Gait Training: 8-22 mins $Therapeutic Activity: 8-22 mins                     Rica Koyanagi  PTA Acute  Rehabilitation Services Pager      [phone] Office      [phone]

## 2019-10-16 ENCOUNTER — Other Ambulatory Visit: Payer: Self-pay

## 2019-10-16 ENCOUNTER — Ambulatory Visit: Payer: PPO | Attending: Student | Admitting: Physical Therapy

## 2019-10-16 DIAGNOSIS — G8929 Other chronic pain: Secondary | ICD-10-CM

## 2019-10-16 DIAGNOSIS — M25561 Pain in right knee: Secondary | ICD-10-CM | POA: Diagnosis not present

## 2019-10-16 DIAGNOSIS — R600 Localized edema: Secondary | ICD-10-CM

## 2019-10-16 DIAGNOSIS — M6281 Muscle weakness (generalized): Secondary | ICD-10-CM | POA: Diagnosis not present

## 2019-10-16 DIAGNOSIS — M25661 Stiffness of right knee, not elsewhere classified: Secondary | ICD-10-CM | POA: Diagnosis not present

## 2019-10-16 NOTE — Therapy (Signed)
[facility] [place], [place], [address] Phone: [phone]   Fax:  [phone]  Physical Therapy Evaluation  Patient Details  Name: Rhonda [name] MRN: [id number] Date of Birth: 03-24-1933 Referring Provider (PT): Gaynelle Arabian MD   Encounter Date: 10/16/2019   PT End of Session - 10/16/19 1211    Visit Number 1    Number of Visits 12    Date for PT Re-Evaluation 11/13/19    Authorization Type FOTO.    PT Start Time 0900    PT Stop Time 0950    PT Time Calculation (min) 50 min    Activity Tolerance Patient tolerated treatment well    Behavior During Therapy WFL for tasks assessed/performed           Past Medical History:  Diagnosis Date  . Arthritis   . Atrial fibrillation ([place]) 05/2015  . Dysrhythmia    afib  . Hypertension   . Osteopenia   . Syncope 04/2014    Past Surgical History:  Procedure Laterality Date  . BREAST EXCISIONAL BIOPSY Left   . BREAST EXCISIONAL BIOPSY Right   . COLONOSCOPY  2011  . KNEE ARTHROCENTESIS Bilateral   . KNEE ARTHROSCOPY     bil  . POLYPECTOMY  11-01-2009  . ROTATOR CUFF REPAIR    . SHOULDER SURGERY Left   . TOTAL KNEE ARTHROPLASTY Left   . TOTAL KNEE ARTHROPLASTY Right 10/12/2019   Procedure: TOTAL KNEE ARTHROPLASTY;  Surgeon: Gaynelle Arabian, MD;  Location: [facility];  Service: Orthopedics;  Laterality: Right;  65min  . VAGINAL HYSTERECTOMY     twice - vaginal and then oopherectomy    There were no vitals filed for this visit.    Subjective Assessment - 10/16/19 1213    Subjective COVID-19 screen performed prior to patient entering clinic.  The patient presenst tot he clinic today s/p right total knee replacement performed on 10/12/19.  She reports a 10/10 pain-level today.  She is walking with a FWW.  She was prescribed a HEP in the hospital and she was encourgaed to be compliant to performing them.    Pertinent History HTN, osteopenia, left RTC repair.    How long  can you walk comfortably? Around house with a FWW.    Patient Stated Goals Get around without right knee pain.    Currently in Pain? Yes    Pain Score 10-Worst pain ever    Pain Location Knee    Pain Orientation Right    Pain Descriptors / Indicators Aching;Throbbing    Pain Type Surgical pain    Pain Onset In the past 7 days    Pain Frequency Constant    Aggravating Factors  Walking.    Pain Relieving Factors Rest.              [facility] PT Assessment - 10/16/19 0001      Assessment   Medical Diagnosis Right total knee replacement.    Referring Provider (PT) Gaynelle Arabian MD    Onset Date/Surgical Date --   10/12/19 (surgery date).     Precautions   Precaution Comments No ultrasound.      Restrictions   Weight Bearing Restrictions No      Balance Screen   Has the patient fallen in the past 6 months No    Has the patient had a decrease in activity level because of a fear of falling?  Yes    Is the patient reluctant to leave their home  because of a fear of falling?  Yes      [place] residence      Prior Function   Level of Independence Independent      Observation/Other Assessments   Observations Aquacel intact over patient's right anterior knee.    Focus on Therapeutic Outcomes (FOTO)  74% limitation.      Observation/Other Assessments-Edema    Edema Circumferential      Circumferential Edema   Circumferential - Right RT 5 cms > LT.      ROM / Strength   AROM / PROM / Strength AROM;Strength      AROM   Overall AROM Comments Right knee in supine with extension to -14 degrees and passive to -11 degrees with flexion to 70 degrees.      Strength   Overall Strength Comments Right hip flexion 2+/5, right knee 3 to 3+/5.      Palpation   Palpation comment Diffuse tenderness over right anterior knee.      Transfers   Comments Min assist required to patient's right LE for supine to sit to supine transfers.      Ambulation/Gait    Gait Comments Step-to gait pattern with a FWW.                      Objective measurements completed on examination: See above findings.       [place] Adult PT Treatment/Exercise - 10/16/19 0001      Exercises   Exercises Knee/Hip      Knee/Hip Exercises: Aerobic   Nustep Level 1 x 10 minutes monitored and moving seat forward x 1 to increase knee flexion.      Modalities   Modalities Vasopneumatic      Vasopneumatic   Number Minutes Vasopneumatic  10 minutes    Vasopnuematic Location  --   Right knee.   Vasopneumatic Pressure Low                       PT Long Term Goals - 10/16/19 1231      PT LONG TERM GOAL #1   Title Independent with a HEP.    Time 4    Period Weeks    Status New      PT LONG TERM GOAL #2   Title Full right active knee extension in order to normalize gait.    Time 4    Period Weeks    Status New      PT LONG TERM GOAL #3   Title Active knee flexion to 115 degrees+ so the patient can perform functional tasks and do so with pain not > 2-3/10.    Time 4    Period Weeks    Status New      PT LONG TERM GOAL #4   Title Increase right knee strength to a solid 4+/5 to provide good stability for accomplishment of functional activities.    Time 4    Period Weeks    Status New      PT LONG TERM GOAL #5   Title Perform a reciprocating stair gait with one railing with pain not > 2-3/10.    Time 4    Period Weeks    Status New                  Plan - 10/16/19 1221    Clinical Impression Statement The patient presents to OPPt s/p right  total knee replacement performed on 10/12/19.  She currently lack right knee flexion and extension.  She exhibits a moderate amount of edema currently.  She is using a FWW with as step-to gait pattern today.  Her FOTO limitation score is a 74%.  Patient will benefit from skilled physical therapy intervention to address deficits and pain.    Personal Factors and Comorbidities Comorbidity  1;Comorbidity 2    Comorbidities HTN, osteopenia, left RTC repair.    Examination-Activity Limitations Locomotion Level;Other;Transfers    Examination-Participation Restrictions Other    Stability/Clinical Decision Making Stable/Uncomplicated    Clinical Decision Making Low    Rehab Potential Excellent    PT Frequency 3x / week    PT Duration 4 weeks    PT Treatment/Interventions ADLs/Self Care Home Management;Cryotherapy;Electrical Stimulation;Moist Heat;Gait training;Stair training;Functional mobility training;Therapeutic activities;Therapeutic exercise;Neuromuscular re-education;Manual techniques;Patient/family education;Passive range of motion;Vasopneumatic Device    PT Next Visit Plan TKA protocol progression, Nustep, PROM, Vasopneumatic.    Consulted and Agree with Plan of Care Patient           Patient will benefit from skilled therapeutic intervention in order to improve the following deficits and impairments:  Pain, Abnormal gait, Decreased activity tolerance, Decreased strength, Increased edema, Decreased range of motion  Visit Diagnosis: Chronic pain of right knee - Plan: PT plan of care cert/re-cert  Muscle weakness (generalized) - Plan: PT plan of care cert/re-cert  Stiffness of right knee, not elsewhere classified - Plan: PT plan of care cert/re-cert  Localized edema - Plan: PT plan of care cert/re-cert     Problem List Patient Active Problem List   Diagnosis Date Noted  . OA (osteoarthritis) of knee 10/12/2019  . Primary osteoarthritis of right knee 10/12/2019  . Essential hypertension 08/06/2018  . Insomnia 06/19/2018  . Overweight (BMI 25.0-29.9) 09/19/2017  . Low serum vitamin D 05/24/2016  . Osteopenia 05/23/2016  . Atrial fibrillation ([place]) 07/13/2015  . Syncope 05/05/2014    Rhonda [name], [place] MPT 10/16/2019, 12:35 PM  [facility] [place], [place], [address] Phone: [phone]   Fax:   [phone]  Name: Rhonda [name] MRN: [id number] Date of Birth: 02/18/1933

## 2019-10-19 ENCOUNTER — Other Ambulatory Visit: Payer: Self-pay

## 2019-10-19 ENCOUNTER — Ambulatory Visit: Payer: PPO | Admitting: Physical Therapy

## 2019-10-19 DIAGNOSIS — M6281 Muscle weakness (generalized): Secondary | ICD-10-CM

## 2019-10-19 DIAGNOSIS — M25561 Pain in right knee: Secondary | ICD-10-CM | POA: Diagnosis not present

## 2019-10-19 DIAGNOSIS — G8929 Other chronic pain: Secondary | ICD-10-CM

## 2019-10-19 DIAGNOSIS — R600 Localized edema: Secondary | ICD-10-CM

## 2019-10-19 DIAGNOSIS — M25661 Stiffness of right knee, not elsewhere classified: Secondary | ICD-10-CM

## 2019-10-19 NOTE — Therapy (Signed)
[facility] [place], [place], [address] Phone: [phone]   Fax:  [phone]  Physical Therapy Treatment  Patient Details  Name: Rhonda [name] MRN: [id number] Date of Birth: 1933-02-22 Referring Provider (PT): Gaynelle Arabian MD   Encounter Date: 10/19/2019   PT End of Session - 10/19/19 1331    Visit Number 2    Number of Visits 12    Date for PT Re-Evaluation 11/13/19    Authorization Type FOTO.    PT Start Time 0100    PT Stop Time 0147    PT Time Calculation (min) 47 min    Activity Tolerance Patient tolerated treatment well    Behavior During Therapy WFL for tasks assessed/performed           Past Medical History:  Diagnosis Date  . Arthritis   . Atrial fibrillation ([place]) 05/2015  . Dysrhythmia    afib  . Hypertension   . Osteopenia   . Syncope 04/2014    Past Surgical History:  Procedure Laterality Date  . BREAST EXCISIONAL BIOPSY Left   . BREAST EXCISIONAL BIOPSY Right   . COLONOSCOPY  2011  . KNEE ARTHROCENTESIS Bilateral   . KNEE ARTHROSCOPY     bil  . POLYPECTOMY  11-01-2009  . ROTATOR CUFF REPAIR    . SHOULDER SURGERY Left   . TOTAL KNEE ARTHROPLASTY Left   . TOTAL KNEE ARTHROPLASTY Right 10/12/2019   Procedure: TOTAL KNEE ARTHROPLASTY;  Surgeon: Gaynelle Arabian, MD;  Location: [facility];  Service: Orthopedics;  Laterality: Right;  33min  . VAGINAL HYSTERECTOMY     twice - vaginal and then oopherectomy    There were no vitals filed for this visit.   Subjective Assessment - 10/19/19 1333    Subjective COVID-19 screen performed prior to patient entering clinic.  No new complaints.    Pertinent History HTN, osteopenia, left RTC repair.    How long can you walk comfortably? Around house with a FWW.    Patient Stated Goals Get around without right knee pain.    Currently in Pain? Yes    Pain Score 7     Pain Location Knee    Pain Orientation Right    Pain Descriptors / Indicators  Aching;Throbbing   "Stiff."   Pain Onset In the past 7 days              [facility] PT Assessment - 10/19/19 0001      AROM   Overall AROM Comments Passive right knee flexion to 90 degrees in supine today.                         [place] Adult PT Treatment/Exercise - 10/19/19 0001      Exercises   Exercises Knee/Hip      Knee/Hip Exercises: Aerobic   Nustep Level 1 x 15 minutes monitored and moving forward x 2 to increase knee flexion.      Modalities   Modalities Vasopneumatic      Vasopneumatic   Number Minutes Vasopneumatic  15 minutes    Vasopnuematic Location  --   Right knee.   Vasopneumatic Pressure Low      Manual Therapy   Manual Therapy Passive ROM    Passive ROM In supine:  Gentle right knee passive range of motion into flexion and extension.  PT Long Term Goals - 10/16/19 1231      PT LONG TERM GOAL #1   Title Independent with a HEP.    Time 4    Period Weeks    Status New      PT LONG TERM GOAL #2   Title Full right active knee extension in order to normalize gait.    Time 4    Period Weeks    Status New      PT LONG TERM GOAL #3   Title Active knee flexion to 115 degrees+ so the patient can perform functional tasks and do so with pain not > 2-3/10.    Time 4    Period Weeks    Status New      PT LONG TERM GOAL #4   Title Increase right knee strength to a solid 4+/5 to provide good stability for accomplishment of functional activities.    Time 4    Period Weeks    Status New      PT LONG TERM GOAL #5   Title Perform a reciprocating stair gait with one railing with pain not > 2-3/10.    Time 4    Period Weeks    Status New                 Plan - 10/19/19 1338    Clinical Impression Statement Patient did well today withincreased time on Nustep and passive right knee range of motion today.  Right knee passiev flexion in supine to 90 degrees today.           Patient will benefit from  skilled therapeutic intervention in order to improve the following deficits and impairments:     Visit Diagnosis: Chronic pain of right knee  Muscle weakness (generalized)  Stiffness of right knee, not elsewhere classified  Localized edema     Problem List Patient Active Problem List   Diagnosis Date Noted  . OA (osteoarthritis) of knee 10/12/2019  . Primary osteoarthritis of right knee 10/12/2019  . Essential hypertension 08/06/2018  . Insomnia 06/19/2018  . Overweight (BMI 25.0-29.9) 09/19/2017  . Low serum vitamin D 05/24/2016  . Osteopenia 05/23/2016  . Atrial fibrillation ([place]) 07/13/2015  . Syncope 05/05/2014    Rhonda [name], [place] MPT 10/19/2019, 1:47 PM  [facility] [place], [place], [address] Phone: [phone]   Fax:  [phone]  Name: Rhonda [name] MRN: [id number] Date of Birth: 29-Sep-1932

## 2019-10-21 ENCOUNTER — Encounter: Payer: Self-pay | Admitting: Physical Therapy

## 2019-10-21 ENCOUNTER — Ambulatory Visit: Payer: PPO | Admitting: Physical Therapy

## 2019-10-21 ENCOUNTER — Other Ambulatory Visit: Payer: Self-pay

## 2019-10-21 DIAGNOSIS — M6281 Muscle weakness (generalized): Secondary | ICD-10-CM

## 2019-10-21 DIAGNOSIS — R600 Localized edema: Secondary | ICD-10-CM

## 2019-10-21 DIAGNOSIS — M25661 Stiffness of right knee, not elsewhere classified: Secondary | ICD-10-CM

## 2019-10-21 DIAGNOSIS — G8929 Other chronic pain: Secondary | ICD-10-CM

## 2019-10-21 DIAGNOSIS — M25561 Pain in right knee: Secondary | ICD-10-CM | POA: Diagnosis not present

## 2019-10-21 NOTE — Therapy (Signed)
[facility] [place], [place], [address] Phone: [phone]   Fax:  [phone]  Physical Therapy Treatment  Patient Details  Name: Rhonda [name] MRN: [id number] Date of Birth: Aug 23, 1932 Referring Provider (PT): Gaynelle Arabian MD   Encounter Date: 10/21/2019   PT End of Session - 10/21/19 1151    Visit Number 3    Number of Visits 12    Date for PT Re-Evaluation 11/13/19    Authorization Type FOTO.    PT Start Time 1115    PT Stop Time 1201    PT Time Calculation (min) 46 min    Activity Tolerance Patient tolerated treatment well    Behavior During Therapy WFL for tasks assessed/performed           Past Medical History:  Diagnosis Date  . Arthritis   . Atrial fibrillation ([place]) 05/2015  . Dysrhythmia    afib  . Hypertension   . Osteopenia   . Syncope 04/2014    Past Surgical History:  Procedure Laterality Date  . BREAST EXCISIONAL BIOPSY Left   . BREAST EXCISIONAL BIOPSY Right   . COLONOSCOPY  2011  . KNEE ARTHROCENTESIS Bilateral   . KNEE ARTHROSCOPY     bil  . POLYPECTOMY  11-01-2009  . ROTATOR CUFF REPAIR    . SHOULDER SURGERY Left   . TOTAL KNEE ARTHROPLASTY Left   . TOTAL KNEE ARTHROPLASTY Right 10/12/2019   Procedure: TOTAL KNEE ARTHROPLASTY;  Surgeon: Gaynelle Arabian, MD;  Location: [facility];  Service: Orthopedics;  Laterality: Right;  75min  . VAGINAL HYSTERECTOMY     twice - vaginal and then oopherectomy    There were no vitals filed for this visit.   Subjective Assessment - 10/21/19 1139    Subjective COVID-19 screen performed prior to patient entering clinic.  Getting better.    Pertinent History HTN, osteopenia, left RTC repair.    How long can you walk comfortably? Around house with a FWW.    Patient Stated Goals Get around without right knee pain.    Currently in Pain? Yes    Pain Score 6     Pain Location Knee    Pain Orientation Right    Pain Descriptors / Indicators  Aching;Throbbing    Pain Type Surgical pain                             [facility] Adult PT Treatment/Exercise - 10/21/19 0001      Exercises   Exercises Knee/Hip      Knee/Hip Exercises: Supine   Short Arc Quad Sets Limitations 15 minutes facilitated with VMS to right quads with 10 sec extension holds f/b a 10 sec rest.      Vasopneumatic   Number Minutes Vasopneumatic  15 minutes    Vasopnuematic Location  --   Right knee.   Vasopneumatic Pressure Low      Manual Therapy   Manual Therapy Passive ROM    Passive ROM In supine passive right knee flexion and extension stretching.x 8 minutes.                       PT Long Term Goals - 10/16/19 1231      PT LONG TERM GOAL #1   Title Independent with a HEP.    Time 4    Period Weeks    Status New      PT LONG  TERM GOAL #2   Title Full right active knee extension in order to normalize gait.    Time 4    Period Weeks    Status New      PT LONG TERM GOAL #3   Title Active knee flexion to 115 degrees+ so the patient can perform functional tasks and do so with pain not > 2-3/10.    Time 4    Period Weeks    Status New      PT LONG TERM GOAL #4   Title Increase right knee strength to a solid 4+/5 to provide good stability for accomplishment of functional activities.    Time 4    Period Weeks    Status New      PT LONG TERM GOAL #5   Title Perform a reciprocating stair gait with one railing with pain not > 2-3/10.    Time 4    Period Weeks    Status New                 Plan - 10/21/19 1144    Clinical Impression Statement Very good job today with right SAQ's faciliated with VMS to quadriceps.    Personal Factors and Comorbidities Comorbidity 1;Comorbidity 2    Comorbidities HTN, osteopenia, left RTC repair.    Examination-Activity Limitations Locomotion Level;Other;Transfers    Examination-Participation Restrictions Other    Stability/Clinical Decision Making Stable/Uncomplicated      Rehab Potential Excellent    PT Frequency 3x / week    PT Duration 4 weeks    PT Treatment/Interventions ADLs/Self Care Home Management;Cryotherapy;Electrical Stimulation;Moist Heat;Gait training;Stair training;Functional mobility training;Therapeutic activities;Therapeutic exercise;Neuromuscular re-education;Manual techniques;Patient/family education;Passive range of motion;Vasopneumatic Device    PT Next Visit Plan TKA protocol progression, Nustep, PROM, Vasopneumatic.    Consulted and Agree with Plan of Care Patient           Patient will benefit from skilled therapeutic intervention in order to improve the following deficits and impairments:  Pain, Abnormal gait, Decreased activity tolerance, Decreased strength, Increased edema, Decreased range of motion  Visit Diagnosis: Chronic pain of right knee  Muscle weakness (generalized)  Stiffness of right knee, not elsewhere classified  Localized edema     Problem List Patient Active Problem List   Diagnosis Date Noted  . OA (osteoarthritis) of knee 10/12/2019  . Primary osteoarthritis of right knee 10/12/2019  . Essential hypertension 08/06/2018  . Insomnia 06/19/2018  . Overweight (BMI 25.0-29.9) 09/19/2017  . Low serum vitamin D 05/24/2016  . Osteopenia 05/23/2016  . Atrial fibrillation ([place]) 07/13/2015  . Syncope 05/05/2014    Antonious Omahoney, [place] MPT 10/21/2019, 12:01 PM  [facility] [place], [place], [address] Phone: [phone]   Fax:  [phone]  Name: Rhonda [name] MRN: [id number] Date of Birth: [birth year]

## 2019-10-23 ENCOUNTER — Other Ambulatory Visit: Payer: Self-pay

## 2019-10-23 ENCOUNTER — Encounter: Payer: Self-pay | Admitting: Physical Therapy

## 2019-10-23 ENCOUNTER — Ambulatory Visit: Payer: PPO | Attending: Student | Admitting: Physical Therapy

## 2019-10-23 DIAGNOSIS — M25561 Pain in right knee: Secondary | ICD-10-CM | POA: Insufficient documentation

## 2019-10-23 DIAGNOSIS — M25661 Stiffness of right knee, not elsewhere classified: Secondary | ICD-10-CM | POA: Diagnosis not present

## 2019-10-23 DIAGNOSIS — R600 Localized edema: Secondary | ICD-10-CM

## 2019-10-23 DIAGNOSIS — M6281 Muscle weakness (generalized): Secondary | ICD-10-CM | POA: Insufficient documentation

## 2019-10-23 DIAGNOSIS — G8929 Other chronic pain: Secondary | ICD-10-CM | POA: Diagnosis not present

## 2019-10-23 NOTE — Therapy (Signed)
[facility] [place], [place], [address] Phone: [phone]   Fax:  [phone]  Physical Therapy Treatment  Patient Details  Name: Rhonda [name] MRN: [id number] Date of Birth: Jun 06, 1932 Referring Provider (PT): Gaynelle Arabian MD   Encounter Date: 10/23/2019   PT End of Session - 10/23/19 1039    Visit Number 4    Number of Visits 12    Date for PT Re-Evaluation 11/13/19    Authorization Type FOTO.    PT Start Time 1032    PT Stop Time 1120    PT Time Calculation (min) 48 min    Equipment Utilized During Treatment Other (comment)   FWW   Activity Tolerance Patient tolerated treatment well    Behavior During Therapy WFL for tasks assessed/performed           Past Medical History:  Diagnosis Date  . Arthritis   . Atrial fibrillation ([place]) 05/2015  . Dysrhythmia    afib  . Hypertension   . Osteopenia   . Syncope 04/2014    Past Surgical History:  Procedure Laterality Date  . BREAST EXCISIONAL BIOPSY Left   . BREAST EXCISIONAL BIOPSY Right   . COLONOSCOPY  2011  . KNEE ARTHROCENTESIS Bilateral   . KNEE ARTHROSCOPY     bil  . POLYPECTOMY  11-01-2009  . ROTATOR CUFF REPAIR    . SHOULDER SURGERY Left   . TOTAL KNEE ARTHROPLASTY Left   . TOTAL KNEE ARTHROPLASTY Right 10/12/2019   Procedure: TOTAL KNEE ARTHROPLASTY;  Surgeon: Gaynelle Arabian, MD;  Location: [facility];  Service: Orthopedics;  Laterality: Right;  76min  . VAGINAL HYSTERECTOMY     twice - vaginal and then oopherectomy    There were no vitals filed for this visit.   Subjective Assessment - 10/23/19 1037    Subjective COVID-19 screen performed prior to patient entering clinic. Reports her knee is hurting a little more today.    Pertinent History HTN, osteopenia, left RTC repair.    How long can you walk comfortably? Around house with a FWW.    Patient Stated Goals Get around without right knee pain.    Currently in Pain? Yes    Pain Score 6      Pain Location Knee    Pain Orientation Right    Pain Descriptors / Indicators Other (Comment)   Stiff   Pain Type Surgical pain    Pain Onset 1 to 4 weeks ago    Pain Frequency Constant              [facility] PT Assessment - 10/23/19 0001      Assessment   Medical Diagnosis Right total knee replacement.    Referring Provider (PT) Gaynelle Arabian MD    Onset Date/Surgical Date 10/12/19    Next MD Visit 10/28/2019      Precautions   Precaution Comments No ultrasound.      Restrictions   Weight Bearing Restrictions No                         [facility] Adult PT Treatment/Exercise - 10/23/19 0001      Knee/Hip Exercises: Aerobic   Nustep L1, seat 9-8 x10 min      Knee/Hip Exercises: Standing   Hip Flexion AROM;Right;10 reps;Knee bent    Forward Lunges Right;15 reps;3 seconds    Rocker Board 3 minutes      Modalities   Modalities Vasopneumatic  Vasopneumatic   Number Minutes Vasopneumatic  15 minutes    Vasopnuematic Location  Knee    Vasopneumatic Pressure Low    Vasopneumatic Temperature  34 for pain/edema      Manual Therapy   Manual Therapy Passive ROM    Passive ROM PROM of R knee into flexion with passive HS stretching 3x20 sec                       PT Long Term Goals - 10/16/19 1231      PT LONG TERM GOAL #1   Title Independent with a HEP.    Time 4    Period Weeks    Status New      PT LONG TERM GOAL #2   Title Full right active knee extension in order to normalize gait.    Time 4    Period Weeks    Status New      PT LONG TERM GOAL #3   Title Active knee flexion to 115 degrees+ so the patient can perform functional tasks and do so with pain not > 2-3/10.    Time 4    Period Weeks    Status New      PT LONG TERM GOAL #4   Title Increase right knee strength to a solid 4+/5 to provide good stability for accomplishment of functional activities.    Time 4    Period Weeks    Status New      PT LONG TERM GOAL #5   Title  Perform a reciprocating stair gait with one railing with pain not > 2-3/10.    Time 4    Period Weeks    Status New                 Plan - 10/23/19 1126    Clinical Impression Statement Patient presented in clinic with reports of more pain upon waking due to stiffness of R knee. Patient guided through light stretching and ROM exercises. Patient also observed ambulating with less R hip/knee flexion during gait but was encouraged by PTA to allow for more hip/knee flexion. Bruising more purple coloration now along medial thigh. Normal vasopneumatic response noted following removal of the modality.    Personal Factors and Comorbidities Comorbidity 1;Comorbidity 2    Comorbidities HTN, osteopenia, left RTC repair.    Examination-Activity Limitations Locomotion Level;Other;Transfers    Examination-Participation Restrictions Other    Stability/Clinical Decision Making Stable/Uncomplicated    Rehab Potential Excellent    PT Frequency 3x / week    PT Duration 4 weeks    PT Treatment/Interventions ADLs/Self Care Home Management;Cryotherapy;Electrical Stimulation;Moist Heat;Gait training;Stair training;Functional mobility training;Therapeutic activities;Therapeutic exercise;Neuromuscular re-education;Manual techniques;Patient/family education;Passive range of motion;Vasopneumatic Device    PT Next Visit Plan TKA protocol progression, Nustep, PROM, Vasopneumatic.    Consulted and Agree with Plan of Care Patient           Patient will benefit from skilled therapeutic intervention in order to improve the following deficits and impairments:  Pain, Abnormal gait, Decreased activity tolerance, Decreased strength, Increased edema, Decreased range of motion  Visit Diagnosis: Chronic pain of right knee  Muscle weakness (generalized)  Stiffness of right knee, not elsewhere classified  Localized edema     Problem List Patient Active Problem List   Diagnosis Date Noted  . OA  (osteoarthritis) of knee 10/12/2019  . Primary osteoarthritis of right knee 10/12/2019  . Essential hypertension 08/06/2018  . Insomnia 06/19/2018  .  Overweight (BMI 25.0-29.9) 09/19/2017  . Low serum vitamin D 05/24/2016  . Osteopenia 05/23/2016  . Atrial fibrillation ([place]) 07/13/2015  . Syncope 05/05/2014    Standley Brooking, PTA 10/23/2019, 11:34 AM  [facility] [address] [place], [place], [address] Phone: [phone]   Fax:  [phone]  Name: Telena Peyser MRN: [id number] Date of Birth: 08-07-1932

## 2019-10-27 ENCOUNTER — Ambulatory Visit: Payer: PPO | Admitting: Physical Therapy

## 2019-10-27 ENCOUNTER — Other Ambulatory Visit: Payer: Self-pay

## 2019-10-27 ENCOUNTER — Encounter: Payer: Self-pay | Admitting: Physical Therapy

## 2019-10-27 DIAGNOSIS — M25661 Stiffness of right knee, not elsewhere classified: Secondary | ICD-10-CM

## 2019-10-27 DIAGNOSIS — R600 Localized edema: Secondary | ICD-10-CM

## 2019-10-27 DIAGNOSIS — G8929 Other chronic pain: Secondary | ICD-10-CM

## 2019-10-27 DIAGNOSIS — M6281 Muscle weakness (generalized): Secondary | ICD-10-CM

## 2019-10-27 DIAGNOSIS — M25561 Pain in right knee: Secondary | ICD-10-CM | POA: Diagnosis not present

## 2019-10-27 NOTE — Therapy (Signed)
[facility] [place], [place], [address] Phone: [phone]   Fax:  [phone]  Physical Therapy Treatment  Patient Details  Name: Rhonda [name] MRN: [id number] Date of Birth: 1932-11-23 Referring Provider (PT): Gaynelle Arabian MD   Encounter Date: 10/27/2019   PT End of Session - 10/27/19 1311    Visit Number 5    Number of Visits 12    Date for PT Re-Evaluation 11/13/19    Authorization Type FOTO.    PT Start Time 1300    PT Stop Time 1348    PT Time Calculation (min) 48 min    Activity Tolerance Patient tolerated treatment well    Behavior During Therapy WFL for tasks assessed/performed           Past Medical History:  Diagnosis Date  . Arthritis   . Atrial fibrillation ([place]) 05/2015  . Dysrhythmia    afib  . Hypertension   . Osteopenia   . Syncope 04/2014    Past Surgical History:  Procedure Laterality Date  . BREAST EXCISIONAL BIOPSY Left   . BREAST EXCISIONAL BIOPSY Right   . COLONOSCOPY  2011  . KNEE ARTHROCENTESIS Bilateral   . KNEE ARTHROSCOPY     bil  . POLYPECTOMY  11-01-2009  . ROTATOR CUFF REPAIR    . SHOULDER SURGERY Left   . TOTAL KNEE ARTHROPLASTY Left   . TOTAL KNEE ARTHROPLASTY Right 10/12/2019   Procedure: TOTAL KNEE ARTHROPLASTY;  Surgeon: Gaynelle Arabian, MD;  Location: [facility];  Service: Orthopedics;  Laterality: Right;  73min  . VAGINAL HYSTERECTOMY     twice - vaginal and then oopherectomy    There were no vitals filed for this visit.   Subjective Assessment - 10/27/19 1310    Subjective COVID-19 screen performed prior to patient entering clinic. Patient reports the knee hurts today, feels like the aquacel dressing is tight. Goes to MD for follow up tomorrow.    Pertinent History HTN, osteopenia, left RTC repair.    How long can you walk comfortably? Around house with a FWW.    Patient Stated Goals Get around without right knee pain.    Currently in Pain? Yes   did not  provide number on pain scale             [facility] PT Assessment - 10/27/19 0001      Assessment   Medical Diagnosis Right total knee replacement.    Referring Provider (PT) Gaynelle Arabian MD    Onset Date/Surgical Date 10/12/19    Next MD Visit 10/28/2019      Precautions   Precaution Comments No ultrasound.      Restrictions   Weight Bearing Restrictions No      ROM / Strength   AROM / PROM / Strength PROM      AROM   AROM Assessment Site Knee    Right/Left Knee Right    Right Knee Extension 7    Right Knee Flexion 70      PROM   PROM Assessment Site Knee    Right/Left Knee Right    Right Knee Extension 5    Right Knee Flexion 86                         [facility] Adult PT Treatment/Exercise - 10/27/19 0001      Exercises   Exercises Knee/Hip      Knee/Hip Exercises: Aerobic   Nustep L1, seat  9-8 x10 min      Knee/Hip Exercises: Seated   Long Arc Quad AROM;Right;2 sets;10 reps    Heel Slides AROM;Right;2 sets;10 reps    Sit to Sand 2 sets;5 reps   elevated plinth     Modalities   Modalities Vasopneumatic      Vasopneumatic   Number Minutes Vasopneumatic  15 minutes    Vasopnuematic Location  Knee    Vasopneumatic Pressure Low    Vasopneumatic Temperature  34 for pain/edema      Manual Therapy   Manual Therapy Passive ROM    Passive ROM PROM into knee flexion and extension with gentle holds at end range; oscillations to prevent muscle guarding                       PT Long Term Goals - 10/27/19 1337      PT LONG TERM GOAL #1   Title Independent with a HEP.    Time 4    Period Weeks    Status Achieved      PT LONG TERM GOAL #2   Title Full right active knee extension in order to normalize gait.    Time 4    Period Weeks    Status On-going      PT LONG TERM GOAL #3   Title Active knee flexion to 115 degrees+ so the patient can perform functional tasks and do so with pain not > 2-3/10.    Time 4    Period Weeks    Status  On-going      PT LONG TERM GOAL #4   Title Increase right knee strength to a solid 4+/5 to provide good stability for accomplishment of functional activities.    Time 4    Period Weeks    Status On-going      PT LONG TERM GOAL #5   Title Perform a reciprocating stair gait with one railing with pain not > 2-3/10.    Time 4    Period Weeks    Status On-going                 Plan - 10/27/19 1340    Clinical Impression Statement Patient responded fairly to therapy session with ongoing pain and tightness in right knee. Patient guided through AROM exercises with good form and technique. Patient with pain at end range PROM and felt as though a cramp was coming in her quad. Goals ongoing at this time. No adverse effects upon removal of modalities.    Personal Factors and Comorbidities Comorbidity 1;Comorbidity 2    Comorbidities HTN, osteopenia, left RTC repair.    Examination-Activity Limitations Locomotion Level;Other;Transfers    Examination-Participation Restrictions Other    Stability/Clinical Decision Making Stable/Uncomplicated    Clinical Decision Making Low    Rehab Potential Excellent    PT Frequency 3x / week    PT Duration 4 weeks    PT Treatment/Interventions ADLs/Self Care Home Management;Cryotherapy;Electrical Stimulation;Moist Heat;Gait training;Stair training;Functional mobility training;Therapeutic activities;Therapeutic exercise;Neuromuscular re-education;Manual techniques;Patient/family education;Passive range of motion;Vasopneumatic Device    PT Next Visit Plan TKA protocol progression, Nustep, PROM, Vasopneumatic.    Consulted and Agree with Plan of Care Patient           Patient will benefit from skilled therapeutic intervention in order to improve the following deficits and impairments:  Pain, Abnormal gait, Decreased activity tolerance, Decreased strength, Increased edema, Decreased range of motion  Visit Diagnosis: Chronic pain of right knee  Muscle  weakness (generalized)  Stiffness of right knee, not elsewhere classified  Localized edema     Problem List Patient Active Problem List   Diagnosis Date Noted  . OA (osteoarthritis) of knee 10/12/2019  . Primary osteoarthritis of right knee 10/12/2019  . Essential hypertension 08/06/2018  . Insomnia 06/19/2018  . Overweight (BMI 25.0-29.9) 09/19/2017  . Low serum vitamin D 05/24/2016  . Osteopenia 05/23/2016  . Atrial fibrillation ([place]) 07/13/2015  . Syncope 05/05/2014    Gabriela Eves, PT, DPT 10/27/2019, 4:12 PM  [facility] [address] [place], [place], [address] Phone: [phone]   Fax:  [phone]  Name: Rhonda [name] MRN: [id number] Date of Birth: 02-03-1933

## 2019-10-29 ENCOUNTER — Ambulatory Visit: Payer: PPO | Admitting: Physical Therapy

## 2019-10-29 ENCOUNTER — Encounter: Payer: Self-pay | Admitting: Physical Therapy

## 2019-10-29 ENCOUNTER — Other Ambulatory Visit: Payer: Self-pay

## 2019-10-29 DIAGNOSIS — R600 Localized edema: Secondary | ICD-10-CM

## 2019-10-29 DIAGNOSIS — M25661 Stiffness of right knee, not elsewhere classified: Secondary | ICD-10-CM

## 2019-10-29 DIAGNOSIS — M25561 Pain in right knee: Secondary | ICD-10-CM | POA: Diagnosis not present

## 2019-10-29 DIAGNOSIS — M6281 Muscle weakness (generalized): Secondary | ICD-10-CM

## 2019-10-29 DIAGNOSIS — G8929 Other chronic pain: Secondary | ICD-10-CM

## 2019-10-29 NOTE — Therapy (Signed)
[facility] [place], [place], [address] Phone: [phone]   Fax:  [phone]  Physical Therapy Treatment  Patient Details  Name: Rhonda [name] MRN: [id number] Date of Birth: 1932/06/23 Referring Provider (PT): Gaynelle Arabian MD   Encounter Date: 10/29/2019   PT End of Session - 10/29/19 1110    Visit Number 6    Number of Visits 12    Date for PT Re-Evaluation 11/13/19    Authorization Type FOTO.    PT Start Time 1117    PT Stop Time 1202    PT Time Calculation (min) 45 min    Equipment Utilized During Treatment Other (comment)   FWW   Activity Tolerance Patient tolerated treatment well    Behavior During Therapy WFL for tasks assessed/performed           Past Medical History:  Diagnosis Date  . Arthritis   . Atrial fibrillation ([place]) 05/2015  . Dysrhythmia    afib  . Hypertension   . Osteopenia   . Syncope 04/2014    Past Surgical History:  Procedure Laterality Date  . BREAST EXCISIONAL BIOPSY Left   . BREAST EXCISIONAL BIOPSY Right   . COLONOSCOPY  2011  . KNEE ARTHROCENTESIS Bilateral   . KNEE ARTHROSCOPY     bil  . POLYPECTOMY  11-01-2009  . ROTATOR CUFF REPAIR    . SHOULDER SURGERY Left   . TOTAL KNEE ARTHROPLASTY Left   . TOTAL KNEE ARTHROPLASTY Right 10/12/2019   Procedure: TOTAL KNEE ARTHROPLASTY;  Surgeon: Gaynelle Arabian, MD;  Location: [facility];  Service: Orthopedics;  Laterality: Right;  53min  . VAGINAL HYSTERECTOMY     twice - vaginal and then oopherectomy    There were no vitals filed for this visit.   Subjective Assessment - 10/29/19 1109    Subjective COVID-19 screen performed prior to patient entering clinic. Patient states that MD was pleased with her progress and her bandage off over her incision.    Pertinent History HTN, osteopenia, left RTC repair.    How long can you walk comfortably? Around house with a FWW.    Patient Stated Goals Get around without right knee pain.     Currently in Pain? Yes    Pain Score 9     Pain Location Knee    Pain Orientation Right    Pain Descriptors / Indicators Sore;Other (Comment)   stiff   Pain Type Surgical pain    Pain Onset 1 to 4 weeks ago    Pain Frequency Constant              [facility] PT Assessment - 10/29/19 0001      Assessment   Medical Diagnosis Right total knee replacement.    Referring Provider (PT) Gaynelle Arabian MD    Onset Date/Surgical Date 10/12/19    Next MD Visit 11/16/2019      Precautions   Precaution Comments No ultrasound.      Restrictions   Weight Bearing Restrictions No                         [facility] Adult PT Treatment/Exercise - 10/29/19 0001      Knee/Hip Exercises: Aerobic   Nustep L3, seat 11-8 x15 min      Knee/Hip Exercises: Standing   Hip Flexion AROM;Right;15 reps;Knee bent    Forward Lunges Right;15 reps;3 seconds    Rocker Board 2 minutes  Modalities   Modalities Vasopneumatic;Electrical Stimulation      Acupuncturist Location R knee    Electrical Stimulation Action IFC    Electrical Stimulation Parameters 80-150 hz x15 min    Electrical Stimulation Goals Pain;Edema      Vasopneumatic   Number Minutes Vasopneumatic  15 minutes    Vasopnuematic Location  Knee    Vasopneumatic Pressure Low    Vasopneumatic Temperature  34 for pain/edema                       PT Long Term Goals - 10/27/19 1337      PT LONG TERM GOAL #1   Title Independent with a HEP.    Time 4    Period Weeks    Status Achieved      PT LONG TERM GOAL #2   Title Full right active knee extension in order to normalize gait.    Time 4    Period Weeks    Status On-going      PT LONG TERM GOAL #3   Title Active knee flexion to 115 degrees+ so the patient can perform functional tasks and do so with pain not > 2-3/10.    Time 4    Period Weeks    Status On-going      PT LONG TERM GOAL #4   Title Increase right knee strength  to a solid 4+/5 to provide good stability for accomplishment of functional activities.    Time 4    Period Weeks    Status On-going      PT LONG TERM GOAL #5   Title Perform a reciprocating stair gait with one railing with pain not > 2-3/10.    Time 4    Period Weeks    Status On-going                 Plan - 10/29/19 1155    Clinical Impression Statement Patient presented in clinic with high level soreness and stiffness of R knee. Patient still unable to sleep well due to discomfort from R knee. Patient progressed while on nustep to improve R knee flexion. Limited with therex due to discomfort of R knee. Incision observed with very minimal scarring and appropriately healing. Normal modalities response noted following removal of the modalities.    Personal Factors and Comorbidities Comorbidity 1;Comorbidity 2    Comorbidities HTN, osteopenia, left RTC repair.    Examination-Activity Limitations Locomotion Level;Other;Transfers    Examination-Participation Restrictions Other    Stability/Clinical Decision Making Stable/Uncomplicated    Rehab Potential Excellent    PT Frequency 3x / week    PT Duration 4 weeks    PT Treatment/Interventions ADLs/Self Care Home Management;Cryotherapy;Electrical Stimulation;Moist Heat;Gait training;Stair training;Functional mobility training;Therapeutic activities;Therapeutic exercise;Neuromuscular re-education;Manual techniques;Patient/family education;Passive range of motion;Vasopneumatic Device    PT Next Visit Plan TKA protocol progression, Nustep, PROM, Vasopneumatic.    Consulted and Agree with Plan of Care Patient           Patient will benefit from skilled therapeutic intervention in order to improve the following deficits and impairments:  Pain, Abnormal gait, Decreased activity tolerance, Decreased strength, Increased edema, Decreased range of motion  Visit Diagnosis: Chronic pain of right knee  Muscle weakness  (generalized)  Stiffness of right knee, not elsewhere classified  Localized edema     Problem List Patient Active Problem List   Diagnosis Date Noted  . OA (osteoarthritis) of knee 10/12/2019  .  Primary osteoarthritis of right knee 10/12/2019  . Essential hypertension 08/06/2018  . Insomnia 06/19/2018  . Overweight (BMI 25.0-29.9) 09/19/2017  . Low serum vitamin D 05/24/2016  . Osteopenia 05/23/2016  . Atrial fibrillation ([place]) 07/13/2015  . Syncope 05/05/2014    Standley Brooking, PTA 10/29/2019, 12:05 PM  [facility] [address] [place], [place], [address] Phone: [phone]   Fax:  [phone]  Name: Rhonda [name] MRN: [id number] Date of Birth: 09-19-1932

## 2019-11-02 ENCOUNTER — Other Ambulatory Visit: Payer: Self-pay | Admitting: Family Medicine

## 2019-11-02 ENCOUNTER — Other Ambulatory Visit: Payer: Self-pay

## 2019-11-02 ENCOUNTER — Encounter: Payer: Self-pay | Admitting: Physical Therapy

## 2019-11-02 ENCOUNTER — Ambulatory Visit: Payer: PPO | Admitting: Physical Therapy

## 2019-11-02 DIAGNOSIS — R600 Localized edema: Secondary | ICD-10-CM

## 2019-11-02 DIAGNOSIS — M25561 Pain in right knee: Secondary | ICD-10-CM

## 2019-11-02 DIAGNOSIS — M6281 Muscle weakness (generalized): Secondary | ICD-10-CM

## 2019-11-02 DIAGNOSIS — M25661 Stiffness of right knee, not elsewhere classified: Secondary | ICD-10-CM

## 2019-11-02 DIAGNOSIS — I4811 Longstanding persistent atrial fibrillation: Secondary | ICD-10-CM

## 2019-11-02 NOTE — Therapy (Signed)
[facility] [place], [place], [address] Phone: [phone]   Fax:  [phone]  Physical Therapy Treatment  Patient Details  Name: Rhonda [name] MRN: [id number] Date of Birth: 07-Jun-1932 Referring Provider (PT): Gaynelle Arabian MD   Encounter Date: 11/02/2019   PT End of Session - 11/02/19 1314    Visit Number 7    Number of Visits 12    Date for PT Re-Evaluation 11/13/19    Authorization Type FOTO.    PT Start Time 1300    PT Stop Time 1350    PT Time Calculation (min) 50 min    Activity Tolerance Patient tolerated treatment well    Behavior During Therapy WFL for tasks assessed/performed           Past Medical History:  Diagnosis Date  . Arthritis   . Atrial fibrillation ([place]) 05/2015  . Dysrhythmia    afib  . Hypertension   . Osteopenia   . Syncope 04/2014    Past Surgical History:  Procedure Laterality Date  . BREAST EXCISIONAL BIOPSY Left   . BREAST EXCISIONAL BIOPSY Right   . COLONOSCOPY  2011  . KNEE ARTHROCENTESIS Bilateral   . KNEE ARTHROSCOPY     bil  . POLYPECTOMY  11-01-2009  . ROTATOR CUFF REPAIR    . SHOULDER SURGERY Left   . TOTAL KNEE ARTHROPLASTY Left   . TOTAL KNEE ARTHROPLASTY Right 10/12/2019   Procedure: TOTAL KNEE ARTHROPLASTY;  Surgeon: Gaynelle Arabian, MD;  Location: [facility];  Service: Orthopedics;  Laterality: Right;  35min  . VAGINAL HYSTERECTOMY     twice - vaginal and then oopherectomy    There were no vitals filed for this visit.   Subjective Assessment - 11/02/19 1313    Subjective COVID-19 screen performed prior to patient entering clinic. Patient arrives with more pain, stiffness, and swelling today.    Pertinent History HTN, osteopenia, left RTC repair.    How long can you walk comfortably? Around house with a FWW.    Patient Stated Goals Get around without right knee pain.    Currently in Pain? Yes    Pain Score 9     Pain Location Knee    Pain Orientation Right     Pain Descriptors / Indicators Sore;Tightness    Pain Type Surgical pain    Pain Onset 1 to 4 weeks ago    Pain Frequency Constant              [facility] PT Assessment - 11/02/19 0001      Assessment   Medical Diagnosis Right total knee replacement.    Referring Provider (PT) Gaynelle Arabian MD    Onset Date/Surgical Date 10/12/19    Next MD Visit 11/16/2019      Precautions   Precaution Comments No ultrasound.      Restrictions   Weight Bearing Restrictions No                         [facility] Adult PT Treatment/Exercise - 11/02/19 0001      Exercises   Exercises Knee/Hip      Knee/Hip Exercises: Aerobic   Nustep L3, seat 11-10 x15 min      Knee/Hip Exercises: Supine   Short Arc Quad Sets AROM;Right;2 sets;10 reps    Heel Slides AROM;Right;2 sets;10 reps      Modalities   Modalities Vasopneumatic;Electrical Hospital doctor  Stimulation Location R knee    Electrical Stimulation Action IFC    Electrical Stimulation Parameters 80-150 hz x15 mins    Electrical Stimulation Goals Pain;Edema      Vasopneumatic   Number Minutes Vasopneumatic  15 minutes    Vasopnuematic Location  Knee    Vasopneumatic Pressure Low    Vasopneumatic Temperature  34 for pain/edema      Manual Therapy   Manual Therapy Passive ROM;Edema management    Edema Management gentle distal to proximal edema massage to decrease edema    Passive ROM PROM in sitting into knee flexion and extension with gentle holds at end range; oscillations to prevent muscle guarding                       PT Long Term Goals - 10/27/19 1337      PT LONG TERM GOAL #1   Title Independent with a HEP.    Time 4    Period Weeks    Status Achieved      PT LONG TERM GOAL #2   Title Full right active knee extension in order to normalize gait.    Time 4    Period Weeks    Status On-going      PT LONG TERM GOAL #3   Title Active knee flexion to 115  degrees+ so the patient can perform functional tasks and do so with pain not > 2-3/10.    Time 4    Period Weeks    Status On-going      PT LONG TERM GOAL #4   Title Increase right knee strength to a solid 4+/5 to provide good stability for accomplishment of functional activities.    Time 4    Period Weeks    Status On-going      PT LONG TERM GOAL #5   Title Perform a reciprocating stair gait with one railing with pain not > 2-3/10.    Time 4    Period Weeks    Status On-going                 Plan - 11/02/19 1344    Clinical Impression Statement Patient responded fairly to therapy session but with increased stiffness in right knee. Increased edema noted bilaterally and instructed for patient to continue wearing compression stockings. PROM performed in sitting with short holds to improve ROM with gentle distal to proximal edema massage. No adverse affects upon removal of modalities.    Personal Factors and Comorbidities Comorbidity 1;Comorbidity 2    Comorbidities HTN, osteopenia, left RTC repair.    Examination-Activity Limitations Locomotion Level;Other;Transfers    Examination-Participation Restrictions Other    Stability/Clinical Decision Making Stable/Uncomplicated    Clinical Decision Making Low    Rehab Potential Excellent    PT Frequency 3x / week    PT Duration 4 weeks    PT Treatment/Interventions ADLs/Self Care Home Management;Cryotherapy;Electrical Stimulation;Moist Heat;Gait training;Stair training;Functional mobility training;Therapeutic activities;Therapeutic exercise;Neuromuscular re-education;Manual techniques;Patient/family education;Passive range of motion;Vasopneumatic Device    PT Next Visit Plan TKA protocol progression, Nustep, PROM, Vasopneumatic.    Consulted and Agree with Plan of Care Patient           Patient will benefit from skilled therapeutic intervention in order to improve the following deficits and impairments:  Pain, Abnormal gait,  Decreased activity tolerance, Decreased strength, Increased edema, Decreased range of motion  Visit Diagnosis: Chronic pain of right knee  Muscle weakness (generalized)  Stiffness of right  knee, not elsewhere classified  Localized edema     Problem List Patient Active Problem List   Diagnosis Date Noted  . OA (osteoarthritis) of knee 10/12/2019  . Primary osteoarthritis of right knee 10/12/2019  . Essential hypertension 08/06/2018  . Insomnia 06/19/2018  . Overweight (BMI 25.0-29.9) 09/19/2017  . Low serum vitamin D 05/24/2016  . Osteopenia 05/23/2016  . Atrial fibrillation ([place]) 07/13/2015  . Syncope 05/05/2014    Gabriela Eves, PT, DPT 11/02/2019, 4:27 PM  [facility] [address] [place], [place], [address] Phone: [phone]   Fax:  [phone]  Name: Joury Allcorn MRN: [id number] Date of Birth: [birth year]

## 2019-11-04 ENCOUNTER — Ambulatory Visit: Payer: PPO | Admitting: Physical Therapy

## 2019-11-04 ENCOUNTER — Other Ambulatory Visit: Payer: Self-pay

## 2019-11-04 ENCOUNTER — Encounter: Payer: Self-pay | Admitting: Physical Therapy

## 2019-11-04 DIAGNOSIS — R600 Localized edema: Secondary | ICD-10-CM

## 2019-11-04 DIAGNOSIS — M25561 Pain in right knee: Secondary | ICD-10-CM | POA: Diagnosis not present

## 2019-11-04 DIAGNOSIS — M25661 Stiffness of right knee, not elsewhere classified: Secondary | ICD-10-CM

## 2019-11-04 DIAGNOSIS — M6281 Muscle weakness (generalized): Secondary | ICD-10-CM

## 2019-11-04 NOTE — Therapy (Signed)
[facility] [place], [place], [address] Phone: [phone]   Fax:  [phone]  Physical Therapy Treatment  Patient Details  Name: Rhonda [name] MRN: [id number] Date of Birth: Apr 28, 1932 Referring Provider (PT): Gaynelle Arabian MD   Encounter Date: 11/04/2019   PT End of Session - 11/04/19 1309    Visit Number 8    Number of Visits 12    Date for PT Re-Evaluation 11/13/19    Authorization Type FOTO.    PT Start Time 1304    PT Stop Time 1346    PT Time Calculation (min) 42 min    Equipment Utilized During Treatment Other (comment)   FWW   Activity Tolerance Patient tolerated treatment well    Behavior During Therapy WFL for tasks assessed/performed           Past Medical History:  Diagnosis Date  . Arthritis   . Atrial fibrillation ([place]) 05/2015  . Dysrhythmia    afib  . Hypertension   . Osteopenia   . Syncope 04/2014    Past Surgical History:  Procedure Laterality Date  . BREAST EXCISIONAL BIOPSY Left   . BREAST EXCISIONAL BIOPSY Right   . COLONOSCOPY  2011  . KNEE ARTHROCENTESIS Bilateral   . KNEE ARTHROSCOPY     bil  . POLYPECTOMY  11-01-2009  . ROTATOR CUFF REPAIR    . SHOULDER SURGERY Left   . TOTAL KNEE ARTHROPLASTY Left   . TOTAL KNEE ARTHROPLASTY Right 10/12/2019   Procedure: TOTAL KNEE ARTHROPLASTY;  Surgeon: Gaynelle Arabian, MD;  Location: [facility];  Service: Orthopedics;  Laterality: Right;  61min  . VAGINAL HYSTERECTOMY     twice - vaginal and then oopherectomy    There were no vitals filed for this visit.   Subjective Assessment - 11/04/19 1309    Subjective COVID-19 screen performed prior to patient entering clinic. Reports she has less pain than last treatment.    Pertinent History HTN, osteopenia, left RTC repair.    How long can you walk comfortably? Around house with a FWW.    Patient Stated Goals Get around without right knee pain.    Currently in Pain? Yes    Pain Score 5      Pain Location Knee    Pain Orientation Right    Pain Descriptors / Indicators Sore    Pain Type Surgical pain    Pain Onset 1 to 4 weeks ago    Pain Frequency Constant              [facility] PT Assessment - 11/04/19 0001      Assessment   Medical Diagnosis Right total knee replacement.    Referring Provider (PT) Gaynelle Arabian MD    Onset Date/Surgical Date 10/12/19    Next MD Visit 11/16/2019      Precautions   Precaution Comments No ultrasound.      Restrictions   Weight Bearing Restrictions No                         [facility] Adult PT Treatment/Exercise - 11/04/19 0001      Knee/Hip Exercises: Aerobic   Nustep L3, seat 9 x25min      Knee/Hip Exercises: Standing   Hip Flexion AROM;Knee bent;Right;10 reps    Hip Abduction AROM;Right;15 reps;Knee straight      Knee/Hip Exercises: Seated   Long Arc Quad AROM;Right;2 sets;10 reps      Knee/Hip Exercises: Supine  Short Arc [employer] AROM;Right;2 sets;10 reps    Heel Slides AROM;Right;10 reps      Modalities   Modalities Vasopneumatic      Vasopneumatic   Number Minutes Vasopneumatic  10 minutes    Vasopnuematic Location  Knee    Vasopneumatic Pressure Low    Vasopneumatic Temperature  34 for pain/edema                       PT Long Term Goals - 10/27/19 1337      PT LONG TERM GOAL #1   Title Independent with a HEP.    Time 4    Period Weeks    Status Achieved      PT LONG TERM GOAL #2   Title Full right active knee extension in order to normalize gait.    Time 4    Period Weeks    Status On-going      PT LONG TERM GOAL #3   Title Active knee flexion to 115 degrees+ so the patient can perform functional tasks and do so with pain not > 2-3/10.    Time 4    Period Weeks    Status On-going      PT LONG TERM GOAL #4   Title Increase right knee strength to a solid 4+/5 to provide good stability for accomplishment of functional activities.    Time 4    Period Weeks    Status  On-going      PT LONG TERM GOAL #5   Title Perform a reciprocating stair gait with one railing with pain not > 2-3/10.    Time 4    Period Weeks    Status On-going                 Plan - 11/04/19 1336    Clinical Impression Statement Patient presented in clinic with overall R knee pain from last treatment. Patient also presents with increased edema of BLE with TED hose donned. Patient continues to fatigue very quickly with therex in any position. Light quad strengthening completed with fatigue reported by patient and soreness. Normal vasopneumatic response noted following removal of the modality.    Personal Factors and Comorbidities Comorbidity 1;Comorbidity 2    Comorbidities HTN, osteopenia, left RTC repair.    Examination-Activity Limitations Locomotion Level;Other;Transfers    Examination-Participation Restrictions Other    Stability/Clinical Decision Making Stable/Uncomplicated    Rehab Potential Excellent    PT Frequency 3x / week    PT Duration 4 weeks    PT Treatment/Interventions ADLs/Self Care Home Management;Cryotherapy;Electrical Stimulation;Moist Heat;Gait training;Stair training;Functional mobility training;Therapeutic activities;Therapeutic exercise;Neuromuscular re-education;Manual techniques;Patient/family education;Passive range of motion;Vasopneumatic Device    PT Next Visit Plan TKA protocol progression, Nustep, PROM, Vasopneumatic.    Consulted and Agree with Plan of Care Patient           Patient will benefit from skilled therapeutic intervention in order to improve the following deficits and impairments:  Pain, Abnormal gait, Decreased activity tolerance, Decreased strength, Increased edema, Decreased range of motion  Visit Diagnosis: Chronic pain of right knee  Muscle weakness (generalized)  Stiffness of right knee, not elsewhere classified  Localized edema     Problem List Patient Active Problem List   Diagnosis Date Noted  . OA  (osteoarthritis) of knee 10/12/2019  . Primary osteoarthritis of right knee 10/12/2019  . Essential hypertension 08/06/2018  . Insomnia 06/19/2018  . Overweight (BMI 25.0-29.9) 09/19/2017  . Low serum vitamin D  05/24/2016  . Osteopenia 05/23/2016  . Atrial fibrillation ([place]) 07/13/2015  . Syncope 05/05/2014    Standley Brooking, PTA 11/04/2019, 2:29 PM  [facility] [address] [place], [place], [address] Phone: [phone]   Fax:  [phone]  Name: Rhonda [name] MRN: [id number] Date of Birth: 1932-05-27

## 2019-11-06 ENCOUNTER — Other Ambulatory Visit: Payer: Self-pay | Admitting: Family Medicine

## 2019-11-06 DIAGNOSIS — Z1231 Encounter for screening mammogram for malignant neoplasm of breast: Secondary | ICD-10-CM

## 2019-11-09 ENCOUNTER — Ambulatory Visit: Payer: PPO | Admitting: Physical Therapy

## 2019-11-09 ENCOUNTER — Encounter: Payer: Self-pay | Admitting: Physical Therapy

## 2019-11-09 ENCOUNTER — Other Ambulatory Visit: Payer: Self-pay

## 2019-11-09 DIAGNOSIS — M25561 Pain in right knee: Secondary | ICD-10-CM | POA: Diagnosis not present

## 2019-11-09 DIAGNOSIS — M25661 Stiffness of right knee, not elsewhere classified: Secondary | ICD-10-CM

## 2019-11-09 DIAGNOSIS — M6281 Muscle weakness (generalized): Secondary | ICD-10-CM

## 2019-11-09 DIAGNOSIS — G8929 Other chronic pain: Secondary | ICD-10-CM

## 2019-11-09 DIAGNOSIS — R600 Localized edema: Secondary | ICD-10-CM

## 2019-11-09 NOTE — Therapy (Signed)
[facility] [place], [place], [address] Phone: [phone]   Fax:  [phone]  Physical Therapy Treatment  Patient Details  Name: Rhonda [name] MRN: [id number] Date of Birth: 1932-07-18 Referring Provider (PT): Gaynelle Arabian MD   Encounter Date: 11/09/2019   PT End of Session - 11/09/19 1302    Visit Number 9    Number of Visits 12    Date for PT Re-Evaluation 11/13/19    Authorization Type FOTO.    PT Start Time 1300    PT Stop Time 1347    PT Time Calculation (min) 47 min    Equipment Utilized During Treatment Other (comment)   FWW   Activity Tolerance Patient tolerated treatment well    Behavior During Therapy WFL for tasks assessed/performed           Past Medical History:  Diagnosis Date  . Arthritis   . Atrial fibrillation ([place]) 05/2015  . Dysrhythmia    afib  . Hypertension   . Osteopenia   . Syncope 04/2014    Past Surgical History:  Procedure Laterality Date  . BREAST EXCISIONAL BIOPSY Left   . BREAST EXCISIONAL BIOPSY Right   . COLONOSCOPY  2011  . KNEE ARTHROCENTESIS Bilateral   . KNEE ARTHROSCOPY     bil  . POLYPECTOMY  11-01-2009  . ROTATOR CUFF REPAIR    . SHOULDER SURGERY Left   . TOTAL KNEE ARTHROPLASTY Left   . TOTAL KNEE ARTHROPLASTY Right 10/12/2019   Procedure: TOTAL KNEE ARTHROPLASTY;  Surgeon: Gaynelle Arabian, MD;  Location: [facility];  Service: Orthopedics;  Laterality: Right;  45min  . VAGINAL HYSTERECTOMY     twice - vaginal and then oopherectomy    There were no vitals filed for this visit.   Subjective Assessment - 11/09/19 1259    Subjective COVID-19 screen performed prior to patient entering clinic. Reports her knee is less swollen today.    Pertinent History HTN, osteopenia, left RTC repair.    How long can you walk comfortably? Around house with a FWW.    Patient Stated Goals Get around without right knee pain.    Currently in Pain? Yes    Pain Score 5     Pain  Location Knee    Pain Orientation Right    Pain Descriptors / Indicators Sore    Pain Type Surgical pain    Pain Onset 1 to 4 weeks ago    Pain Frequency Constant              [facility] PT Assessment - 11/09/19 0001      Assessment   Medical Diagnosis Right total knee replacement.    Referring Provider (PT) Gaynelle Arabian MD    Onset Date/Surgical Date 10/12/19    Next MD Visit 11/16/2019      Precautions   Precaution Comments No ultrasound.      Restrictions   Weight Bearing Restrictions No      Observation/Other Assessments-Edema    Edema Circumferential      Circumferential Edema   Circumferential - Right 47.1 cm    Circumferential - Left  44 cm      ROM / Strength   AROM / PROM / Strength AROM      AROM   Overall AROM  Deficits    AROM Assessment Site Knee    Right/Left Knee Right    Right Knee Extension 7    Right Knee Flexion 103  [place] Adult PT Treatment/Exercise - 11/09/19 0001      Ambulation/Gait   Ambulation/Gait Yes    Ambulation Distance (Feet) 117 Feet    Assistive device Small based quad cane    Gait Pattern Step-to pattern;Decreased step length - right;Decreased stride length;Decreased stance time - right;Right foot flat;Left foot flat;Antalgic;Decreased trunk rotation;Narrow base of support    Ambulation Surface Level;Indoor      Knee/Hip Exercises: Aerobic   Nustep L3, seat 9-7 x13 min      Knee/Hip Exercises: Standing   Forward Lunges Right;15 reps;3 seconds    Forward Step Up Right;5 reps;Hand Hold: 2;Step Height: 6"    Rocker Board 3 minutes      Knee/Hip Exercises: Seated   Long Arc Quad Strengthening;Right;15 reps;Weights    Long Arc Quad Weight 3 lbs.      Modalities   Modalities Vasopneumatic;Electrical Stimulation      Acupuncturist Location R knee    Electrical Stimulation Action IFC    Electrical Stimulation Parameters 80-150 hz x10 min    Electrical  Stimulation Goals Pain;Edema      Vasopneumatic   Number Minutes Vasopneumatic  10 minutes    Vasopnuematic Location  Knee    Vasopneumatic Pressure Low    Vasopneumatic Temperature  34 for pain/edema                       PT Long Term Goals - 10/27/19 1337      PT LONG TERM GOAL #1   Title Independent with a HEP.    Time 4    Period Weeks    Status Achieved      PT LONG TERM GOAL #2   Title Full right active knee extension in order to normalize gait.    Time 4    Period Weeks    Status On-going      PT LONG TERM GOAL #3   Title Active knee flexion to 115 degrees+ so the patient can perform functional tasks and do so with pain not > 2-3/10.    Time 4    Period Weeks    Status On-going      PT LONG TERM GOAL #4   Title Increase right knee strength to a solid 4+/5 to provide good stability for accomplishment of functional activities.    Time 4    Period Weeks    Status On-going      PT LONG TERM GOAL #5   Title Perform a reciprocating stair gait with one railing with pain not > 2-3/10.    Time 4    Period Weeks    Status On-going                 Plan - 11/09/19 1356    Clinical Impression Statement Patient presented in clinic with less R knee pain and edema. Patient requested to try gait training with cane in which she did fairly well after education of gait sequence and with VCs. Patient ambulates with foot flat which limits knee ROM during gait sequence. Patient continues to fatigue easily with standing activities and B TED hose donned as she continues to have varying amounts of LE edema. AROM of R knee measured as 7-103 deg today. Normal modalities response noted following removal of the modalities. Patient educated to elevate her knee and ankle above her heart to assist with edema control and to try with her cane at home in her hallway to  gain confidence with gait sequencing.    Personal Factors and Comorbidities Comorbidity 1;Comorbidity 2     Comorbidities HTN, osteopenia, left RTC repair.    Examination-Activity Limitations Locomotion Level;Other;Transfers    Examination-Participation Restrictions Other    Stability/Clinical Decision Making Stable/Uncomplicated    Rehab Potential Excellent    PT Frequency 3x / week    PT Duration 4 weeks    PT Treatment/Interventions ADLs/Self Care Home Management;Cryotherapy;Electrical Stimulation;Moist Heat;Gait training;Stair training;Functional mobility training;Therapeutic activities;Therapeutic exercise;Neuromuscular re-education;Manual techniques;Patient/family education;Passive range of motion;Vasopneumatic Device    PT Next Visit Plan TKA protocol progression, Nustep, PROM, Vasopneumatic.    Consulted and Agree with Plan of Care Patient           Patient will benefit from skilled therapeutic intervention in order to improve the following deficits and impairments:  Pain, Abnormal gait, Decreased activity tolerance, Decreased strength, Increased edema, Decreased range of motion  Visit Diagnosis: Chronic pain of right knee  Muscle weakness (generalized)  Stiffness of right knee, not elsewhere classified  Localized edema     Problem List Patient Active Problem List   Diagnosis Date Noted  . OA (osteoarthritis) of knee 10/12/2019  . Primary osteoarthritis of right knee 10/12/2019  . Essential hypertension 08/06/2018  . Insomnia 06/19/2018  . Overweight (BMI 25.0-29.9) 09/19/2017  . Low serum vitamin D 05/24/2016  . Osteopenia 05/23/2016  . Atrial fibrillation ([place]) 07/13/2015  . Syncope 05/05/2014    Standley Brooking, PTA 11/09/2019, 2:06 PM  [facility] [place], [place], [address] Phone: [phone]   Fax:  [phone]  Name: Recia Sons MRN: [id number] Date of Birth: [birth year]

## 2019-11-11 ENCOUNTER — Ambulatory Visit: Payer: PPO | Admitting: Physical Therapy

## 2019-11-11 ENCOUNTER — Encounter: Payer: Self-pay | Admitting: Physical Therapy

## 2019-11-11 ENCOUNTER — Other Ambulatory Visit: Payer: Self-pay

## 2019-11-11 DIAGNOSIS — M6281 Muscle weakness (generalized): Secondary | ICD-10-CM

## 2019-11-11 DIAGNOSIS — M25661 Stiffness of right knee, not elsewhere classified: Secondary | ICD-10-CM

## 2019-11-11 DIAGNOSIS — M25561 Pain in right knee: Secondary | ICD-10-CM

## 2019-11-11 DIAGNOSIS — R600 Localized edema: Secondary | ICD-10-CM

## 2019-11-11 NOTE — Therapy (Signed)
[facility] [place], [place], [address] Phone: [phone]   Fax:  [phone]  Physical Therapy Treatment  Patient Details  Name: Rhonda [name] MRN: [id number] Date of Birth: 1932-11-05 Referring Provider (PT): Gaynelle Arabian MD   Encounter Date: 11/11/2019   PT End of Session - 11/11/19 1306    Visit Number 10    Number of Visits 12    Date for PT Re-Evaluation 11/13/19    Authorization Type FOTO.    PT Start Time 1302    PT Stop Time 1351    PT Time Calculation (min) 49 min    Equipment Utilized During Treatment Other (comment)   FWW   Activity Tolerance Patient tolerated treatment well    Behavior During Therapy WFL for tasks assessed/performed           Past Medical History:  Diagnosis Date  . Arthritis   . Atrial fibrillation ([place]) 05/2015  . Dysrhythmia    afib  . Hypertension   . Osteopenia   . Syncope 04/2014    Past Surgical History:  Procedure Laterality Date  . BREAST EXCISIONAL BIOPSY Left   . BREAST EXCISIONAL BIOPSY Right   . COLONOSCOPY  2011  . KNEE ARTHROCENTESIS Bilateral   . KNEE ARTHROSCOPY     bil  . POLYPECTOMY  11-01-2009  . ROTATOR CUFF REPAIR    . SHOULDER SURGERY Left   . TOTAL KNEE ARTHROPLASTY Left   . TOTAL KNEE ARTHROPLASTY Right 10/12/2019   Procedure: TOTAL KNEE ARTHROPLASTY;  Surgeon: Gaynelle Arabian, MD;  Location: [facility];  Service: Orthopedics;  Laterality: Right;  22min  . VAGINAL HYSTERECTOMY     twice - vaginal and then oopherectomy    There were no vitals filed for this visit.   Subjective Assessment - 11/11/19 1305    Subjective COVID-19 screen performed prior to patient entering clinic. Patient fell backwards Monday night when trying to get into bed. States that her buttocks is sore but her knee is fine.    Pertinent History HTN, osteopenia, left RTC repair.    How long can you walk comfortably? Around house with a FWW.    Patient Stated Goals Get around  without right knee pain.    Currently in Pain? No/denies    Pain Score 5     Pain Location Knee    Pain Orientation Right    Pain Descriptors / Indicators Discomfort;Other (Comment)   stiffness   Pain Type Surgical pain    Pain Onset 1 to 4 weeks ago    Pain Frequency Constant              [facility] PT Assessment - 11/11/19 0001      Assessment   Medical Diagnosis Right total knee replacement.    Referring Provider (PT) Gaynelle Arabian MD    Onset Date/Surgical Date 10/12/19    Next MD Visit 11/16/2019      Precautions   Precaution Comments No ultrasound.      Restrictions   Weight Bearing Restrictions No      Observation/Other Assessments   Focus on Therapeutic Outcomes (FOTO)  35% limtation, CJ status      ROM / Strength   AROM / PROM / Strength AROM      AROM   Overall AROM  Deficits    AROM Assessment Site Knee    Right/Left Knee Right    Right Knee Extension 7    Right Knee Flexion 107  [place] Adult PT Treatment/Exercise - 11/11/19 0001      Knee/Hip Exercises: Aerobic   Nustep L3, seat 9 x11 min      Knee/Hip Exercises: Standing   Hip Abduction AROM;Right;15 reps;Knee straight    Forward Step Up Right;15 reps;Hand Hold: 2;Step Height: 6"    Rocker Board 2 minutes      Knee/Hip Exercises: Seated   Sit to Sand 10 reps;with UE support      Knee/Hip Exercises: Supine   Short Arc Quad Sets Strengthening;Right;2 sets;10 reps    Short Arc Quad Sets Limitations 3#; VMS NMR to R quad x8 min      Modalities   Modalities Pensions consultant VMS with SAQ    Electrical Stimulation Parameters 10/10, 50 bps, 5 sec ramp, 300 usec x8 min    Electrical Stimulation Goals Neuromuscular facilitation      Vasopneumatic   Number Minutes Vasopneumatic  10 minutes    Vasopnuematic Location  Knee    Vasopneumatic  Pressure Low    Vasopneumatic Temperature  34 for pain/edema                       PT Long Term Goals - 10/27/19 1337      PT LONG TERM GOAL #1   Title Independent with a HEP.    Time 4    Period Weeks    Status Achieved      PT LONG TERM GOAL #2   Title Full right active knee extension in order to normalize gait.    Time 4    Period Weeks    Status On-going      PT LONG TERM GOAL #3   Title Active knee flexion to 115 degrees+ so the patient can perform functional tasks and do so with pain not > 2-3/10.    Time 4    Period Weeks    Status On-going      PT LONG TERM GOAL #4   Title Increase right knee strength to a solid 4+/5 to provide good stability for accomplishment of functional activities.    Time 4    Period Weeks    Status On-going      PT LONG TERM GOAL #5   Title Perform a reciprocating stair gait with one railing with pain not > 2-3/10.    Time 4    Period Weeks    Status On-going                 Plan - 11/11/19 1336    Clinical Impression Statement Patient presented in clinic with reports of buttock soreness after falling backwards 11/09/2019. Patient denies any trauma to R knee during fall. Patient had been gait trained utilizing [facility] but after fall patient is hesistant to use within her home for safety. Patient guided through LE strengthening with initial reps of step ups requiring more UE support due to RLE weakness. Deficient R quad activation noted with SAQ as no visible muscle contraction observed. Patient fatigued with VMS SAQ by end of 8 minute session. AROM of R knee measured as 7-107 deg today in supine. Normal modalities response noted following removal of the modalities.    Personal Factors and Comorbidities Comorbidity 1;Comorbidity 2    Comorbidities HTN, osteopenia, left RTC repair.    Examination-Activity Limitations Locomotion Level;Other;Transfers    Examination-Participation Restrictions Other  Stability/Clinical Decision  Making Stable/Uncomplicated    Rehab Potential Excellent    PT Frequency 3x / week    PT Duration 4 weeks    PT Treatment/Interventions ADLs/Self Care Home Management;Cryotherapy;Electrical Stimulation;Moist Heat;Gait training;Stair training;Functional mobility training;Therapeutic activities;Therapeutic exercise;Neuromuscular re-education;Manual techniques;Patient/family education;Passive range of motion;Vasopneumatic Device    PT Next Visit Plan TKA protocol progression, Nustep, PROM, Vasopneumatic.    Consulted and Agree with Plan of Care Patient           Patient will benefit from skilled therapeutic intervention in order to improve the following deficits and impairments:  Pain, Abnormal gait, Decreased activity tolerance, Decreased strength, Increased edema, Decreased range of motion  Visit Diagnosis: Chronic pain of right knee  Muscle weakness (generalized)  Stiffness of right knee, not elsewhere classified  Localized edema     Problem List Patient Active Problem List   Diagnosis Date Noted  . OA (osteoarthritis) of knee 10/12/2019  . Primary osteoarthritis of right knee 10/12/2019  . Essential hypertension 08/06/2018  . Insomnia 06/19/2018  . Overweight (BMI 25.0-29.9) 09/19/2017  . Low serum vitamin D 05/24/2016  . Osteopenia 05/23/2016  . Atrial fibrillation ([place]) 07/13/2015  . Syncope 05/05/2014    Standley Brooking, PTA 11/11/19 4:01 PM   [facility] [place], [place], [address] Phone: [phone]   Fax:  [phone]  Name: Rheya Minogue MRN: [id number] Date of Birth: 03/24/1933

## 2019-11-16 ENCOUNTER — Ambulatory Visit: Payer: PPO | Admitting: Physical Therapy

## 2019-11-16 ENCOUNTER — Encounter: Payer: Self-pay | Admitting: Physical Therapy

## 2019-11-16 ENCOUNTER — Other Ambulatory Visit: Payer: Self-pay

## 2019-11-16 DIAGNOSIS — M6281 Muscle weakness (generalized): Secondary | ICD-10-CM

## 2019-11-16 DIAGNOSIS — M25561 Pain in right knee: Secondary | ICD-10-CM | POA: Diagnosis not present

## 2019-11-16 DIAGNOSIS — M25661 Stiffness of right knee, not elsewhere classified: Secondary | ICD-10-CM

## 2019-11-16 DIAGNOSIS — G8929 Other chronic pain: Secondary | ICD-10-CM

## 2019-11-16 DIAGNOSIS — R600 Localized edema: Secondary | ICD-10-CM

## 2019-11-16 NOTE — Therapy (Signed)
[facility] [place], [place], [address] Phone: [phone]   Fax:  [phone]  Physical Therapy Treatment  Patient Details  Name: Rhonda [name] MRN: [id number] Date of Birth: [birth year] Referring Provider (PT): Gaynelle Arabian MD   Encounter Date: 11/16/2019   PT End of Session - 11/16/19 1324    Visit Number 11    Number of Visits 18    Date for PT Re-Evaluation 12/11/19    Authorization Type FOTO.    PT Start Time 1302    PT Stop Time 1351    PT Time Calculation (min) 49 min    Equipment Utilized During Treatment Other (comment)   FWW   Activity Tolerance Patient tolerated treatment well    Behavior During Therapy WFL for tasks assessed/performed           Past Medical History:  Diagnosis Date   Arthritis    Atrial fibrillation ([place]) 05/2015   Dysrhythmia    afib   Hypertension    Osteopenia    Syncope 04/2014    Past Surgical History:  Procedure Laterality Date   BREAST EXCISIONAL BIOPSY Left    BREAST EXCISIONAL BIOPSY Right    COLONOSCOPY  2011   KNEE ARTHROCENTESIS Bilateral    KNEE ARTHROSCOPY     bil   POLYPECTOMY  11-01-2009   ROTATOR CUFF REPAIR     SHOULDER SURGERY Left    TOTAL KNEE ARTHROPLASTY Left    TOTAL KNEE ARTHROPLASTY Right 10/12/2019   Procedure: TOTAL KNEE ARTHROPLASTY;  Surgeon: Gaynelle Arabian, MD;  Location: [facility];  Service: Orthopedics;  Laterality: Right;  19min   VAGINAL HYSTERECTOMY     twice - vaginal and then oopherectomy    There were no vitals filed for this visit.   Subjective Assessment - 11/16/19 1316    Subjective COVID-19 screen performed prior to patient entering clinic. Patient reports not being able to sleep well.    Pertinent History HTN, osteopenia, left RTC repair.    How long can you walk comfortably? Around house with a FWW.    Patient Stated Goals Get around without right knee pain.    Currently in Pain? Yes    Pain Score 6      Pain Location Hip    Pain Orientation Right;Lateral    Pain Descriptors / Indicators Discomfort    Pain Type Surgical pain    Pain Onset More than a month ago    Pain Frequency Constant              [facility] PT Assessment - 11/16/19 0001      Assessment   Medical Diagnosis Right total knee replacement.    Referring Provider (PT) Gaynelle Arabian MD    Onset Date/Surgical Date 10/12/19    Next MD Visit 11/17/2019      Precautions   Precaution Comments No ultrasound.      Restrictions   Weight Bearing Restrictions No      Observation/Other Assessments-Edema    Edema Circumferential      Circumferential Edema   Circumferential - Right 46 cm    Circumferential - Left  43 cm      ROM / Strength   AROM / PROM / Strength AROM      AROM   Overall AROM  Deficits    AROM Assessment Site Knee    Right/Left Knee Right    Right Knee Extension 7    Right Knee Flexion 107  [place] Adult PT Treatment/Exercise - 11/16/19 0001      Knee/Hip Exercises: Aerobic   Nustep L5, seat 9-7 x14 min      Knee/Hip Exercises: Standing   Forward Lunges Right;20 reps;3 seconds    Rocker Board 2 minutes      Knee/Hip Exercises: Supine   Quad Sets Strengthening;Right;20 reps      Modalities   Modalities Psychologist, educational Location R knee    Electrical Stimulation Action IFC    Electrical Stimulation Parameters 80-150 hz x15 min    Electrical Stimulation Goals Pain;Edema      Vasopneumatic   Number Minutes Vasopneumatic  15 minutes    Vasopnuematic Location  Knee    Vasopneumatic Pressure Medium    Vasopneumatic Temperature  34 for pain/edema                       PT Long Term Goals - 10/27/19 1337      PT LONG TERM GOAL #1   Title Independent with a HEP.    Time 4    Period Weeks    Status Achieved      PT LONG TERM GOAL #2   Title Full right active knee  extension in order to normalize gait.    Time 4    Period Weeks    Status On-going      PT LONG TERM GOAL #3   Title Active knee flexion to 115 degrees+ so the patient can perform functional tasks and do so with pain not > 2-3/10.    Time 4    Period Weeks    Status On-going      PT LONG TERM GOAL #4   Title Increase right knee strength to a solid 4+/5 to provide good stability for accomplishment of functional activities.    Time 4    Period Weeks    Status On-going      PT LONG TERM GOAL #5   Title Perform a reciprocating stair gait with one railing with pain not > 2-3/10.    Time 4    Period Weeks    Status On-going                 Plan - 11/16/19 1339    Clinical Impression Statement Patient presented in clinic with reports of continued difficulty in sleeping but some reduction of edema. Patient has been gait trained for [facility] but after a fall backwards last weekend she has returned to [facility] with no trauma to R knee. Patient's knee edema is within 3 cm upon comparison. Patient very guarded and hesistant with therex due to pain and discomfort. AROM of R knee meausred as 7-107 deg. VMS for neuro re-ed of R quad attempted but patient unable to tolerate but for short duration due to fatigue. Normal modalities response noted following removal of the modalities.    Personal Factors and Comorbidities Comorbidity 1;Comorbidity 2    Comorbidities HTN, osteopenia, left RTC repair.    Examination-Activity Limitations Locomotion Level;Other;Transfers    Examination-Participation Restrictions Other    Stability/Clinical Decision Making Stable/Uncomplicated    Rehab Potential Excellent    PT Frequency 3x / week    PT Duration 4 weeks    PT Treatment/Interventions ADLs/Self Care Home Management;Cryotherapy;Electrical Stimulation;Moist Heat;Gait training;Stair training;Functional mobility training;Therapeutic activities;Therapeutic exercise;Neuromuscular re-education;Manual  techniques;Patient/family education;Passive range of motion;Vasopneumatic Device    PT Next Visit Plan TKA protocol progression, Nustep,  PROM, Vasopneumatic.    Consulted and Agree with Plan of Care Patient           Patient will benefit from skilled therapeutic intervention in order to improve the following deficits and impairments:  Pain, Abnormal gait, Decreased activity tolerance, Decreased strength, Increased edema, Decreased range of motion  Visit Diagnosis: Chronic pain of right knee  Muscle weakness (generalized)  Stiffness of right knee, not elsewhere classified  Localized edema     Problem List Patient Active Problem List   Diagnosis Date Noted   OA (osteoarthritis) of knee 10/12/2019   Primary osteoarthritis of right knee 10/12/2019   Essential hypertension 08/06/2018   Insomnia 06/19/2018   Overweight (BMI 25.0-29.9) 09/19/2017   Low serum vitamin D 05/24/2016   Osteopenia 05/23/2016   Atrial fibrillation ([place]) 07/13/2015   Syncope 05/05/2014    Standley Brooking, PTA 11/16/19 2:25 PM   [facility] [address] [place], [place], [address] Phone: [phone]   Fax:  [phone]  Name: Rhonda [name] MRN: [id number] Date of Birth: Mar 04, 1933

## 2019-11-17 DIAGNOSIS — Z96651 Presence of right artificial knee joint: Secondary | ICD-10-CM | POA: Diagnosis not present

## 2019-11-18 ENCOUNTER — Encounter: Payer: Self-pay | Admitting: Physical Therapy

## 2019-11-18 ENCOUNTER — Ambulatory Visit: Payer: PPO | Admitting: Physical Therapy

## 2019-11-18 ENCOUNTER — Other Ambulatory Visit: Payer: Self-pay

## 2019-11-18 DIAGNOSIS — G8929 Other chronic pain: Secondary | ICD-10-CM

## 2019-11-18 DIAGNOSIS — M25561 Pain in right knee: Secondary | ICD-10-CM | POA: Diagnosis not present

## 2019-11-18 DIAGNOSIS — M25661 Stiffness of right knee, not elsewhere classified: Secondary | ICD-10-CM

## 2019-11-18 DIAGNOSIS — R600 Localized edema: Secondary | ICD-10-CM

## 2019-11-18 DIAGNOSIS — M6281 Muscle weakness (generalized): Secondary | ICD-10-CM

## 2019-11-18 NOTE — Therapy (Signed)
[facility] [place], [place], [address] Phone: [phone]   Fax:  [phone]  Physical Therapy Treatment  Patient Details  Name: Rhonda [name] MRN: [id number] Date of Birth: 12/17/1932 Referring Provider (PT): Gaynelle Arabian MD   Encounter Date: 11/18/2019   PT End of Session - 11/18/19 1341    Visit Number 12    Number of Visits 18    Date for PT Re-Evaluation 12/11/19    Authorization Type FOTO.    PT Start Time 1302    PT Stop Time 1348    PT Time Calculation (min) 46 min    Equipment Utilized During Treatment Other (comment)   FWW   Activity Tolerance Patient tolerated treatment well    Behavior During Therapy WFL for tasks assessed/performed           Past Medical History:  Diagnosis Date  . Arthritis   . Atrial fibrillation ([place]) 05/2015  . Dysrhythmia    afib  . Hypertension   . Osteopenia   . Syncope 04/2014    Past Surgical History:  Procedure Laterality Date  . BREAST EXCISIONAL BIOPSY Left   . BREAST EXCISIONAL BIOPSY Right   . COLONOSCOPY  2011  . KNEE ARTHROCENTESIS Bilateral   . KNEE ARTHROSCOPY     bil  . POLYPECTOMY  11-01-2009  . ROTATOR CUFF REPAIR    . SHOULDER SURGERY Left   . TOTAL KNEE ARTHROPLASTY Left   . TOTAL KNEE ARTHROPLASTY Right 10/12/2019   Procedure: TOTAL KNEE ARTHROPLASTY;  Surgeon: Gaynelle Arabian, MD;  Location: [facility];  Service: Orthopedics;  Laterality: Right;  14min  . VAGINAL HYSTERECTOMY     twice - vaginal and then oopherectomy    There were no vitals filed for this visit.   Subjective Assessment - 11/18/19 1340    Subjective COVID-19 screen performed prior to patient entering clinic. Patient reports that MD pleased but provided new medication due to tenderness and for sleeping. Patient reports some improvement since being able to sleep better.    Pertinent History HTN, osteopenia, left RTC repair.    How long can you walk comfortably? Around house with  a FWW.    Patient Stated Goals Get around without right knee pain.    Currently in Pain? Yes    Pain Score 5     Pain Location Knee    Pain Orientation Right    Pain Descriptors / Indicators Discomfort;Tender    Pain Type Surgical pain    Pain Onset More than a month ago    Pain Frequency Constant              [facility] PT Assessment - 11/18/19 0001      Assessment   Medical Diagnosis Right total knee replacement.    Referring Provider (PT) Gaynelle Arabian MD    Onset Date/Surgical Date 10/12/19    Next MD Visit 12/29/2019      Precautions   Precaution Comments No ultrasound.      Restrictions   Weight Bearing Restrictions No                         [facility] Adult PT Treatment/Exercise - 11/18/19 0001      Knee/Hip Exercises: Aerobic   Nustep L4, seat 10 x15 min      Knee/Hip Exercises: Standing   Forward Lunges Right;15 reps;2 seconds    Hip Abduction AROM;Both;15 reps;Knee straight    Forward Step Up  Right;15 reps;Hand Hold: 2;Step Height: 6"      Knee/Hip Exercises: Seated   Sit to Sand 10 reps;with UE support      Knee/Hip Exercises: Supine   Quad Sets Strengthening;Right;20 reps      Modalities   Modalities Psychologist, educational Location R knee    Electrical Stimulation Action IFC    Electrical Stimulation Parameters 80-150 hz x15 min    Electrical Stimulation Goals Pain;Edema      Vasopneumatic   Number Minutes Vasopneumatic  15 minutes    Vasopnuematic Location  Knee    Vasopneumatic Pressure Medium    Vasopneumatic Temperature  34 for pain/edema                       PT Long Term Goals - 10/27/19 1337      PT LONG TERM GOAL #1   Title Independent with a HEP.    Time 4    Period Weeks    Status Achieved      PT LONG TERM GOAL #2   Title Full right active knee extension in order to normalize gait.    Time 4    Period Weeks    Status On-going      PT  LONG TERM GOAL #3   Title Active knee flexion to 115 degrees+ so the patient can perform functional tasks and do so with pain not > 2-3/10.    Time 4    Period Weeks    Status On-going      PT LONG TERM GOAL #4   Title Increase right knee strength to a solid 4+/5 to provide good stability for accomplishment of functional activities.    Time 4    Period Weeks    Status On-going      PT LONG TERM GOAL #5   Title Perform a reciprocating stair gait with one railing with pain not > 2-3/10.    Time 4    Period Weeks    Status On-going                 Plan - 11/18/19 1353    Clinical Impression Statement Patient presented in clinic with reports of improvement following MD visit as she was prescribed medication for sleep and knee tenderness. Patient continues to demonstrate weakness with R forward step ups and uses UE support more. Lack of visible quad contraction noted but may also be limited by edema present surrouding R knee. Patient instructed to continue QS at home to further fasciltate R quad contraction. Normal modalities response noted following removal of the modalities.    Personal Factors and Comorbidities Comorbidity 1;Comorbidity 2    Comorbidities HTN, osteopenia, left RTC repair.    Examination-Activity Limitations Locomotion Level;Other;Transfers    Examination-Participation Restrictions Other    Stability/Clinical Decision Making Stable/Uncomplicated    Rehab Potential Excellent    PT Frequency 3x / week    PT Duration 4 weeks    PT Treatment/Interventions ADLs/Self Care Home Management;Cryotherapy;Electrical Stimulation;Moist Heat;Gait training;Stair training;Functional mobility training;Therapeutic activities;Therapeutic exercise;Neuromuscular re-education;Manual techniques;Patient/family education;Passive range of motion;Vasopneumatic Device    PT Next Visit Plan TKA protocol progression, Nustep, PROM, Vasopneumatic.    Consulted and Agree with Plan of Care Patient            Patient will benefit from skilled therapeutic intervention in order to improve the following deficits and impairments:  Pain, Abnormal gait, Decreased activity tolerance,  Decreased strength, Increased edema, Decreased range of motion  Visit Diagnosis: Chronic pain of right knee  Muscle weakness (generalized)  Stiffness of right knee, not elsewhere classified  Localized edema     Problem List Patient Active Problem List   Diagnosis Date Noted  . OA (osteoarthritis) of knee 10/12/2019  . Primary osteoarthritis of right knee 10/12/2019  . Essential hypertension 08/06/2018  . Insomnia 06/19/2018  . Overweight (BMI 25.0-29.9) 09/19/2017  . Low serum vitamin D 05/24/2016  . Osteopenia 05/23/2016  . Atrial fibrillation ([place]) 07/13/2015  . Syncope 05/05/2014    Standley Brooking, PTA 11/18/2019, 1:59 PM  [facility] [place], [place], [address] Phone: [phone]   Fax:  [phone]  Name: Remmy Crass MRN: [id number] Date of Birth: 1932/12/13

## 2019-11-24 ENCOUNTER — Encounter: Payer: Self-pay | Admitting: Physical Therapy

## 2019-11-24 ENCOUNTER — Other Ambulatory Visit: Payer: Self-pay

## 2019-11-24 ENCOUNTER — Other Ambulatory Visit: Payer: Self-pay | Admitting: Cardiology

## 2019-11-24 ENCOUNTER — Ambulatory Visit: Payer: PPO | Attending: Student | Admitting: Physical Therapy

## 2019-11-24 DIAGNOSIS — M6281 Muscle weakness (generalized): Secondary | ICD-10-CM | POA: Diagnosis not present

## 2019-11-24 DIAGNOSIS — R600 Localized edema: Secondary | ICD-10-CM | POA: Diagnosis not present

## 2019-11-24 DIAGNOSIS — G8929 Other chronic pain: Secondary | ICD-10-CM | POA: Insufficient documentation

## 2019-11-24 DIAGNOSIS — M25661 Stiffness of right knee, not elsewhere classified: Secondary | ICD-10-CM | POA: Insufficient documentation

## 2019-11-24 DIAGNOSIS — M25561 Pain in right knee: Secondary | ICD-10-CM | POA: Diagnosis not present

## 2019-11-24 NOTE — Therapy (Signed)
[facility] [place], [place], [address] Phone: [phone]   Fax:  [phone]  Physical Therapy Treatment  Patient Details  Name: Rhonda [name] MRN: [id number] Date of Birth: 08/07/1932 Referring Provider (PT): Gaynelle Arabian MD   Encounter Date: 11/24/2019   PT End of Session - 11/24/19 1303    Visit Number 13    Number of Visits 18    Date for PT Re-Evaluation 12/11/19    Authorization Type FOTO.    PT Start Time 1302    PT Stop Time 1345    PT Time Calculation (min) 43 min    Equipment Utilized During Treatment Other (comment)   FWW   Activity Tolerance Patient tolerated treatment well    Behavior During Therapy WFL for tasks assessed/performed           Past Medical History:  Diagnosis Date  . Arthritis   . Atrial fibrillation ([place]) 05/2015  . Dysrhythmia    afib  . Hypertension   . Osteopenia   . Syncope 04/2014    Past Surgical History:  Procedure Laterality Date  . BREAST EXCISIONAL BIOPSY Left   . BREAST EXCISIONAL BIOPSY Right   . COLONOSCOPY  2011  . KNEE ARTHROCENTESIS Bilateral   . KNEE ARTHROSCOPY     bil  . POLYPECTOMY  11-01-2009  . ROTATOR CUFF REPAIR    . SHOULDER SURGERY Left   . TOTAL KNEE ARTHROPLASTY Left   . TOTAL KNEE ARTHROPLASTY Right 10/12/2019   Procedure: TOTAL KNEE ARTHROPLASTY;  Surgeon: Gaynelle Arabian, MD;  Location: [facility];  Service: Orthopedics;  Laterality: Right;  37min  . VAGINAL HYSTERECTOMY     twice - vaginal and then oopherectomy    There were no vitals filed for this visit.   Subjective Assessment - 11/24/19 1303    Subjective COVID 19 screening performed on patient upon arrival. Patient reports she has more lateral R knee pain for unknown reason.    Pertinent History HTN, osteopenia, left RTC repair.    How long can you walk comfortably? Around house with a FWW.    Patient Stated Goals Get around without right knee pain.    Currently in Pain? Yes     Pain Score 7     Pain Location Knee    Pain Orientation Right;Lateral    Pain Descriptors / Indicators Discomfort;Sore    Pain Type Surgical pain    Pain Onset More than a month ago    Pain Frequency Constant              [facility] PT Assessment - 11/24/19 0001      Assessment   Medical Diagnosis Right total knee replacement.    Referring Provider (PT) Gaynelle Arabian MD    Onset Date/Surgical Date 10/12/19    Next MD Visit 12/29/2019      Precautions   Precaution Comments No ultrasound.      Restrictions   Weight Bearing Restrictions No                         [facility] Adult PT Treatment/Exercise - 11/24/19 0001      Knee/Hip Exercises: Aerobic   Nustep L5, seat 10-8 x15 min      Knee/Hip Exercises: Supine   Quad Sets Strengthening;Right;20 reps    Short Arc Quad Sets Strengthening;Right;2 sets;10 reps    Terminal Knee Extension Strengthening;Right;5 reps;Theraband    Theraband Level (Terminal Knee Extension) Level 3 (  Green)      Investment banker, corporate IFC    Electrical Stimulation Parameters 80-150 hz x10 min    Electrical Stimulation Goals Pain;Edema;Tone      Vasopneumatic   Number Minutes Vasopneumatic  10 minutes    Vasopnuematic Location  Knee    Vasopneumatic Pressure Medium    Vasopneumatic Temperature  34 for pain/edema      Manual Therapy   Manual Therapy Soft tissue mobilization;Edema management    Edema Management Gentle distal to proximal edema massage to reduce edema    Soft tissue mobilization STW of R ITB, hip adductor to reduce tone                       PT Long Term Goals - 10/27/19 1337      PT LONG TERM GOAL #1   Title Independent with a HEP.    Time 4    Period Weeks    Status Achieved      PT LONG TERM GOAL #2   Title Full right active knee extension in order to  normalize gait.    Time 4    Period Weeks    Status On-going      PT LONG TERM GOAL #3   Title Active knee flexion to 115 degrees+ so the patient can perform functional tasks and do so with pain not > 2-3/10.    Time 4    Period Weeks    Status On-going      PT LONG TERM GOAL #4   Title Increase right knee strength to a solid 4+/5 to provide good stability for accomplishment of functional activities.    Time 4    Period Weeks    Status On-going      PT LONG TERM GOAL #5   Title Perform a reciprocating stair gait with one railing with pain not > 2-3/10.    Time 4    Period Weeks    Status On-going                 Plan - 11/24/19 1343    Clinical Impression Statement Patient presented in clinic with increased pain and tone of R ITB and hip adductors. Patient guided through light quad activation/strengthening exercises but limited with TKE due to fatigue and discomfort. Patient arrived with B TED hose donned. Increased edema still present in BLE. Patient very tender with STW to R ITB, hip adductors. Normal modalities response noted following removal of the modalities.    Personal Factors and Comorbidities Comorbidity 1;Comorbidity 2    Comorbidities HTN, osteopenia, left RTC repair.    Examination-Activity Limitations Locomotion Level;Other;Transfers    Examination-Participation Restrictions Other    Stability/Clinical Decision Making Stable/Uncomplicated    Rehab Potential Excellent    PT Frequency 3x / week    PT Duration 4 weeks    PT Treatment/Interventions ADLs/Self Care Home Management;Cryotherapy;Electrical Stimulation;Moist Heat;Gait training;Stair training;Functional mobility training;Therapeutic activities;Therapeutic exercise;Neuromuscular re-education;Manual techniques;Patient/family education;Passive range of motion;Vasopneumatic Device    PT Next Visit Plan TKA protocol progression, Nustep, PROM, Vasopneumatic.    Consulted and Agree with Plan of Care Patient             Patient will benefit from skilled therapeutic intervention in order to improve the following deficits and impairments:  Pain, Abnormal gait, Decreased activity tolerance, Decreased strength, Increased  edema, Decreased range of motion  Visit Diagnosis: Chronic pain of right knee  Muscle weakness (generalized)  Stiffness of right knee, not elsewhere classified  Localized edema     Problem List Patient Active Problem List   Diagnosis Date Noted  . OA (osteoarthritis) of knee 10/12/2019  . Primary osteoarthritis of right knee 10/12/2019  . Essential hypertension 08/06/2018  . Insomnia 06/19/2018  . Overweight (BMI 25.0-29.9) 09/19/2017  . Low serum vitamin D 05/24/2016  . Osteopenia 05/23/2016  . Atrial fibrillation ([place]) 07/13/2015  . Syncope 05/05/2014    Standley Brooking, PTA 11/24/2019, 1:51 PM  [facility] [place], [place], [address] Phone: [phone]   Fax:  [phone]  Name: Rhonda [name] MRN: [id number] Date of Birth: [birth year]

## 2019-11-25 ENCOUNTER — Other Ambulatory Visit: Payer: Self-pay | Admitting: Cardiology

## 2019-11-25 MED ORDER — DILTIAZEM HCL ER COATED BEADS 360 MG PO CP24: 360.0000 mg | ORAL_CAPSULE | Freq: Every day | ORAL | 3 refills | Status: DC

## 2019-11-25 NOTE — Telephone Encounter (Signed)
*  STAT* If patient is at the pharmacy, call can be transferred to refill team.   1. Which medications need to be refilled? (please list name of each medication and dose if known)? diltiazem (CARDIZEM CD) 360 MG 24 hr capsule(Expired)  2. Which pharmacy/location (including street and city if local pharmacy) is medication to be sent to? [place], [place]  3. Do they need a 30 day or 90 day supply? 90 day supply   Patient has an appointment scheduled for 01/06/20 at 1:20pm.

## 2019-11-25 NOTE — Telephone Encounter (Signed)
Notified pt medication refill has been sent to pharmacy.

## 2019-11-26 ENCOUNTER — Encounter: Payer: Self-pay | Admitting: Physical Therapy

## 2019-11-26 ENCOUNTER — Ambulatory Visit: Payer: PPO | Admitting: Physical Therapy

## 2019-11-26 DIAGNOSIS — M6281 Muscle weakness (generalized): Secondary | ICD-10-CM

## 2019-11-26 DIAGNOSIS — R600 Localized edema: Secondary | ICD-10-CM

## 2019-11-26 DIAGNOSIS — M25561 Pain in right knee: Secondary | ICD-10-CM

## 2019-11-26 DIAGNOSIS — M25661 Stiffness of right knee, not elsewhere classified: Secondary | ICD-10-CM

## 2019-11-26 NOTE — Therapy (Signed)
[facility] [place], [place], [address] Phone: [phone]   Fax:  [phone]  Physical Therapy Treatment  Patient Details  Name: Rhonda [name] MRN: [id number] Date of Birth: [birth year] Referring Provider (PT): Gaynelle Arabian MD   Encounter Date: 11/26/2019   PT End of Session - 11/26/19 1314    Visit Number 14    Number of Visits 18    Date for PT Re-Evaluation 12/11/19    Authorization Type FOTO.    PT Start Time 1302    PT Stop Time 1351    PT Time Calculation (min) 49 min    Equipment Utilized During Treatment Other (comment)   FWW   Activity Tolerance Patient tolerated treatment well    Behavior During Therapy WFL for tasks assessed/performed           Past Medical History:  Diagnosis Date  . Arthritis   . Atrial fibrillation ([place]) 05/2015  . Dysrhythmia    afib  . Hypertension   . Osteopenia   . Syncope 04/2014    Past Surgical History:  Procedure Laterality Date  . BREAST EXCISIONAL BIOPSY Left   . BREAST EXCISIONAL BIOPSY Right   . COLONOSCOPY  2011  . KNEE ARTHROCENTESIS Bilateral   . KNEE ARTHROSCOPY     bil  . POLYPECTOMY  11-01-2009  . ROTATOR CUFF REPAIR    . SHOULDER SURGERY Left   . TOTAL KNEE ARTHROPLASTY Left   . TOTAL KNEE ARTHROPLASTY Right 10/12/2019   Procedure: TOTAL KNEE ARTHROPLASTY;  Surgeon: Gaynelle Arabian, MD;  Location: [facility];  Service: Orthopedics;  Laterality: Right;  32min  . VAGINAL HYSTERECTOMY     twice - vaginal and then oopherectomy    There were no vitals filed for this visit.   Subjective Assessment - 11/26/19 1301    Subjective COVID 19 screening performed on patient upon arrival. Reports that her knee still feels tender along medial knee with some heat. States she is not sleeping well even with new meds the MD gave her.    Pertinent History HTN, osteopenia, left RTC repair.    How long can you walk comfortably? Around house with a FWW.    Patient Stated  Goals Get around without right knee pain.    Currently in Pain? Yes    Pain Score 6     Pain Location Knee    Pain Orientation Right;Medial    Pain Descriptors / Indicators Sore;Discomfort    Pain Type Surgical pain    Pain Onset More than a month ago    Pain Frequency Constant              [facility] PT Assessment - 11/26/19 0001      Assessment   Medical Diagnosis Right total knee replacement.    Referring Provider (PT) Gaynelle Arabian MD    Onset Date/Surgical Date 10/12/19    Next MD Visit 12/29/2019      Precautions   Precaution Comments No ultrasound.      Restrictions   Weight Bearing Restrictions No                         [facility] Adult PT Treatment/Exercise - 11/26/19 0001      Knee/Hip Exercises: Aerobic   Nustep L5, seat 9-8 x15 min      Knee/Hip Exercises: Supine   Quad Sets Strengthening;Right;20 reps    Short Arc [employer] Strengthening;Right;2 sets;10 reps  Modalities   Modalities Psychologist, educational Location R knee    Electrical Stimulation Action IFC    Electrical Stimulation Parameters 80-150 hz x15 min    Electrical Stimulation Goals Pain;Edema;Tone      Vasopneumatic   Number Minutes Vasopneumatic  15 minutes    Vasopnuematic Location  Knee    Vasopneumatic Pressure Low    Vasopneumatic Temperature  34 for pain/edema      Manual Therapy   Manual Therapy Soft tissue mobilization;Edema management    Edema Management Gentle distal to proximal edema massage to reduce edema    Soft tissue mobilization STW of R ITB, hip adductor to reduce tone                       PT Long Term Goals - 10/27/19 1337      PT LONG TERM GOAL #1   Title Independent with a HEP.    Time 4    Period Weeks    Status Achieved      PT LONG TERM GOAL #2   Title Full right active knee extension in order to normalize gait.    Time 4    Period Weeks    Status On-going       PT LONG TERM GOAL #3   Title Active knee flexion to 115 degrees+ so the patient can perform functional tasks and do so with pain not > 2-3/10.    Time 4    Period Weeks    Status On-going      PT LONG TERM GOAL #4   Title Increase right knee strength to a solid 4+/5 to provide good stability for accomplishment of functional activities.    Time 4    Period Weeks    Status On-going      PT LONG TERM GOAL #5   Title Perform a reciprocating stair gait with one railing with pain not > 2-3/10.    Time 4    Period Weeks    Status On-going                 Plan - 11/26/19 1340    Clinical Impression Statement Patient presented in clinic with continued tenderness of R medial knee. Patient also continues to experience R HS, ITB and proximal hip adductor tightness which is very tender with light palpation as well. Improved quad activation and ROM noted during QS, SAQ today. Patient encouraged to use TED hose at home as she is only wearing them to PT. Notable BLE edema present upon observation. Normal modalities response noted following removal of the modalities. Patient is still very guarded and gentle with any movement.    Personal Factors and Comorbidities Comorbidity 1;Comorbidity 2    Comorbidities HTN, osteopenia, left RTC repair.    Examination-Activity Limitations Locomotion Level;Other;Transfers    Examination-Participation Restrictions Other    Stability/Clinical Decision Making Stable/Uncomplicated    Rehab Potential Excellent    PT Frequency 3x / week    PT Duration 4 weeks    PT Treatment/Interventions ADLs/Self Care Home Management;Cryotherapy;Electrical Stimulation;Moist Heat;Gait training;Stair training;Functional mobility training;Therapeutic activities;Therapeutic exercise;Neuromuscular re-education;Manual techniques;Patient/family education;Passive range of motion;Vasopneumatic Device    PT Next Visit Plan TKA protocol progression, Nustep, PROM, Vasopneumatic.     Consulted and Agree with Plan of Care Patient           Patient will benefit from skilled therapeutic intervention in order to improve the  following deficits and impairments:  Pain, Abnormal gait, Decreased activity tolerance, Decreased strength, Increased edema, Decreased range of motion  Visit Diagnosis: Chronic pain of right knee  Muscle weakness (generalized)  Stiffness of right knee, not elsewhere classified  Localized edema     Problem List Patient Active Problem List   Diagnosis Date Noted  . OA (osteoarthritis) of knee 10/12/2019  . Primary osteoarthritis of right knee 10/12/2019  . Essential hypertension 08/06/2018  . Insomnia 06/19/2018  . Overweight (BMI 25.0-29.9) 09/19/2017  . Low serum vitamin D 05/24/2016  . Osteopenia 05/23/2016  . Atrial fibrillation ([place]) 07/13/2015  . Syncope 05/05/2014    Standley Brooking, PTA 11/26/2019, 1:54 PM  [facility] [place], [place], [address] Phone: [phone]   Fax:  [phone]  Name: Rhonda [name] MRN: [id number] Date of Birth: February 19, 1933

## 2019-11-30 ENCOUNTER — Other Ambulatory Visit: Payer: Self-pay

## 2019-11-30 ENCOUNTER — Ambulatory Visit: Payer: PPO | Admitting: Physical Therapy

## 2019-11-30 DIAGNOSIS — M25661 Stiffness of right knee, not elsewhere classified: Secondary | ICD-10-CM

## 2019-11-30 DIAGNOSIS — M25561 Pain in right knee: Secondary | ICD-10-CM | POA: Diagnosis not present

## 2019-11-30 DIAGNOSIS — M6281 Muscle weakness (generalized): Secondary | ICD-10-CM

## 2019-11-30 DIAGNOSIS — R600 Localized edema: Secondary | ICD-10-CM

## 2019-11-30 NOTE — Therapy (Signed)
[facility] [place], [place], [address] Phone: [phone]   Fax:  [phone]  Physical Therapy Treatment  Patient Details  Name: Rhonda [name] MRN: [id number] Date of Birth: July 10, 1932 Referring Provider (PT): Gaynelle Arabian MD   Encounter Date: 11/30/2019    Past Medical History:  Diagnosis Date  . Arthritis   . Atrial fibrillation ([place]) 05/2015  . Dysrhythmia    afib  . Hypertension   . Osteopenia   . Syncope 04/2014    Past Surgical History:  Procedure Laterality Date  . BREAST EXCISIONAL BIOPSY Left   . BREAST EXCISIONAL BIOPSY Right   . COLONOSCOPY  2011  . KNEE ARTHROCENTESIS Bilateral   . KNEE ARTHROSCOPY     bil  . POLYPECTOMY  11-01-2009  . ROTATOR CUFF REPAIR    . SHOULDER SURGERY Left   . TOTAL KNEE ARTHROPLASTY Left   . TOTAL KNEE ARTHROPLASTY Right 10/12/2019   Procedure: TOTAL KNEE ARTHROPLASTY;  Surgeon: Gaynelle Arabian, MD;  Location: [facility];  Service: Orthopedics;  Laterality: Right;  37mn  . VAGINAL HYSTERECTOMY     twice - vaginal and then oopherectomy    There were no vitals filed for this visit.   Subjective Assessment - 11/30/19 1259    Subjective COVID 19 screening performed on patient upon arrival. Patient arrived with some ongoing tightness in knee.    Pertinent History HTN, osteopenia, left RTC repair.    How long can you walk comfortably? Around house with a FWW.    Patient Stated Goals Get around without right knee pain.    Currently in Pain? Yes    Pain Score 4     Pain Location Knee    Pain Orientation Right;Medial    Pain Descriptors / Indicators Discomfort;Sore    Pain Type Surgical pain    Pain Onset More than a month ago    Pain Frequency Constant    Aggravating Factors  prolong walking    Pain Relieving Factors at rest              [facility]      AROM   AROM Assessment Site Knee    Right/Left Knee Right    Right Knee Extension  -12    Right Knee Flexion 105      PROM   PROM Assessment Site Knee    Right/Left Knee Right    Right Knee Extension -9    Right Knee Flexion 110                         [facility] Adult PT Treatment/Exercise - 11/30/19 0001      Knee/Hip Exercises: Aerobic   Recumbent Bike x229m for ROM   patient only able to tolerate a few min yet able to perform    Nustep L5 x7m72m     EleAcupuncturistcation R knee    Electrical Stimulation Action IFC    Electrical Stimulation Parameters 80-150hz  x15m53m  Electrical Stimulation Goals Pain;Edema;Tone      Vasopneumatic   Number Minutes Vasopneumatic  15 minutes    Vasopnuematic Location  Knee    Vasopneumatic Pressure Low    Vasopneumatic Temperature  34 for pain/edema      Manual Therapy   Manual Therapy Passive ROM;Soft tissue mobilization    Manual therapy comments patella mobs in all directions    Soft tissue mobilization  STW of R ITB, hip adductor to reduce tone    Passive ROM PROM for right knee flexion and ext with holds to improve mobility, ossilations to relax                   PT Education - 11/30/19 1343    Education Details HEP for ROM    Person(s) Educated Patient    Methods Explanation;Demonstration;Handout    Comprehension Verbalized understanding;Returned demonstration               PT Long Term Goals - 11/30/19 1258      PT LONG TERM GOAL #1   Title Independent with a HEP.    Baseline issued today for ROM 11/30/19    Time 4    Period Weeks    Status Not Met      PT LONG TERM GOAL #2   Title Full right active knee extension in order to normalize gait.    Baseline AROM -12 degrees 11/30/19    Time 4    Period Weeks    Status On-going      PT LONG TERM GOAL #3   Title Active knee flexion to 115 degrees+ so the patient can perform functional tasks and do so with pain not > 2-3/10.    Baseline AROM 105 degrees 11/30/19    Time 4    Period Weeks    Status  On-going      PT LONG TERM GOAL #4   Title Increase right knee strength to a solid 4+/5 to provide good stability for accomplishment of functional activities.    Time 4    Period Weeks    Status On-going      PT LONG TERM GOAL #5   Title Perform a reciprocating stair gait with one railing with pain not > 2-3/10.    Time 4    Period Weeks    Status On-going                 Plan - 11/30/19 1345    Clinical Impression Statement Patient tolerated treatment well today. Patient able to progress to bike yet only able to perform 2 min due to reported fatigue. Today focused on ROM to right knee to improve mobility and education on daily self stretches to improve range with HEP provided. Patient continues to have pain with palpation and tightness for flexion and ext. Patient required ossilations to relax. Goals ongoing due to ROM, pain and strenth limitations.    Personal Factors and Comorbidities Comorbidity 1;Comorbidity 2    Comorbidities HTN, osteopenia, left RTC repair.    Examination-Activity Limitations Locomotion Level;Other;Transfers    Examination-Participation Restrictions Other    Stability/Clinical Decision Making Stable/Uncomplicated    Rehab Potential Excellent    PT Frequency 3x / week    PT Duration 4 weeks    PT Treatment/Interventions ADLs/Self Care Home Management;Cryotherapy;Electrical Stimulation;Moist Heat;Gait training;Stair training;Functional mobility training;Therapeutic activities;Therapeutic exercise;Neuromuscular re-education;Manual techniques;Patient/family education;Passive range of motion;Vasopneumatic Device    PT Next Visit Plan cont with POC for ROM and strength progression    Consulted and Agree with Plan of Care Patient           Patient will benefit from skilled therapeutic intervention in order to improve the following deficits and impairments:  Pain, Abnormal gait, Decreased activity tolerance, Decreased strength, Increased edema, Decreased  range of motion  Visit Diagnosis: Chronic pain of right knee  Muscle weakness (generalized)  Stiffness of right knee, not elsewhere classified  Localized edema     Problem List Patient Active Problem List   Diagnosis Date Noted  . OA (osteoarthritis) of knee 10/12/2019  . Primary osteoarthritis of right knee 10/12/2019  . Essential hypertension 08/06/2018  . Insomnia 06/19/2018  . Overweight (BMI 25.0-29.9) 09/19/2017  . Low serum vitamin D 05/24/2016  . Osteopenia 05/23/2016  . Atrial fibrillation ([place]) 07/13/2015  . Syncope 05/05/2014    Chablis Losh P, PTA 11/30/2019, 1:53 PM  [facility] [place], [place], [address] Phone: [phone]   Fax:  [phone]  Name: Treniece Holsclaw MRN: [id number] Date of Birth: 1932-11-26

## 2019-11-30 NOTE — Patient Instructions (Signed)
Knee Extension Mobilization: Towel Prop   With rolled towel under right ankle, place _1-5___ pound weight across knee. Hold __5+__ minutes. Repeat __2-3__ times per set. Do __2__ sets per session. Do __2-4__ sessions per day.     Sit and Reach - Foot Supported   Place one foot on table or chair. Straighten leg and attempt to keep it straight, then push down until feel a stretch. Hold _30__ seconds. Repeat __5-10_ times each leg, alternating. Do _2-4__ sessions per day.      Knee Flexion Stretch on Step  Place foot on step and lean forward until you feel a good stretch in front of knee.   hold 30 sec x 5-10 perform 2-4 x daily   Heel Slide   Bend left knee and pull heel toward buttocks. Use strap around foot and pull strap with arms to assist knee to bend further. Hold 10 secs.  Repeat __10-20__ times. Do __2-4__ sessions per day.

## 2019-12-02 ENCOUNTER — Ambulatory Visit: Payer: PPO | Admitting: Physical Therapy

## 2019-12-02 ENCOUNTER — Other Ambulatory Visit: Payer: Self-pay

## 2019-12-02 ENCOUNTER — Encounter: Payer: Self-pay | Admitting: Physical Therapy

## 2019-12-02 DIAGNOSIS — M25661 Stiffness of right knee, not elsewhere classified: Secondary | ICD-10-CM

## 2019-12-02 DIAGNOSIS — M25561 Pain in right knee: Secondary | ICD-10-CM | POA: Diagnosis not present

## 2019-12-02 DIAGNOSIS — M6281 Muscle weakness (generalized): Secondary | ICD-10-CM

## 2019-12-02 DIAGNOSIS — R600 Localized edema: Secondary | ICD-10-CM

## 2019-12-02 NOTE — Therapy (Signed)
[facility] [place], [place], [address] Phone: [phone]   Fax:  [phone]  Physical Therapy Treatment  Patient Details  Name: Rhonda [name] MRN: [id number] Date of Birth: 1932-10-12 Referring Provider (PT): Gaynelle Arabian MD   Encounter Date: 12/02/2019   PT End of Session - 12/02/19 1307    Visit Number 15    Number of Visits 18    Date for PT Re-Evaluation 12/11/19    Authorization Type FOTO.    PT Start Time 1303    PT Stop Time 1346    PT Time Calculation (min) 43 min    Equipment Utilized During Treatment Other (comment)   FWW   Activity Tolerance Patient tolerated treatment well    Behavior During Therapy WFL for tasks assessed/performed           Past Medical History:  Diagnosis Date  . Arthritis   . Atrial fibrillation ([place]) 05/2015  . Dysrhythmia    afib  . Hypertension   . Osteopenia   . Syncope 04/2014    Past Surgical History:  Procedure Laterality Date  . BREAST EXCISIONAL BIOPSY Left   . BREAST EXCISIONAL BIOPSY Right   . COLONOSCOPY  2011  . KNEE ARTHROCENTESIS Bilateral   . KNEE ARTHROSCOPY     bil  . POLYPECTOMY  11-01-2009  . ROTATOR CUFF REPAIR    . SHOULDER SURGERY Left   . TOTAL KNEE ARTHROPLASTY Left   . TOTAL KNEE ARTHROPLASTY Right 10/12/2019   Procedure: TOTAL KNEE ARTHROPLASTY;  Surgeon: Gaynelle Arabian, MD;  Location: [facility];  Service: Orthopedics;  Laterality: Right;  73mn  . VAGINAL HYSTERECTOMY     twice - vaginal and then oopherectomy    There were no vitals filed for this visit.   Subjective Assessment - 12/02/19 1303    Subjective COVID 19 screening performed on patient upon arrival. Patient arrived with reports of a catch in R hip. Doesn't know if the new exercises caused it or if she laid on it wrong.    Pertinent History HTN, osteopenia, left RTC repair.    How long can you walk comfortably? Around house with a FWW.    Patient Stated Goals Get around  without right knee pain.    Currently in Pain? Yes    Pain Score 7     Pain Location Back    Pain Orientation Right;Lower    Pain Descriptors / Indicators Discomfort    Pain Type Surgical pain    Pain Onset More than a month ago    Pain Frequency Constant              [facility] PT Assessment - 12/02/19 0001      Assessment   Medical Diagnosis Right total knee replacement.    Referring Provider (PT) FGaynelle ArabianMD    Onset Date/Surgical Date 10/12/19    Next MD Visit 12/29/2019      Precautions   Precaution Comments No ultrasound.      Restrictions   Weight Bearing Restrictions No                         [facility] Adult PT Treatment/Exercise - 12/02/19 0001      Knee/Hip Exercises: Aerobic   Nustep L5, seat 8-6 x15 min      Modalities   Modalities Vasopneumatic      Vasopneumatic   Number Minutes Vasopneumatic  15 minutes    Vasopnuematic Location  Knee    Vasopneumatic Pressure Low    Vasopneumatic Temperature  34 for pain/edema      Manual Therapy   Manual Therapy Soft tissue mobilization;Edema management    Edema Management Gentle distal to proximal edema massage to reduce edema    Soft tissue mobilization STW of R ITB, hip adductor to reduce tone; R patella and incision mobilizations to promote proper mobility                       PT Long Term Goals - 11/30/19 1258      PT LONG TERM GOAL #1   Title Independent with a HEP.    Baseline issued today for ROM 11/30/19    Time 4    Period Weeks    Status Not Met      PT LONG TERM GOAL #2   Title Full right active knee extension in order to normalize gait.    Baseline AROM -12 degrees 11/30/19    Time 4    Period Weeks    Status On-going      PT LONG TERM GOAL #3   Title Active knee flexion to 115 degrees+ so the patient can perform functional tasks and do so with pain not > 2-3/10.    Baseline AROM 105 degrees 11/30/19    Time 4    Period Weeks    Status On-going      PT LONG TERM  GOAL #4   Title Increase right knee strength to a solid 4+/5 to provide good stability for accomplishment of functional activities.    Time 4    Period Weeks    Status On-going      PT LONG TERM GOAL #5   Title Perform a reciprocating stair gait with one railing with pain not > 2-3/10.    Time 4    Period Weeks    Status On-going                 Plan - 12/02/19 1343    Clinical Impression Statement Patient presented in clinic with reports of more R low back pain/ hip pain. Patient able to indicate that pain is more over the R SI joint. Patient still very tender to palpation of R hip adductors, ITB and over incision. Limited R patellar mobility noted as well. Patient very sensitive to manual therapy today. Normal vasopneumatic response noted following removal of the modality.    Personal Factors and Comorbidities Comorbidity 1;Comorbidity 2    Comorbidities HTN, osteopenia, left RTC repair.    Examination-Activity Limitations Locomotion Level;Other;Transfers    Examination-Participation Restrictions Other    Stability/Clinical Decision Making Stable/Uncomplicated    Rehab Potential Excellent    PT Frequency 3x / week    PT Duration 4 weeks    PT Treatment/Interventions ADLs/Self Care Home Management;Cryotherapy;Electrical Stimulation;Moist Heat;Gait training;Stair training;Functional mobility training;Therapeutic activities;Therapeutic exercise;Neuromuscular re-education;Manual techniques;Patient/family education;Passive range of motion;Vasopneumatic Device    PT Next Visit Plan cont with POC for ROM and strength progression    Consulted and Agree with Plan of Care Patient           Patient will benefit from skilled therapeutic intervention in order to improve the following deficits and impairments:  Pain, Abnormal gait, Decreased activity tolerance, Decreased strength, Increased edema, Decreased range of motion  Visit Diagnosis: Chronic pain of right knee  Muscle weakness  (generalized)  Stiffness of right knee, not elsewhere classified  Localized edema     Problem  List Patient Active Problem List   Diagnosis Date Noted  . OA (osteoarthritis) of knee 10/12/2019  . Primary osteoarthritis of right knee 10/12/2019  . Essential hypertension 08/06/2018  . Insomnia 06/19/2018  . Overweight (BMI 25.0-29.9) 09/19/2017  . Low serum vitamin D 05/24/2016  . Osteopenia 05/23/2016  . Atrial fibrillation ([place]) 07/13/2015  . Syncope 05/05/2014    Standley Brooking, PTA 12/02/2019, 2:01 PM  [facility] [place], [place], [address] Phone: [phone]   Fax:  [phone]  Name: Rhonda [name] MRN: [id number] Date of Birth: 02/26/1933

## 2019-12-07 ENCOUNTER — Ambulatory Visit: Payer: PPO | Admitting: Physical Therapy

## 2019-12-07 ENCOUNTER — Encounter: Payer: Self-pay | Admitting: Physical Therapy

## 2019-12-07 ENCOUNTER — Other Ambulatory Visit: Payer: Self-pay

## 2019-12-07 DIAGNOSIS — G8929 Other chronic pain: Secondary | ICD-10-CM

## 2019-12-07 DIAGNOSIS — M25661 Stiffness of right knee, not elsewhere classified: Secondary | ICD-10-CM

## 2019-12-07 DIAGNOSIS — M6281 Muscle weakness (generalized): Secondary | ICD-10-CM

## 2019-12-07 DIAGNOSIS — M25561 Pain in right knee: Secondary | ICD-10-CM | POA: Diagnosis not present

## 2019-12-07 DIAGNOSIS — R600 Localized edema: Secondary | ICD-10-CM

## 2019-12-07 NOTE — Therapy (Signed)
[facility] [place], [place], [address] Phone: [phone]   Fax:  [phone]  Physical Therapy Treatment  Patient Details  Name: Rhonda [name] MRN: [id number] Date of Birth: 02-Nov-1932 Referring Provider (PT): Gaynelle Arabian MD   Encounter Date: 12/07/2019   PT End of Session - 12/07/19 1306    Visit Number 16    Number of Visits 18    Date for PT Re-Evaluation 12/11/19    Authorization Type FOTO.    PT Start Time 1301    PT Stop Time 1347    PT Time Calculation (min) 46 min    Equipment Utilized During Treatment Other (comment)   FWW   Activity Tolerance Patient tolerated treatment well    Behavior During Therapy WFL for tasks assessed/performed           Past Medical History:  Diagnosis Date  . Arthritis   . Atrial fibrillation ([place]) 05/2015  . Dysrhythmia    afib  . Hypertension   . Osteopenia   . Syncope 04/2014    Past Surgical History:  Procedure Laterality Date  . BREAST EXCISIONAL BIOPSY Left   . BREAST EXCISIONAL BIOPSY Right   . COLONOSCOPY  2011  . KNEE ARTHROCENTESIS Bilateral   . KNEE ARTHROSCOPY     bil  . POLYPECTOMY  11-01-2009  . ROTATOR CUFF REPAIR    . SHOULDER SURGERY Left   . TOTAL KNEE ARTHROPLASTY Left   . TOTAL KNEE ARTHROPLASTY Right 10/12/2019   Procedure: TOTAL KNEE ARTHROPLASTY;  Surgeon: Gaynelle Arabian, MD;  Location: [facility];  Service: Orthopedics;  Laterality: Right;  41mn  . VAGINAL HYSTERECTOMY     twice - vaginal and then oopherectomy    There were no vitals filed for this visit.   Subjective Assessment - 12/07/19 1305    Subjective COVID 19 screening performed on patient upon arrival. Patient arrived with reports of some tenderness but minimal. Reports she slept better last night.    Pertinent History HTN, osteopenia, left RTC repair.    How long can you walk comfortably? Around house with a FWW.    Patient Stated Goals Get around without right knee pain.     Currently in Pain? Yes    Pain Score --   "not very much"   Pain Location Knee    Pain Orientation Right    Pain Descriptors / Indicators Tender    Pain Type Surgical pain    Pain Onset More than a month ago    Pain Frequency Constant              [facility] PT Assessment - 12/07/19 0001      Assessment   Medical Diagnosis Right total knee replacement.    Referring Provider (PT) FGaynelle ArabianMD    Onset Date/Surgical Date 10/12/19    Next MD Visit 12/29/2019      Precautions   Precaution Comments No ultrasound.      Restrictions   Weight Bearing Restrictions No                         [facility] Adult PT Treatment/Exercise - 12/07/19 0001      Knee/Hip Exercises: Aerobic   Nustep L5, seat 9-8 x15 min      Knee/Hip Exercises: Standing   Forward Lunges Right;2 sets;10 reps;3 seconds   push off RLE for closed chain knee extension   Forward Step Up Right;2 sets;10 reps;Hand Hold: 2;Step  Height: 4"    Step Down Right;2 sets;10 reps;Hand Hold: 2;Step Height: 4"      Knee/Hip Exercises: Seated   Long Arc Quad Strengthening;Right;15 reps;Weights      Knee/Hip Exercises: Supine   Short Arc Engineer, site IFC    Electrical Stimulation Parameters 80-150 hzx15 min    Electrical Stimulation Goals Pain;Edema      Vasopneumatic   Number Minutes Vasopneumatic  15 minutes    Vasopnuematic Location  Knee    Vasopneumatic Pressure Low    Vasopneumatic Temperature  34 for pain/edema                       PT Long Term Goals - 11/30/19 1258      PT LONG TERM GOAL #1   Title Independent with a HEP.    Baseline issued today for ROM 11/30/19    Time 4    Period Weeks    Status Not Met      PT LONG TERM GOAL #2   Title Full right active knee extension in order to  normalize gait.    Baseline AROM -12 degrees 11/30/19    Time 4    Period Weeks    Status On-going      PT LONG TERM GOAL #3   Title Active knee flexion to 115 degrees+ so the patient can perform functional tasks and do so with pain not > 2-3/10.    Baseline AROM 105 degrees 11/30/19    Time 4    Period Weeks    Status On-going      PT LONG TERM GOAL #4   Title Increase right knee strength to a solid 4+/5 to provide good stability for accomplishment of functional activities.    Time 4    Period Weeks    Status On-going      PT LONG TERM GOAL #5   Title Perform a reciprocating stair gait with one railing with pain not > 2-3/10.    Time 4    Period Weeks    Status On-going                 Plan - 12/07/19 1346    Clinical Impression Statement Patient presented in clinic with less reports of pain although some tenderness still present in R medial knee. Patient able to tolerate more standing exercises today with less reports of pain or soreness. Limited R patella mobility noted again today as well. Normal modalities response noted following removal of the modalties.    Personal Factors and Comorbidities Comorbidity 1;Comorbidity 2    Comorbidities HTN, osteopenia, left RTC repair.    Examination-Activity Limitations Locomotion Level;Other;Transfers    Examination-Participation Restrictions Other    Stability/Clinical Decision Making Stable/Uncomplicated    Rehab Potential Excellent    PT Frequency 3x / week    PT Duration 4 weeks    PT Treatment/Interventions ADLs/Self Care Home Management;Cryotherapy;Electrical Stimulation;Moist Heat;Gait training;Stair training;Functional mobility training;Therapeutic activities;Therapeutic exercise;Neuromuscular re-education;Manual techniques;Patient/family education;Passive range of motion;Vasopneumatic Device    PT Next Visit Plan cont with POC for ROM and strength progression    Consulted and Agree with Plan of Care Patient            Patient will benefit from skilled therapeutic intervention in order to improve the  following deficits and impairments:  Pain, Abnormal gait, Decreased activity tolerance, Decreased strength, Increased edema, Decreased range of motion  Visit Diagnosis: Chronic pain of right knee  Muscle weakness (generalized)  Stiffness of right knee, not elsewhere classified  Localized edema     Problem List Patient Active Problem List   Diagnosis Date Noted  . OA (osteoarthritis) of knee 10/12/2019  . Primary osteoarthritis of right knee 10/12/2019  . Essential hypertension 08/06/2018  . Insomnia 06/19/2018  . Overweight (BMI 25.0-29.9) 09/19/2017  . Low serum vitamin D 05/24/2016  . Osteopenia 05/23/2016  . Atrial fibrillation ([place]) 07/13/2015  . Syncope 05/05/2014    Standley Brooking, PTA 12/07/2019, 2:18 PM  [facility] [address] [place], [place], [address] Phone: [phone]   Fax:  [phone]  Name: Rhonda [name] MRN: [id number] Date of Birth: 1933/02/14

## 2019-12-09 ENCOUNTER — Encounter: Payer: Self-pay | Admitting: Physical Therapy

## 2019-12-09 ENCOUNTER — Other Ambulatory Visit: Payer: Self-pay

## 2019-12-09 ENCOUNTER — Ambulatory Visit: Payer: PPO | Admitting: Physical Therapy

## 2019-12-09 DIAGNOSIS — M25561 Pain in right knee: Secondary | ICD-10-CM | POA: Diagnosis not present

## 2019-12-09 DIAGNOSIS — M6281 Muscle weakness (generalized): Secondary | ICD-10-CM

## 2019-12-09 DIAGNOSIS — M25661 Stiffness of right knee, not elsewhere classified: Secondary | ICD-10-CM

## 2019-12-09 DIAGNOSIS — R600 Localized edema: Secondary | ICD-10-CM

## 2019-12-09 DIAGNOSIS — G8929 Other chronic pain: Secondary | ICD-10-CM

## 2019-12-09 NOTE — Therapy (Addendum)
[facility] [place], [place], [address] Phone: [phone]   Fax:  [phone]  Physical Therapy Treatment PHYSICAL THERAPY DISCHARGE SUMMARY  Visits from Start of Care: 17  Current functional level related to goals / functional outcomes: See below   Remaining deficits: See goals   Education / Equipment: HEP Plan: Patient agrees to discharge.  Patient goals were partially met. Patient is being discharged due to not returning since the last visit.  ?????  Gabriela Eves, PT, DPT   Patient Details  Name: Rhonda [name] MRN: [id number] Date of Birth: [birth year] Referring Provider (PT): Gaynelle Arabian MD   Encounter Date: 12/09/2019   PT End of Session - 12/09/19 1304    Visit Number 17    Number of Visits 18    Date for PT Re-Evaluation 12/11/19    Authorization Type FOTO.    PT Start Time 1302    PT Stop Time 1352    PT Time Calculation (min) 50 min    Equipment Utilized During Treatment Other (comment)   FWW   Activity Tolerance Patient tolerated treatment well    Behavior During Therapy WFL for tasks assessed/performed           Past Medical History:  Diagnosis Date  . Arthritis   . Atrial fibrillation ([place]) 05/2015  . Dysrhythmia    afib  . Hypertension   . Osteopenia   . Syncope 04/2014    Past Surgical History:  Procedure Laterality Date  . BREAST EXCISIONAL BIOPSY Left   . BREAST EXCISIONAL BIOPSY Right   . COLONOSCOPY  2011  . KNEE ARTHROCENTESIS Bilateral   . KNEE ARTHROSCOPY     bil  . POLYPECTOMY  11-01-2009  . ROTATOR CUFF REPAIR    . SHOULDER SURGERY Left   . TOTAL KNEE ARTHROPLASTY Left   . TOTAL KNEE ARTHROPLASTY Right 10/12/2019   Procedure: TOTAL KNEE ARTHROPLASTY;  Surgeon: Gaynelle Arabian, MD;  Location: [facility];  Service: Orthopedics;  Laterality: Right;  23mn  . VAGINAL HYSTERECTOMY     twice - vaginal and then oopherectomy    There were no vitals filed for this  visit.   Subjective Assessment - 12/09/19 1303    Subjective COVID 19 screening performed on patient upon arrival. Patient arrived with reports of aching in medial R knee. Has a stationary bike at home she's been using.    Pertinent History HTN, osteopenia, left RTC repair.    How long can you walk comfortably? Around house with a FWW.    Patient Stated Goals Get around without right knee pain.    Currently in Pain? Yes    Pain Score 4     Pain Location Knee    Pain Orientation Right;Medial    Pain Descriptors / Indicators Aching    Pain Type Surgical pain    Pain Onset More than a month ago    Pain Frequency Constant              [facility] PT Assessment - 12/09/19 0001      Assessment   Medical Diagnosis Right total knee replacement.    Referring Provider (PT) FGaynelle ArabianMD    Onset Date/Surgical Date 10/12/19    Next MD Visit 12/29/2019      Precautions   Precaution Comments No ultrasound.      Restrictions   Weight Bearing Restrictions No      ROM / Strength   AROM / PROM / Strength AROM;Strength  AROM   Overall AROM  Deficits;Within functional limits for tasks performed    AROM Assessment Site Knee    Right/Left Knee Right    Right Knee Extension 9    Right Knee Flexion 115      Strength   Overall Strength Deficits    Strength Assessment Site Knee    Right/Left Knee Right    Right Knee Flexion 4/5    Right Knee Extension 4/5                         [facility] Adult PT Treatment/Exercise - 12/09/19 0001      Knee/Hip Exercises: Aerobic   Nustep L5, seat 8 x16 min      Knee/Hip Exercises: Standing   Forward Lunges Right;2 sets;10 reps;3 seconds    Forward Lunges Limitations push off with RLE for TKE    Forward Step Up Right;2 sets;10 reps;Hand Hold: 2;Step Height: 4"    Step Down Right;2 sets;10 reps;Hand Hold: 2;Step Height: 4"    Rocker Board 2 minutes      Modalities   Modalities Research scientist (medical) Location R knee    Electrical Stimulation Action IFC    Electrical Stimulation Parameters 80-150 hz x15 min    Electrical Stimulation Goals Pain;Edema      Vasopneumatic   Number Minutes Vasopneumatic  15 minutes    Vasopnuematic Location  Knee    Vasopneumatic Pressure Low    Vasopneumatic Temperature  34 for pain/edema                       PT Long Term Goals - 12/09/19 1343      PT LONG TERM GOAL #1   Title Independent with a HEP.    Baseline issued today for ROM 11/30/19    Time 4    Period Weeks    Status Not Met      PT LONG TERM GOAL #2   Title Full right active knee extension in order to normalize gait.    Baseline AROM -12 degrees 11/30/19    Time 4    Period Weeks    Status Not Met      PT LONG TERM GOAL #3   Title Active knee flexion to 115 degrees+ so the patient can perform functional tasks and do so with pain not > 2-3/10.    Baseline AROM 105 degrees 11/30/19    Time 4    Period Weeks    Status Achieved      PT LONG TERM GOAL #4   Title Increase right knee strength to a solid 4+/5 to provide good stability for accomplishment of functional activities.    Time 4    Period Weeks    Status Not Met      PT LONG TERM GOAL #5   Title Perform a reciprocating stair gait with one railing with pain not > 2-3/10.    Time 4    Period Weeks    Status Not Met                 Plan - 12/09/19 1354    Clinical Impression Statement Patient presented in clinic with reports of medial R knee aching. Patient fatigues fairly easily with standing exercises with emphasis on functional training as well as TKE. AROM R knee extension has progressed to 9 deg from neutral and able to  achieve 115 deg fleixon. Increased edema still present in RLE with bubbly blisterish type lesions from B mid calf to ankle regions. Patient still very tender and self restricting for forward step up even to small height. Normal modalitiies response  noted following removal of the modalities. Patient provided new HEP as she will take a break from PT as she will not have a ride. Patient was understanding of all instructions for HEP as well as continuing on stationary bike and icing.    Personal Factors and Comorbidities Comorbidity 1;Comorbidity 2    Comorbidities HTN, osteopenia, left RTC repair.    Examination-Activity Limitations Locomotion Level;Other;Transfers    Examination-Participation Restrictions Other    Stability/Clinical Decision Making Stable/Uncomplicated    Rehab Potential Excellent    PT Frequency 3x / week    PT Duration 4 weeks    PT Treatment/Interventions ADLs/Self Care Home Management;Cryotherapy;Electrical Stimulation;Moist Heat;Gait training;Stair training;Functional mobility training;Therapeutic activities;Therapeutic exercise;Neuromuscular re-education;Manual techniques;Patient/family education;Passive range of motion;Vasopneumatic Device    PT Next Visit Plan Hold PT.    Consulted and Agree with Plan of Care Patient           Patient will benefit from skilled therapeutic intervention in order to improve the following deficits and impairments:  Pain, Abnormal gait, Decreased activity tolerance, Decreased strength, Increased edema, Decreased range of motion  Visit Diagnosis: Chronic pain of right knee  Muscle weakness (generalized)  Stiffness of right knee, not elsewhere classified  Localized edema     Problem List Patient Active Problem List   Diagnosis Date Noted  . OA (osteoarthritis) of knee 10/12/2019  . Primary osteoarthritis of right knee 10/12/2019  . Essential hypertension 08/06/2018  . Insomnia 06/19/2018  . Overweight (BMI 25.0-29.9) 09/19/2017  . Low serum vitamin D 05/24/2016  . Osteopenia 05/23/2016  . Atrial fibrillation ([place]) 07/13/2015  . Syncope 05/05/2014    Standley Brooking, PTA 12/09/19 2:13 PM   [facility] [place], [place], [address] Phone: [phone]   Fax:  [phone]  Name: Rosiland Sen MRN: [id number] Date of Birth: [birth year]

## 2019-12-23 ENCOUNTER — Ambulatory Visit
Admission: RE | Admit: 2019-12-23 | Discharge: 2019-12-23 | Disposition: A | Discharge status: Home / Self Care | Payer: PPO | Source: Ambulatory Visit | Attending: Family Medicine | Admitting: Family Medicine

## 2019-12-23 ENCOUNTER — Other Ambulatory Visit: Payer: Self-pay

## 2019-12-23 DIAGNOSIS — Z1231 Encounter for screening mammogram for malignant neoplasm of breast: Secondary | ICD-10-CM

## 2020-01-05 DIAGNOSIS — Z7189 Other specified counseling: Secondary | ICD-10-CM | POA: Insufficient documentation

## 2020-01-05 NOTE — Progress Notes (Signed)
Cardiology Office Note   Date:  01/06/2020   ID:  Rhonda [name], DOB 12-27-1932, MRN [id number]  PCP:  Dettinger, Fransisca Kaufmann, MD  Cardiologist:   [name], MD   Chief Complaint  Patient presents with  . Leg Swelling      History of Present Illness: Rhonda [name] is a 86 y.o. female who with atrial fib.  She was in the hospital in Jan with this.  She was treated with flecainide, Eliquis and Diltiazem.   In Feb she had a POET ([employer] Exercise Treadmill). In late June 2020 she was in the ED with SVT. She was treated with adenosine.   Since I last saw her she has knee surgery.    She is getting around very slowly as she has had continued knee pain.  This was 12 weeks ago.  She did do physical therapy.  Since surgery she has had increased lower extremity in the right leg which would not like it was operated on greater than the left leg.  She is not had any new shortness of breath, PND or orthopnea.  She denies any new palpitations, presyncope or syncope.  She has been on her anticoagulant.  She has had no further tachyarrhythmias.   Past Medical History:  Diagnosis Date  . Arthritis   . Atrial fibrillation ([place]) 05/2015  . Dysrhythmia    afib  . Hypertension   . Osteopenia   . Syncope 04/2014    Past Surgical History:  Procedure Laterality Date  . BREAST EXCISIONAL BIOPSY Left   . BREAST EXCISIONAL BIOPSY Right   . COLONOSCOPY  2011  . KNEE ARTHROCENTESIS Bilateral   . KNEE ARTHROSCOPY     bil  . POLYPECTOMY  11-01-2009  . ROTATOR CUFF REPAIR    . SHOULDER SURGERY Left   . TOTAL KNEE ARTHROPLASTY Left   . TOTAL KNEE ARTHROPLASTY Right 10/12/2019   Procedure: TOTAL KNEE ARTHROPLASTY;  Surgeon: Gaynelle Arabian, MD;  Location: [facility];  Service: Orthopedics;  Laterality: Right;  55min  . VAGINAL HYSTERECTOMY     twice - vaginal and then oopherectomy     Current Outpatient Medications  Medication Sig Dispense Refill  . acetaminophen (TYLENOL) 650 MG CR  tablet Take 1,300 mg by mouth every 8 (eight) hours as needed for pain.     . cholecalciferol (VITAMIN D3) 25 MCG (1000 UT) tablet Take 2,000 Units by mouth 2 (two) times daily.    Marland Kitchen diltiazem (CARDIZEM CD) 360 MG 24 hr capsule Take 1 capsule (360 mg total) by mouth daily. 90 capsule 3  . ELIQUIS 5 MG TABS tablet TAKE ONE TABLET BY MOUTH TWICE DAILY (Patient taking differently: Take 5 mg by mouth 2 (two) times daily. ) 180 tablet 3  . flecainide (TAMBOCOR) 50 MG tablet TAKE ONE TABLET BY MOUTH TWICE DAILY 180 tablet 0  . furosemide (LASIX) 40 MG tablet Take 1 tablet (40 mg total) by mouth as directed. 30 tablet 1  . methocarbamol (ROBAXIN) 500 MG tablet Take 1 tablet (500 mg total) by mouth every 6 (six) hours as needed for muscle spasms. 40 tablet 0  . oxyCODONE (OXY IR/ROXICODONE) 5 MG immediate release tablet Take 1-2 tablets (5-10 mg total) by mouth every 6 (six) hours as needed for severe pain. 56 tablet 0  . traMADol (ULTRAM) 50 MG tablet Take 1-2 tablets (50-100 mg total) by mouth every 6 (six) hours as needed for moderate pain. 40 tablet 0  . zolpidem (  AMBIEN) 5 MG tablet Take 1 tablet (5 mg total) by mouth at bedtime as needed for sleep. 30 tablet 5   No current facility-administered medications for this visit.    Allergies:   Patient has no known allergies.    ROS:  Please see the history of present illness.   Otherwise, review of systems are positive for none.   All other systems are reviewed and negative.    PHYSICAL EXAM: VS:  BP 130/70   Pulse 76   Ht 5\' 5"  (1.651 m)   Wt 161 lb (73 kg)   BMI 26.79 kg/m  , BMI Body mass index is 26.79 kg/m. GENERAL:  Well appearing NECK:  No jugular venous distention, waveform within normal limits, carotid upstroke brisk and symmetric, no bruits, no thyromegaly LUNGS:  Clear to auscultation bilaterally CHEST:  Unremarkable HEART:  PMI not displaced or sustained,S1 and S2 within normal limits, no S3, no S4, no clicks, no rubs, no  murmurs ABD:  Flat, positive bowel sounds normal in frequency in pitch, no bruits, no rebound, no guarding, no midline pulsatile mass, no hepatomegaly, no splenomegaly EXT:  2 plus pulses throughout, no edema, no cyanosis no clubbing   EKG:  EKG not ordered today.   Recent Labs: 10/05/2019: ALT 12 10/13/2019: BUN 20; Creatinine, Ser 1.12; Hemoglobin 12.2; Platelets 259; Potassium 4.6; Sodium 134    Lipid Panel    Component Value Date/Time   CHOL 179 08/28/2019 1109   TRIG 136 08/28/2019 1109   HDL 52 08/28/2019 1109   CHOLHDL 3.4 08/28/2019 1109   LDLCALC 103 (H) 08/28/2019 1109      Wt Readings from Last 3 Encounters:  01/06/20 161 lb (73 kg)  10/12/19 171 lb 2 oz (77.6 kg)  10/05/19 171 lb 2 oz (77.6 kg)      Other studies Reviewed: Additional studies/ records that were reviewed today include:   None. Review of the above records demonstrates:  NA   ASSESSMENT AND PLAN:  Persistent atrial fibrillation:      She has had no symptomatic recurrence.  No change in therapy.  She tolerates her anticoagulation.  Hypertension:    Blood pressure is controlled.  No change in therapy.   SVT:  She has had no further episodes.  She will continue with the meds as listed.  She has done well since she has been on the flecainide.    Covid education: She has had her vaccine.  Swelling: She has increased lower extremity swelling since surgery.  She has been on anticoagulation and I do not strongly suspect a DVT.  I am going to start with 40 mg of Lasix for 4 days only.  She has had some renal insufficiency so I will check a basic metabolic profile in 1 week.  If she is not improved I probably will do Dopplers and she will call me.   Current medicines are reviewed at length with the patient today.  The patient does not have concerns regarding medicines.  The following changes have been made: As above  Labs/ tests ordered today include:   Orders Placed This Encounter  Procedures  .  Basic metabolic panel     Disposition:   FU with me in two months.    Signed, [name], MD  01/06/2020 2:03 PM    [place]

## 2020-01-06 ENCOUNTER — Other Ambulatory Visit: Payer: Self-pay

## 2020-01-06 ENCOUNTER — Ambulatory Visit: Payer: PPO | Admitting: Cardiology

## 2020-01-06 ENCOUNTER — Encounter: Payer: Self-pay | Admitting: Cardiology

## 2020-01-06 VITALS — BP 130/70 | HR 76 | Ht 65.0 in | Wt 161.0 lb

## 2020-01-06 DIAGNOSIS — I4819 Other persistent atrial fibrillation: Secondary | ICD-10-CM

## 2020-01-06 DIAGNOSIS — Z79899 Other long term (current) drug therapy: Secondary | ICD-10-CM | POA: Diagnosis not present

## 2020-01-06 DIAGNOSIS — I10 Essential (primary) hypertension: Secondary | ICD-10-CM | POA: Diagnosis not present

## 2020-01-06 DIAGNOSIS — I471 Supraventricular tachycardia: Secondary | ICD-10-CM | POA: Diagnosis not present

## 2020-01-06 DIAGNOSIS — Z7189 Other specified counseling: Secondary | ICD-10-CM

## 2020-01-06 MED ORDER — FUROSEMIDE 40 MG PO TABS
40.0000 mg | ORAL_TABLET | ORAL | 1 refills | Status: DC
Start: 2020-01-06 — End: 2020-02-17

## 2020-01-06 NOTE — Patient Instructions (Signed)
Medication Instructions:  Please increase Furosemide to 40 mg a day for 4 days then return to your normal dose. Continue all other medications as listed.  *If you need a refill on your cardiac medications before your next appointment, please call your pharmacy*  Lab Work: Please have blood work in 1 week (BMP at [facility]) If you have labs (blood work) drawn today and your tests are completely normal, you will receive your results only by:  [place] (if you have MyChart) OR  A paper copy in the mail If you have any lab test that is abnormal or we need to change your treatment, we will call you to review the results.  Follow-Up: At [facility], you and your health needs are our priority.  As part of our continuing mission to provide you with exceptional heart care, we have created designated Provider Care Teams.  These Care Teams include your primary Cardiologist (physician) and Advanced Practice Providers (APPs -  Physician Assistants and Nurse Practitioners) who all work together to provide you with the care you need, when you need it.  We recommend signing up for the patient portal called "MyChart".  Sign up information is provided on this After Visit Summary.  MyChart is used to connect with patients for Virtual Visits (Telemedicine).  Patients are able to view lab/test results, encounter notes, upcoming appointments, etc.  Non-urgent messages can be sent to your provider as well.   To learn more about what you can do with MyChart, go to [email or web address].    Your next appointment:   2 month(s)  The format for your next appointment:   In Person  Provider:   [name], MD   Thank you for choosing [facility]!!

## 2020-01-12 ENCOUNTER — Other Ambulatory Visit: Payer: PPO

## 2020-01-12 ENCOUNTER — Other Ambulatory Visit: Payer: Self-pay

## 2020-01-12 DIAGNOSIS — I4819 Other persistent atrial fibrillation: Secondary | ICD-10-CM | POA: Diagnosis not present

## 2020-01-12 DIAGNOSIS — Z79899 Other long term (current) drug therapy: Secondary | ICD-10-CM | POA: Diagnosis not present

## 2020-01-12 DIAGNOSIS — I10 Essential (primary) hypertension: Secondary | ICD-10-CM | POA: Diagnosis not present

## 2020-01-12 LAB — BASIC METABOLIC PANEL
BUN/Creatinine Ratio: 19 (ref 12–28)
BUN: 18 mg/dL (ref 8–27)
CO2: 25 mmol/L (ref 20–29)
Calcium: 9.5 mg/dL (ref 8.7–10.3)
Chloride: 97 mmol/L (ref 96–106)
Creatinine, Ser: 0.93 mg/dL (ref 0.57–1.00)
GFR calc Af Amer: 65 mL/min/{1.73_m2} (ref >59)
GFR calc non Af Amer: 56 mL/min/{1.73_m2} — ABNORMAL LOW (ref >59)
Glucose: 88 mg/dL (ref 65–99)
Potassium: 4.7 mmol/L (ref 3.5–5.2)
Sodium: 136 mmol/L (ref 134–144)

## 2020-01-14 IMAGING — MG DIGITAL SCREENING BILATERAL MAMMOGRAM WITH TOMO AND CAD
8 series · 8 of 24 positions shown · non-contrast
Comparison: Previous exam(s).

CLINICAL DATA: Screening.

EXAM:
DIGITAL SCREENING BILATERAL MAMMOGRAM WITH TOMO AND CAD

[R CC synth-2D]
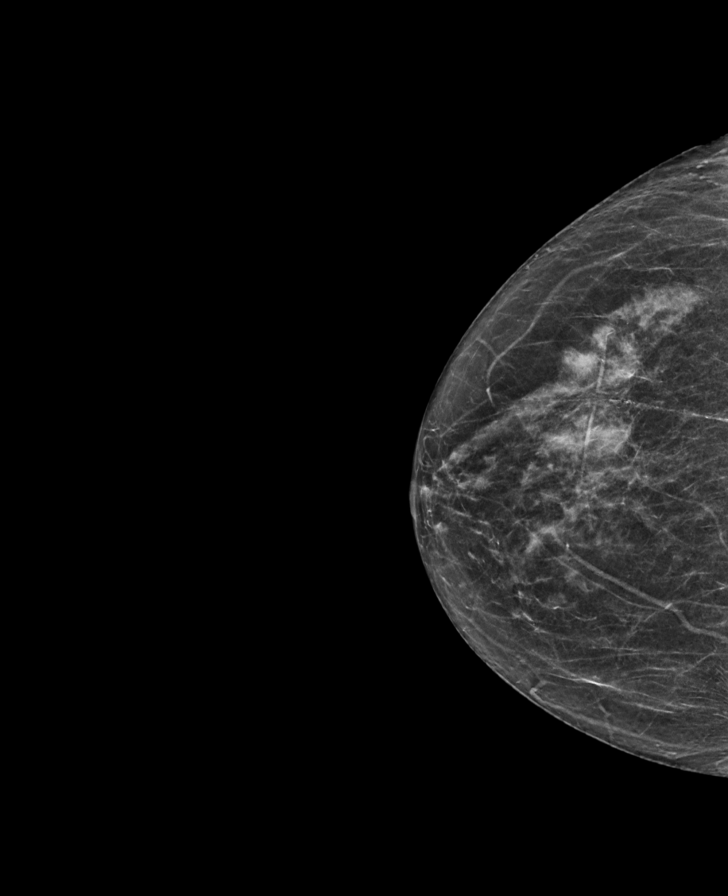

[L CC synth-2D]
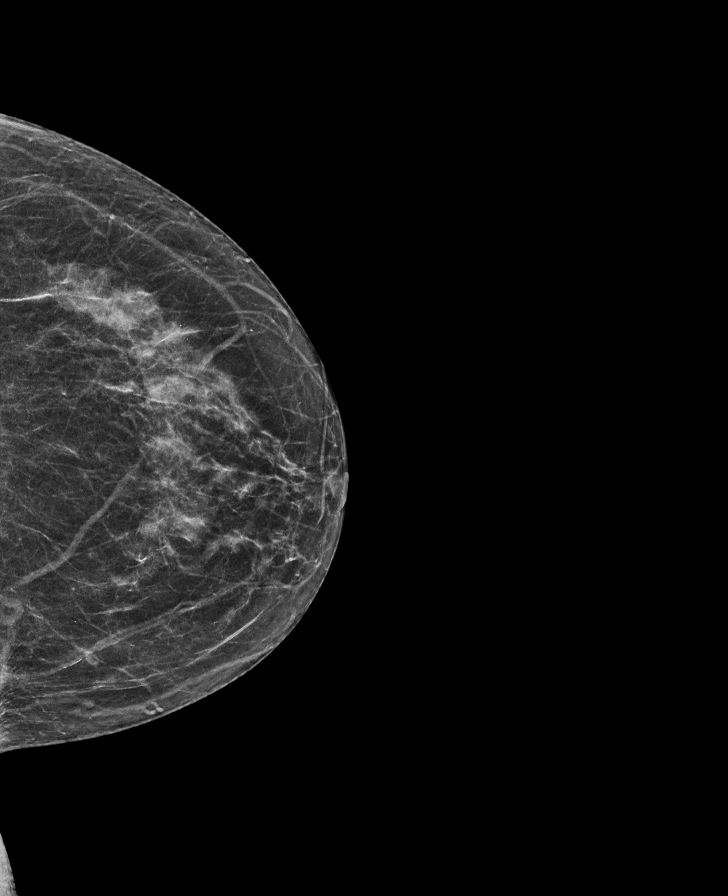

[R MLO synth-2D]
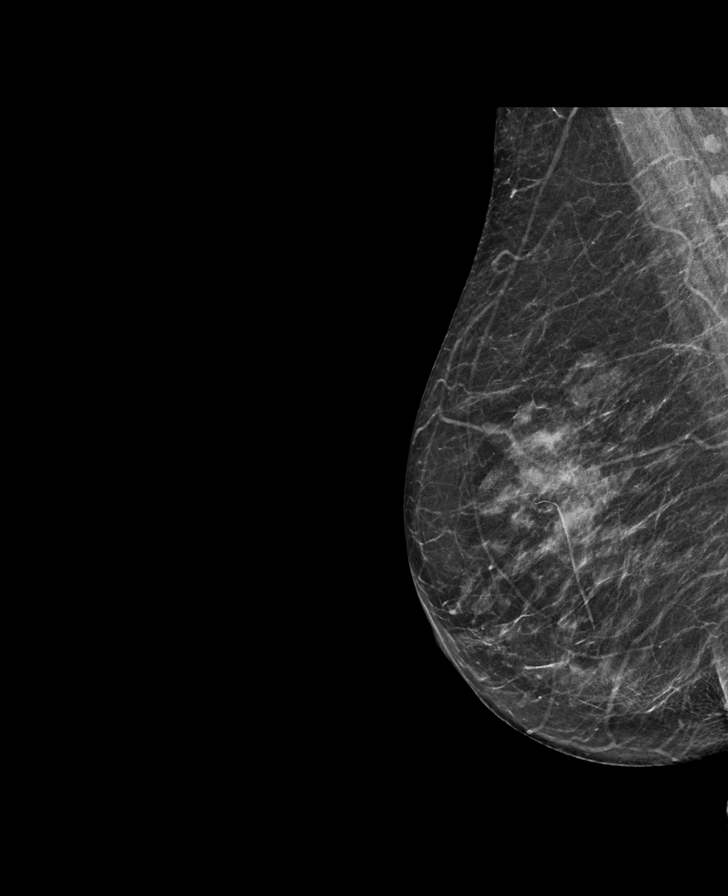

[L MLO synth-2D]
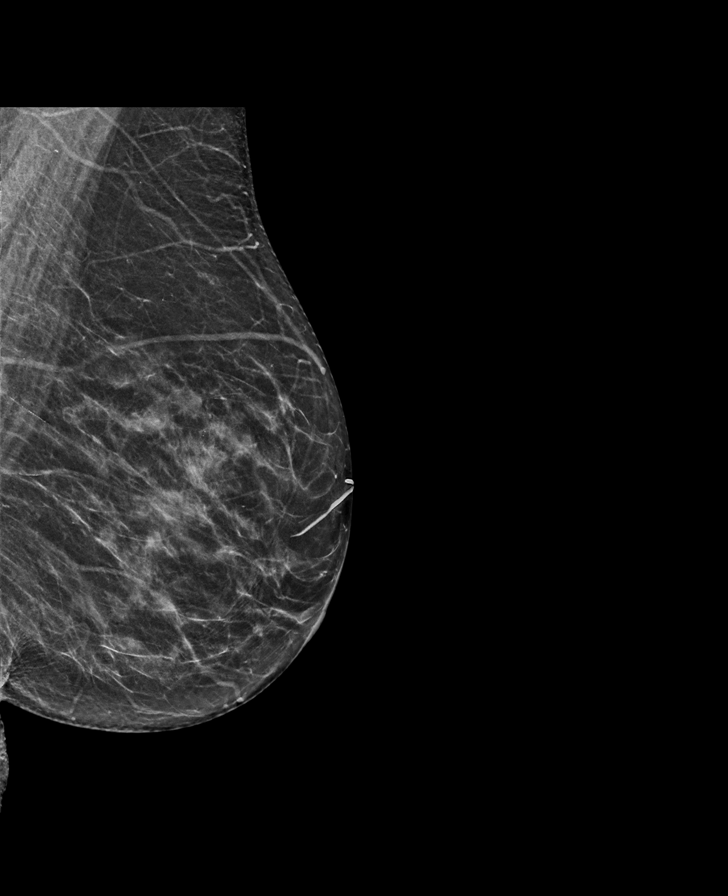

[L CC tomo · tomo slice 30/59.0]
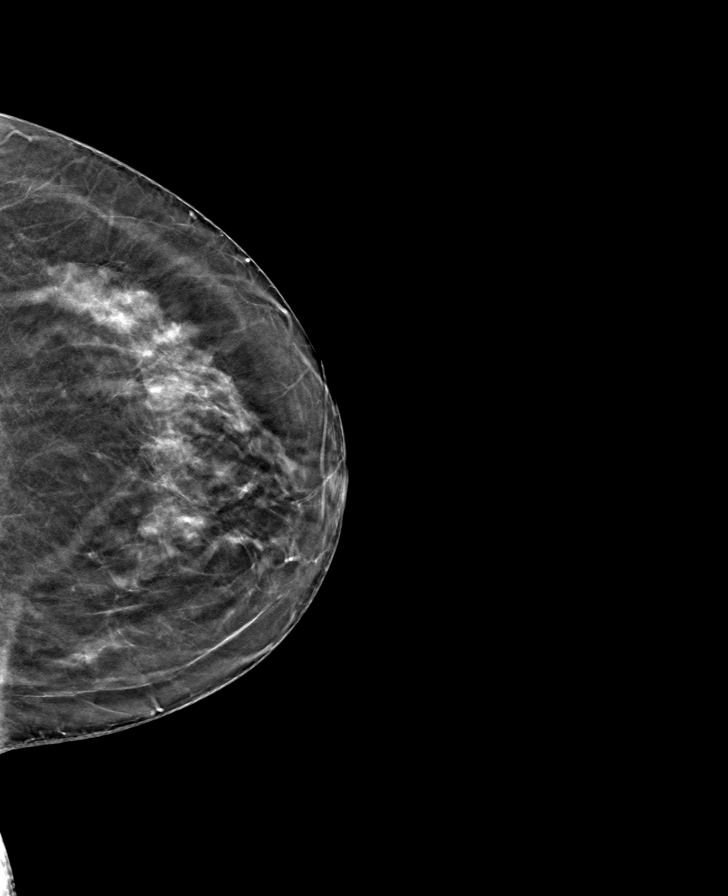

[R CC tomo · tomo slice 29/57.0]
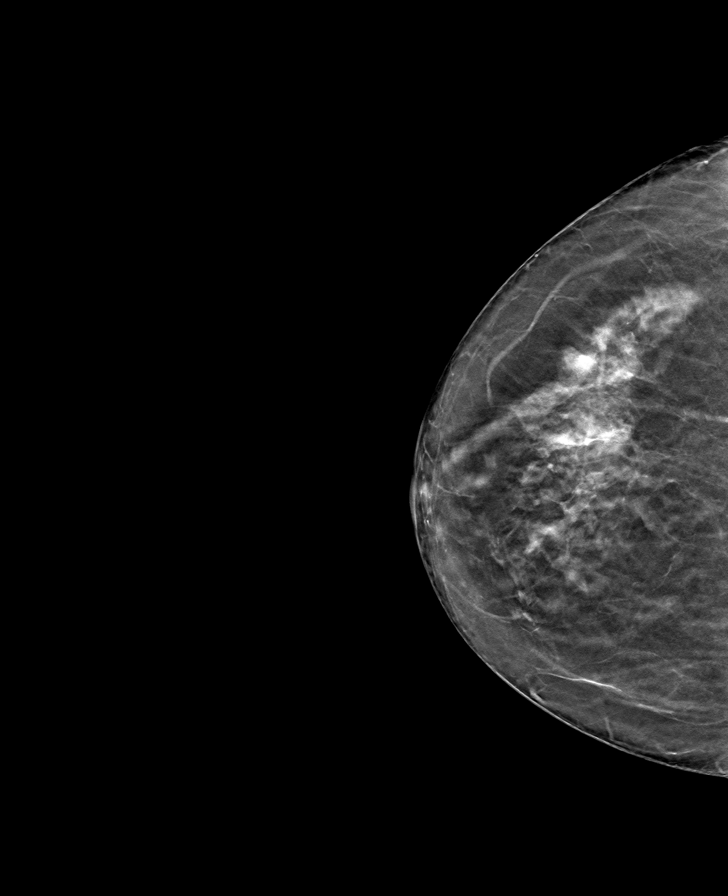

[R MLO tomo · tomo slice 30/59.0]
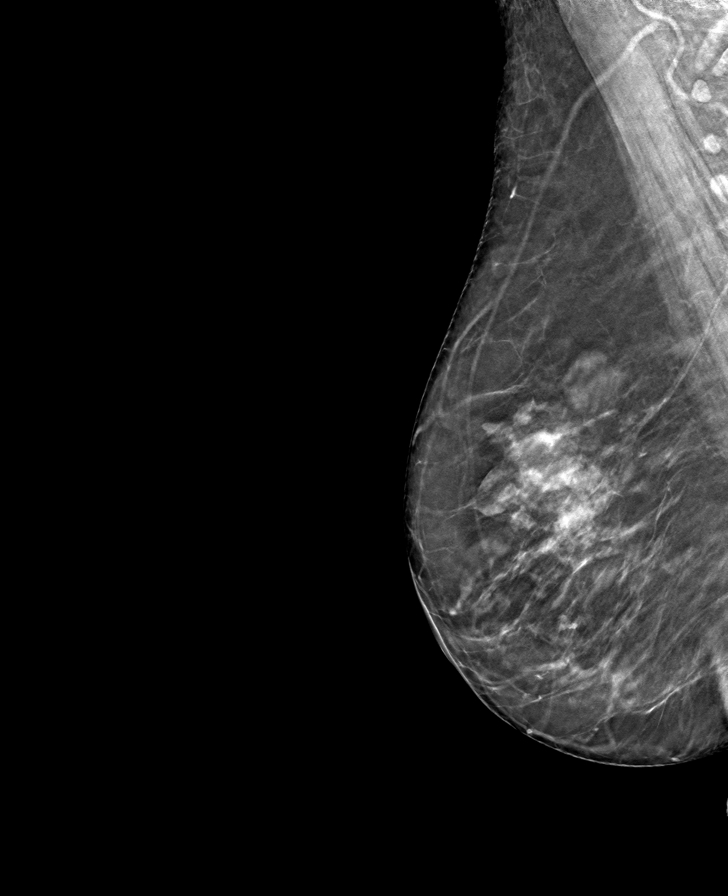

[L MLO tomo · tomo slice 31/61.0]
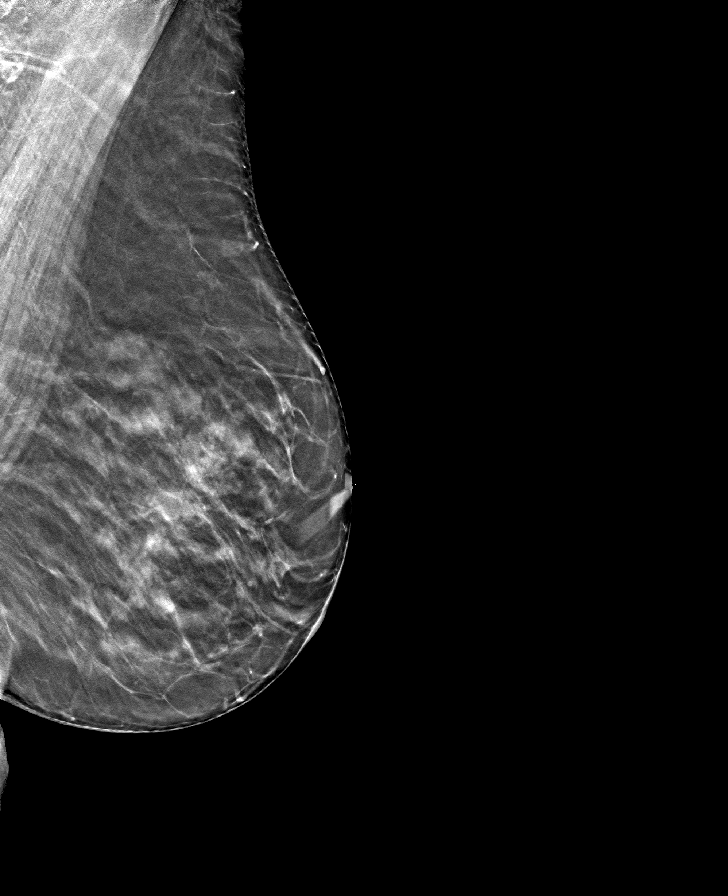

[8 of 24 positions shown; findings below may reference images not displayed]

ACR Breast Density Category c: The breast tissue is heterogeneously
dense, which may obscure small masses.
FINDINGS: There are no findings suspicious for malignancy. Images were
processed with CAD.
IMPRESSION: No mammographic evidence of malignancy. A result letter of this
screening mammogram will be mailed directly to the patient.

RECOMMENDATION:
Screening mammogram in one year. (Code:[id number])

BI-RADS CATEGORY  1: Negative.

## 2020-01-18 ENCOUNTER — Telehealth: Payer: Self-pay | Admitting: Cardiology

## 2020-01-18 DIAGNOSIS — M7989 Other specified soft tissue disorders: Secondary | ICD-10-CM

## 2020-01-18 NOTE — Telephone Encounter (Signed)
Please order a venous Doppler to rule out DVT.

## 2020-01-18 NOTE — Telephone Encounter (Signed)
Pt c/o swelling: STAT is pt has developed SOB within 24 hours  1) How much weight have you gained and in what time span? No   2) If swelling, where is the swelling located? Feet and ankles both sides  3) Are you currently taking a fluid pill? yes   4) Are you currently SOB? No   5) Do you have a log of your daily weights (if so, list)? 160lbs everyday   6) Have you gained 3 pounds in a day or 5 pounds in a week? No Was recently put on lasix for 4 days but the swelling hasn't gone down.  She wants to know if she wants to know if should continue taking them or what she should do.   7) Have you traveled recently?

## 2020-01-18 NOTE — Telephone Encounter (Addendum)
Pt called to report that since she was seen 01/06/20 and she was told to take lasix 40 mg for 4 days for bilateral ankle edema she has not hade any improvement... the edema goes up both her legs to her mid calf... no color change, no pain, and she does not feel any temp changes such as cold or warm to touch.   She took another lasix 01/17/20 but not much improvement.   She has been watching her sodium intake and has been elevating her legs when sitting. She denies any added Dyspnea.   Pt had BMET 01/12/20 and next OV is 03/09/20.   From her 01/06/20 note:   Swelling: She has increased lower extremity swelling since surgery.  She has been on anticoagulation and I do not strongly suspect a DVT.  I am going to start with 40 mg of Lasix for 4 days only.  She has had some renal insufficiency so I will check a basic metabolic profile in 1 week.  If she is not improved I probably will do Dopplers and she will call me.  Will forward to Dr. Percival Spanish for review.

## 2020-01-19 NOTE — Telephone Encounter (Signed)
No answer

## 2020-01-19 NOTE — Telephone Encounter (Signed)
Attempted to call patient - hung up after several rings as no one answered and no VM picked up.  Will try again later.

## 2020-01-19 NOTE — Telephone Encounter (Signed)
Rhonda [name] is returning [place] call.

## 2020-01-19 NOTE — Telephone Encounter (Signed)
Spoke to patient, aware of recommendations and verbalized understanding.  Order placed and sent to scheduler to arrange.

## 2020-01-22 ENCOUNTER — Other Ambulatory Visit: Payer: Self-pay

## 2020-01-22 ENCOUNTER — Ambulatory Visit (HOSPITAL_COMMUNITY)
Admission: RE | Admit: 2020-01-22 | Discharge: 2020-01-22 | Disposition: A | Discharge status: Home / Self Care | Payer: PPO | Source: Ambulatory Visit | Attending: Cardiovascular Disease | Admitting: Cardiovascular Disease

## 2020-01-22 DIAGNOSIS — M7989 Other specified soft tissue disorders: Secondary | ICD-10-CM | POA: Insufficient documentation

## 2020-01-25 ENCOUNTER — Other Ambulatory Visit: Payer: Self-pay | Admitting: Family Medicine

## 2020-01-25 DIAGNOSIS — I4811 Longstanding persistent atrial fibrillation: Secondary | ICD-10-CM

## 2020-01-27 ENCOUNTER — Telehealth: Payer: Self-pay | Admitting: *Deleted

## 2020-01-27 NOTE — Telephone Encounter (Signed)
Agree no more Lasix.  Has she seen her primary since she saw me since I am not finding the cause?

## 2020-01-27 NOTE — Telephone Encounter (Signed)
Advised of results Patient stated she was still having bilateral LE swelling and almost looks bruised colored She did take the Lasix for 4 days as advised, no improvement Wearing support stockings, seems to help  Patient asking is she should continue the Lasix Advised to continue with the support stockings but not Lasix and will forward to Dr Percival Spanish for review

## 2020-01-27 NOTE — Telephone Encounter (Signed)
Advised patient, verbalized understanding  

## 2020-01-27 NOTE — Telephone Encounter (Signed)
-----   Message from [name], MD sent at 01/23/2020 11:02 AM EDT ----- No evidence of blood clot.  There is evidence of venous insufficiency in the right.  This is managed conservatively.  No change in therapy. Call Ms. Losano with the results and send results to Dettinger, Fransisca Kaufmann, MD

## 2020-02-17 ENCOUNTER — Ambulatory Visit (INDEPENDENT_AMBULATORY_CARE_PROVIDER_SITE_OTHER): Payer: PPO | Admitting: *Deleted

## 2020-02-17 DIAGNOSIS — Z0000 Encounter for general adult medical examination without abnormal findings: Secondary | ICD-10-CM | POA: Diagnosis not present

## 2020-02-17 NOTE — Progress Notes (Signed)
Marland Kitchen...     [place] VISIT  02/17/2020  Telephone Visit Disclaimer This Medicare AWV was conducted by telephone due to national recommendations for restrictions regarding the COVID-19 Pandemic (e.g. social distancing).  I verified, using two identifiers, that I am speaking with Rhonda [name] or their authorized healthcare agent. I discussed the limitations, risks, security, and privacy concerns of performing an evaluation and management service by telephone and the potential availability of an in-person appointment in the future. The patient expressed understanding and agreed to proceed.  Location of Patient: home Location of Provider (nurse): office  Subjective:    Rhonda [name] is a 86 y.o. female patient of Dettinger, Fransisca Kaufmann, MD who had a Medicare Annual Wellness Visit today via telephone. Amelie is Retired and lives alone. she has one children. she reports that she is socially active and does interact with friends/family regularly. she is minimally physically active and enjoys reading.  Patient Care Team: Dettinger, Fransisca Kaufmann, MD as PCP - General (Family Medicine) [name], MD as PCP - Cardiology (Cardiology) [name], MD as Consulting Physician (Cardiology) Latanya Maudlin, MD as Consulting Physician (Orthopedic Surgery) Ilean China, RN as Registered Nurse  Advanced Directives 02/17/2020 10/12/2019 09/28/2019 02/16/2019 10/18/2018 05/20/2018 05/13/2018  Does Patient Have a Medical Advance Directive? Yes No No Yes No Yes No  Type of Advance Directive Healthcare Power of [place] -  Does patient want to make changes to medical advance directive? No - Patient declined No - Patient declined No - Patient declined No - Patient declined - No - Patient declined -  Copy of [place] in Chart? No - copy requested - - No - copy requested - No - copy requested -  Would  patient like information on creating a medical advance directive? - No - Patient declined - - No - Patient declined No - Patient declined No - Patient declined    Hospital Utilization Over the Past 12 Months: # of hospitalizations or ER visits: 1 # of surgeries: 1  Review of Systems    Patient reports that her overall health is worse compared to last year.  History obtained from chart review and the patient  Patient Reported Readings (BP, Pulse, CBG, Weight, etc) none  Pain Assessment Pain : No/denies pain     Current Medications & Allergies (verified) Allergies as of 02/17/2020   No Known Allergies     Medication List       Accurate as of February 17, 2020  8:57 AM. If you have any questions, ask your nurse or doctor.        STOP taking these medications   furosemide 40 MG tablet Commonly known as: LASIX   methocarbamol 500 MG tablet Commonly known as: ROBAXIN   oxyCODONE 5 MG immediate release tablet Commonly known as: Oxy IR/ROXICODONE   traMADol 50 MG tablet Commonly known as: ULTRAM     TAKE these medications   acetaminophen 650 MG CR tablet Commonly known as: TYLENOL Take 1,300 mg by mouth every 8 (eight) hours as needed for pain.   cholecalciferol 25 MCG (1000 UNIT) tablet Commonly known as: VITAMIN D3 Take 2,000 Units by mouth 2 (two) times daily.   diltiazem 360 MG 24 hr capsule Commonly known as: CARDIZEM CD Take 1 capsule (360 mg total) by mouth daily.   Eliquis 5 MG Tabs tablet Generic drug: apixaban TAKE ONE TABLET BY MOUTH TWICE  DAILY What changed: how much to take   flecainide 50 MG tablet Commonly known as: TAMBOCOR TAKE ONE TABLET BY MOUTH TWICE DAILY   zolpidem 5 MG tablet Commonly known as: AMBIEN Take 1 tablet (5 mg total) by mouth at bedtime as needed for sleep.       History (reviewed): Past Medical History:  Diagnosis Date   Arthritis    Atrial fibrillation ([place]) 05/2015   Dysrhythmia    afib   Hypertension      Osteopenia    Syncope 04/2014   Past Surgical History:  Procedure Laterality Date   BREAST EXCISIONAL BIOPSY Left    BREAST EXCISIONAL BIOPSY Right    COLONOSCOPY  2011   KNEE ARTHROCENTESIS Bilateral    KNEE ARTHROSCOPY     bil   POLYPECTOMY  11-01-2009   ROTATOR CUFF REPAIR     SHOULDER SURGERY Left    TOTAL KNEE ARTHROPLASTY Left    TOTAL KNEE ARTHROPLASTY Right 10/12/2019   Procedure: TOTAL KNEE ARTHROPLASTY;  Surgeon: Gaynelle Arabian, MD;  Location: [facility];  Service: Orthopedics;  Laterality: Right;  71min   VAGINAL HYSTERECTOMY     twice - vaginal and then oopherectomy   Family History  Problem Relation Age of Onset   Heart failure Mother    Hypertension Father    Arthritis Father    Hypertension Sister    Arthritis Sister    Cancer Brother    Bladder Cancer Brother    Hypertension Brother    Diabetes Brother    Social History   Socioeconomic History   Marital status: Widowed    Spouse name: Not on file   Number of children: 1   Years of education: 12   Highest education level: High school graduate  Occupational History   Occupation: retired  Tobacco Use   Smoking status: Never Smoker   Smokeless tobacco: Never Used  Scientific laboratory technician Use: Never used  Substance and Sexual Activity   Alcohol use: No    Alcohol/week: 0.0 standard drinks   Drug use: No   Sexual activity: Not Currently  Other Topics Concern   Not on file  Social History Narrative        Social Determinants of Health   Financial Resource Strain: Low Risk    Difficulty of Paying Living Expenses: Not hard at all  Food Insecurity: No Food Insecurity   Worried About Charity fundraiser in the Last Year: Never true   [place] in the Last Year: Never true  Transportation Needs: No Transportation Needs   Lack of Transportation (Medical): No   Lack of Transportation (Non-Medical): No  Physical Activity: Insufficiently Active   Days of  Exercise per Week: 4 days   Minutes of Exercise per Session: 30 min  Stress: No Stress Concern Present   Feeling of Stress : Not at all  Social Connections: Moderately Integrated   Frequency of Communication with Friends and Family: More than three times a week   Frequency of Social Gatherings with Friends and Family: More than three times a week   Attends Religious Services: More than 4 times per year   Active Member of [employer] or Organizations: Yes   Attends Archivist Meetings: More than 4 times per year   Marital Status: Widowed    Activities of Daily Living In your present state of health, do you have any difficulty performing the following activities: 02/17/2020 10/12/2019  Hearing? N -  Vision?  N -  Difficulty concentrating or making decisions? N -  Walking or climbing stairs? Y -  Comment just had knee surgery -  Dressing or bathing? N -  Doing errands, shopping? N N  Preparing Food and eating ? N -  Using the Toilet? N -  In the past six months, have you accidently leaked urine? N -  Do you have problems with loss of bowel control? N -  Managing your Medications? N -  Managing your Finances? N -  Housekeeping or managing your Housekeeping? N -  Some recent data might be hidden    Patient Education/ Literacy How often do you need to have someone help you when you read instructions, pamphlets, or other written materials from your doctor or pharmacy?: 1 - Never What is the last grade level you completed in school?: 12  Exercise Current Exercise Habits: Home exercise routine, Type of exercise: stretching;walking, Time (Minutes): 30, Frequency (Times/Week): 4, Weekly Exercise (Minutes/Week): 120, Intensity: Mild, Exercise limited by: None identified  Diet Patient reports consuming 3 meals a day and 2 snack(s) a day Patient reports that her primary diet is: Regular Patient reports that she does have regular access to food.   Depression Screen PHQ 2/9  Scores 02/17/2020 08/28/2019 02/16/2019 12/18/2018 09/16/2018 06/19/2018 05/13/2018  PHQ - 2 Score 0 0 0 0 0 0 0     Fall Risk Fall Risk  02/17/2020 08/28/2019 02/16/2019 12/18/2018 09/16/2018  Falls in the past year? 1 0 0 0 0  Number falls in past yr: 0 - 0 - -  Injury with Fall? 0 - 0 - -  Risk for fall due to : History of fall(s) - - - -  Follow up Falls prevention discussed - Falls prevention discussed - -  Comment - - Get rid of all throw rugs in the house, adequate lighting in the walkways and grab bars in the bathroom - -     Objective:  Rhonda [name] seemed alert and oriented and she participated appropriately during our telephone visit.  Blood Pressure Weight BMI  BP Readings from Last 3 Encounters:  01/06/20 130/70  10/13/19 113/62  10/05/19 138/74   Wt Readings from Last 3 Encounters:  01/06/20 161 lb (73 kg)  10/12/19 171 lb 2 oz (77.6 kg)  10/05/19 171 lb 2 oz (77.6 kg)   BMI Readings from Last 1 Encounters:  01/06/20 26.79 kg/m    *Unable to obtain current vital signs, weight, and BMI due to telephone visit type  Hearing/Vision   Yumi did not seem to have difficulty with hearing/understanding during the telephone conversation  Reports that she has had a formal eye exam by an eye care professional within the past year  Reports that she has not had a formal hearing evaluation within the past year *Unable to fully assess hearing and vision during telephone visit type  Cognitive Function: 6CIT Screen 02/17/2020 02/16/2019  What Year? 0 points 0 points  What month? 0 points 0 points  What time? 0 points 0 points  Count back from 20 0 points 0 points  Months in reverse 0 points 0 points  Repeat phrase 0 points 2 points  Total Score 0 2   (Normal:0-7, Significant for Dysfunction: >8)  Normal Cognitive Function Screening: Yes   Immunization & Health Maintenance Record Immunization History  Administered Date(s) Administered   Fluad Quad(high Dose 65+)  02/27/2019   Influenza, High Dose Seasonal PF 03/19/2016, 02/27/2017, 02/11/2018   Influenza, Seasonal, Injecte,  Preservative Fre 03/23/2015   Influenza,inj,Quad PF,6+ Mos 03/23/2015   Influenza,inj,quad, With Preservative 01/21/2017   Influenza,trivalent, recombinat, inj, PF 03/11/2014   Influenza-Unspecified 02/13/2006   Moderna SARS-COVID-2 Vaccination 05/27/2019, 06/25/2019   Pneumococcal Conjugate-13 09/24/2016   Pneumococcal Polysaccharide-23 11/18/2017    Health Maintenance  Topic Date Due   TETANUS/TDAP  Never done   DEXA SCAN  05/14/2018   INFLUENZA VACCINE  11/22/2019   COVID-19 Vaccine  Completed   PNA vac Low Risk Adult  Completed       Assessment  This is a routine wellness examination for [employer].  Health Maintenance: Due or Overdue Health Maintenance Due  Topic Date Due   TETANUS/TDAP  Never done   DEXA SCAN  05/14/2018   INFLUENZA VACCINE  11/22/2019    Rhonda [name] does not need a referral for Community Assistance: Care Management:   no Social Work:    no Prescription Assistance:  no Nutrition/Diabetes Education:  no   Plan:  Personalized Goals Goals Addressed              This Visit's Progress     Patient Stated (pt-stated)        Get back in exercise routine since having knee surgery      Personalized Health Maintenance & Screening Recommendations  Influenza vaccine Td vaccine Bone densitometry screening  Lung Cancer Screening Recommended: no (Low Dose CT Chest recommended if Age 87-80 years, 30 pack-year currently smoking OR have quit w/in past 15 years) Hepatitis C Screening recommended: no HIV Screening recommended: no  Advanced Directives: Written information was not prepared per patient's request.  Referrals & Orders No orders of the defined types were placed in this encounter.   Follow-up Plan  Follow-up with Dettinger, Fransisca Kaufmann, MD as planned on 02/29/20.  Flu shot to be given at  11/8 appt.  Discuss Dexa at 11/8 appt.  Pt declines shingles vaccine and tdap.  Pt had knee surgery 4 months ago, she is just getting back to her normal activities.  Pt is independent with all ADL's and driving.  Pt has a POA, her son. Copy requested for our records.  Pt voices no healthcare concerns at this time.  AVS printed and mailed to patient,    I have personally reviewed and noted the following in the patients chart:    Medical and social history  Use of alcohol, tobacco or illicit drugs   Current medications and supplements  Functional ability and status  Nutritional status  Physical activity  Advanced directives  List of other physicians  Hospitalizations, surgeries, and ER visits in previous 12 months  Vitals  Screenings to include cognitive, depression, and falls  Referrals and appointments  In addition, I have reviewed and discussed with Rhonda [name] certain preventive protocols, quality metrics, and best practice recommendations. A written personalized care plan for preventive services as well as general preventive health recommendations is available and can be mailed to the patient at her request.      Rana Snare, LPN  [id number]

## 2020-02-29 ENCOUNTER — Other Ambulatory Visit: Payer: Self-pay

## 2020-02-29 ENCOUNTER — Encounter: Payer: Self-pay | Admitting: Family Medicine

## 2020-02-29 ENCOUNTER — Ambulatory Visit (INDEPENDENT_AMBULATORY_CARE_PROVIDER_SITE_OTHER): Payer: PPO

## 2020-02-29 ENCOUNTER — Ambulatory Visit (INDEPENDENT_AMBULATORY_CARE_PROVIDER_SITE_OTHER): Payer: PPO | Admitting: Family Medicine

## 2020-02-29 VITALS — BP 124/66 | HR 72 | Temp 77.0°F | Ht 65.0 in | Wt 160.0 lb

## 2020-02-29 DIAGNOSIS — I4811 Longstanding persistent atrial fibrillation: Secondary | ICD-10-CM

## 2020-02-29 DIAGNOSIS — I10 Essential (primary) hypertension: Secondary | ICD-10-CM | POA: Diagnosis not present

## 2020-02-29 DIAGNOSIS — Z780 Asymptomatic menopausal state: Secondary | ICD-10-CM

## 2020-02-29 DIAGNOSIS — F5101 Primary insomnia: Secondary | ICD-10-CM

## 2020-02-29 MED ORDER — ZOLPIDEM TARTRATE 5 MG PO TABS: 5.0000 mg | ORAL_TABLET | Freq: Every evening | ORAL | 5 refills | Status: DC | PRN

## 2020-02-29 MED ORDER — FLECAINIDE ACETATE 50 MG PO TABS: 50.0000 mg | ORAL_TABLET | Freq: Two times a day (BID) | ORAL | 3 refills | Status: DC

## 2020-02-29 NOTE — Progress Notes (Signed)
BP 124/66   Pulse 72   Temp (!) 77 F (25 C)   Ht 5\' 5"  (1.651 m)   Wt 160 lb (72.6 kg)   SpO2 99%   BMI 26.63 kg/m    Subjective:   Patient ID: Rhonda [name], female    DOB: 10-06-1932, 86 y.o.   MRN: [id number]  HPI: Rhonda [name] is a 86 y.o. female presenting on 02/29/2020 for Medical Management of Chronic Issues, Hypertension, and Atrial Fibrillation   HPI Hypertension and A. fib Patient is currently on diltiazem and flecainide, and their blood pressure today is 124/66. Patient denies any lightheadedness or dizziness. Patient denies headaches, blurred vision, chest pains, shortness of breath, or weakness. Denies any side effects from medication and is content with current medication.   Insomnia Current rx-Ambien 5 mg nightly as needed # meds rx-30 Effectiveness of current meds-works okay, since her surgery has not been working as well Adverse reactions form meds-none currently  Pill count performed-No Last drug screen -N/A ( high risk q56m, moderate risk q65m, low risk yearly ) Urine drug screen today- Yes Was the [place] reviewed-yes  If yes were their any concerning findings? -Had refilled a little bit early this last month  No flowsheet data found.   Controlled substance contract signed on: Today  A. Fib Patient has paroxysmal A. fib and sees cardiology for this, currently on Eliquis and diltiazem.  Relevant past medical, surgical, family and social history reviewed and updated as indicated. Interim medical history since our last visit reviewed. Allergies and medications reviewed and updated.  Review of Systems  Constitutional: Negative for chills and fever.  Eyes: Negative for visual disturbance.  Respiratory: Negative for chest tightness and shortness of breath.   Cardiovascular: Negative for chest pain and leg swelling.  Genitourinary: Negative for difficulty urinating and dysuria.  Musculoskeletal: Negative for back pain and gait problem.   Skin: Negative for rash.  Neurological: Negative for light-headedness and headaches.  Psychiatric/Behavioral: Positive for sleep disturbance. Negative for agitation, behavioral problems, self-injury and suicidal ideas. The patient is not nervous/anxious.   All other systems reviewed and are negative.   Per HPI unless specifically indicated above   Allergies as of 02/29/2020   No Known Allergies     Medication List       Accurate as of February 29, 2020  9:33 AM. If you have any questions, ask your nurse or doctor.        acetaminophen 650 MG CR tablet Commonly known as: TYLENOL Take 1,300 mg by mouth every 8 (eight) hours as needed for pain.   cholecalciferol 25 MCG (1000 UNIT) tablet Commonly known as: VITAMIN D3 Take 2,000 Units by mouth 2 (two) times daily.   diltiazem 360 MG 24 hr capsule Commonly known as: CARDIZEM CD Take 1 capsule (360 mg total) by mouth daily.   Eliquis 5 MG Tabs tablet Generic drug: apixaban TAKE ONE TABLET BY MOUTH TWICE DAILY What changed: how much to take   flecainide 50 MG tablet Commonly known as: TAMBOCOR TAKE ONE TABLET BY MOUTH TWICE DAILY   zolpidem 5 MG tablet Commonly known as: AMBIEN Take 1 tablet (5 mg total) by mouth at bedtime as needed for sleep.        Objective:   BP 124/66   Pulse 72   Temp (!) 77 F (25 C)   Ht 5\' 5"  (1.651 m)   Wt 160 lb (72.6 kg)   SpO2 99%   BMI 26.63  kg/m   Wt Readings from Last 3 Encounters:  02/29/20 160 lb (72.6 kg)  01/06/20 161 lb (73 kg)  10/12/19 171 lb 2 oz (77.6 kg)    Physical Exam Vitals and nursing note reviewed.  Constitutional:      General: She is not in acute distress.    Appearance: She is well-developed. She is not diaphoretic.  Eyes:     Conjunctiva/sclera: Conjunctivae normal.  Cardiovascular:     Rate and Rhythm: Normal rate and regular rhythm.     Heart sounds: Normal heart sounds. No murmur heard.   Pulmonary:     Effort: Pulmonary effort is normal. No  respiratory distress.     Breath sounds: Normal breath sounds. No wheezing.  Skin:    General: Skin is warm and dry.     Findings: No rash.  Neurological:     Mental Status: She is alert and oriented to person, place, and time.     Coordination: Coordination normal.  Psychiatric:        Mood and Affect: Mood is not anxious or depressed.        Behavior: Behavior normal.        Thought Content: Thought content does not include suicidal ideation. Thought content does not include suicidal plan.       Assessment & Plan:   Problem List Items Addressed This Visit      Cardiovascular and Mediastinum   Atrial fibrillation (HCC)   Relevant Medications   flecainide (TAMBOCOR) 50 MG tablet   Essential hypertension   Relevant Medications   flecainide (TAMBOCOR) 50 MG tablet     Other   Insomnia   Relevant Medications   zolpidem (AMBIEN) 5 MG tablet   Other Relevant Orders   ToxASSURE Select 13 (MW), Urine    Other Visit Diagnoses    Postmenopausal    -  Primary   Relevant Orders   DG [facility] DEXA      Gave permission for patient to take 1-1/2 tablets of Ambien for the next week and then go back to 1 tablet, concerned because of age that the 10 mg would be too high for her.  She says she is having some trouble sleeping but we will see if this resets her and gets her back on track. Follow up plan: Return in about 3 months (around 05/31/2020), or if symptoms worsen or fail to improve, for Hypertension and insomnia.  Counseling provided for all of the vaccine components No orders of the defined types were placed in this encounter.   Rhonda Pina, MD [facility] 02/29/2020, 9:33 AM

## 2020-03-01 DIAGNOSIS — M810 Age-related osteoporosis without current pathological fracture: Secondary | ICD-10-CM | POA: Diagnosis not present

## 2020-03-03 LAB — TOXASSURE SELECT 13 (MW), URINE

## 2020-03-07 DIAGNOSIS — M7989 Other specified soft tissue disorders: Secondary | ICD-10-CM | POA: Insufficient documentation

## 2020-03-07 NOTE — Progress Notes (Signed)
Cardiology Office Note   Date:  03/09/2020   ID:  Rhonda [name], DOB 31-Jan-1933, MRN [id number]  PCP:  Dettinger, Fransisca Kaufmann, MD  Cardiologist:   [name], MD   Chief Complaint  Patient presents with  . Leg Swelling      History of Present Illness: Rhonda [name] is a 86 y.o. female who with atrial fib.  She was in the hospital in Jan with this.  She was treated with flecainide, Eliquis and Diltiazem.   In Feb she had a POET ([employer] Exercise Treadmill). In late June 2020 she was in the ED with SVT. She was treated with adenosine.   She has had lower extremity swelling.  She had no evidence of a DVT on ultrasound that I ordered after the last visit.  I treated her with Lasix.       However, she did not get much improvement with Lasix.  There was no other clear etiology and she is now having some improvement with compression stockings.  She denies any new cardiovascular symptoms. The patient denies any new symptoms such as chest discomfort, neck or arm discomfort. There has been no new shortness of breath, PND or orthopnea. There have been no reported palpitations, presyncope or syncope.   Past Medical History:  Diagnosis Date  . Arthritis   . Atrial fibrillation ([place]) 05/2015  . Dysrhythmia    afib  . Hypertension   . Osteopenia   . Syncope 04/2014    Past Surgical History:  Procedure Laterality Date  . BREAST EXCISIONAL BIOPSY Left   . BREAST EXCISIONAL BIOPSY Right   . COLONOSCOPY  2011  . KNEE ARTHROCENTESIS Bilateral   . KNEE ARTHROSCOPY     bil  . POLYPECTOMY  11-01-2009  . ROTATOR CUFF REPAIR    . SHOULDER SURGERY Left   . TOTAL KNEE ARTHROPLASTY Left   . TOTAL KNEE ARTHROPLASTY Right 10/12/2019   Procedure: TOTAL KNEE ARTHROPLASTY;  Surgeon: Gaynelle Arabian, MD;  Location: [facility];  Service: Orthopedics;  Laterality: Right;  87min  . VAGINAL HYSTERECTOMY     twice - vaginal and then oopherectomy     Current Outpatient Medications  Medication  Sig Dispense Refill  . acetaminophen (TYLENOL) 650 MG CR tablet Take 1,300 mg by mouth every 8 (eight) hours as needed for pain.     . cholecalciferol (VITAMIN D3) 25 MCG (1000 UT) tablet Take 2,000 Units by mouth 2 (two) times daily.    Marland Kitchen diltiazem (CARDIZEM CD) 360 MG 24 hr capsule Take 1 capsule (360 mg total) by mouth daily. 90 capsule 3  . ELIQUIS 5 MG TABS tablet TAKE ONE TABLET BY MOUTH TWICE DAILY (Patient taking differently: Take 5 mg by mouth 2 (two) times daily. ) 180 tablet 3  . flecainide (TAMBOCOR) 50 MG tablet Take 1 tablet (50 mg total) by mouth 2 (two) times daily. 180 tablet 3  . zolpidem (AMBIEN) 5 MG tablet Take 1 tablet (5 mg total) by mouth at bedtime as needed for sleep. 30 tablet 5   No current facility-administered medications for this visit.    Allergies:   Patient has no known allergies.    ROS:  Please see the history of present illness.   Otherwise, review of systems are positive for none.   All other systems are reviewed and negative.    PHYSICAL EXAM: VS:  BP 118/68   Pulse 72   Ht 5\' 5"  (1.651 m)   Wt 162  lb (73.5 kg)   BMI 26.96 kg/m  , BMI Body mass index is 26.96 kg/m. GENERAL:  Well appearing NECK:  No jugular venous distention, waveform within normal limits, carotid upstroke brisk and symmetric, no bruits, no thyromegaly LUNGS:  Clear to auscultation bilaterally CHEST:  Unremarkable HEART:  PMI not displaced or sustained,S1 and S2 within normal limits, no S3, no S4, no clicks, no rubs, no murmurs ABD:  Flat, positive bowel sounds normal in frequency in pitch, no bruits, no rebound, no guarding, no midline pulsatile mass, no hepatomegaly, no splenomegaly EXT:  2 plus pulses throughout, mild bilateral leg edema, no cyanosis no clubbing   EKG:  EKG is ordered today. Sinus rhythm, rate 72, leftward axis, intervals within normal limits, no acute ST-T wave changes.  Recent Labs: 10/05/2019: ALT 12 10/13/2019: Hemoglobin 12.2; Platelets  259 01/12/2020: BUN 18; Creatinine, Ser 0.93; Potassium 4.7; Sodium 136    Lipid Panel    Component Value Date/Time   CHOL 179 08/28/2019 1109   TRIG 136 08/28/2019 1109   HDL 52 08/28/2019 1109   CHOLHDL 3.4 08/28/2019 1109   LDLCALC 103 (H) 08/28/2019 1109      Wt Readings from Last 3 Encounters:  03/09/20 162 lb (73.5 kg)  02/29/20 160 lb (72.6 kg)  01/06/20 161 lb (73 kg)      Other studies Reviewed: Additional studies/ records that were reviewed today include:   None. Review of the above records demonstrates:  NA   ASSESSMENT AND PLAN:  Persistent atrial fibrillation:    He is in sinus rhythm.  No change in therapy.  He tolerates anticoagulation.  Hypertension:    Blood pressure is controlled.  No change in therapy.   SVT:  She has had no symptomatic recurrence of this no change in therapy.   Swelling:   This is not particularly bothersome to her.  She did not have a DVT.  She is wearing compression stockings and trying to keep her feet up.  No change in therapy.   Current medicines are reviewed at length with the patient today.  The patient does not have concerns regarding medicines.  The following changes have been made: None  Labs/ tests ordered today include: none  Orders Placed This Encounter  Procedures  . EKG 12-Lead     Disposition:   FU with me in 12 months.    Signed, [name], MD  03/09/2020 10:47 AM    [place]

## 2020-03-09 ENCOUNTER — Encounter: Payer: Self-pay | Admitting: Cardiology

## 2020-03-09 ENCOUNTER — Ambulatory Visit: Payer: PPO | Admitting: Cardiology

## 2020-03-09 ENCOUNTER — Other Ambulatory Visit: Payer: Self-pay

## 2020-03-09 VITALS — BP 118/68 | HR 72 | Ht 65.0 in | Wt 162.0 lb

## 2020-03-09 DIAGNOSIS — I471 Supraventricular tachycardia: Secondary | ICD-10-CM | POA: Diagnosis not present

## 2020-03-09 DIAGNOSIS — M7989 Other specified soft tissue disorders: Secondary | ICD-10-CM

## 2020-03-09 DIAGNOSIS — I4819 Other persistent atrial fibrillation: Secondary | ICD-10-CM

## 2020-03-09 DIAGNOSIS — I10 Essential (primary) hypertension: Secondary | ICD-10-CM

## 2020-03-09 NOTE — Patient Instructions (Signed)
Medication Instructions:  The current medical regimen is effective;  continue present plan and medications.  *If you need a refill on your cardiac medications before your next appointment, please call your pharmacy*  Follow-Up: At [facility], you and your health needs are our priority.  As part of our continuing mission to provide you with exceptional heart care, we have created designated Provider Care Teams.  These Care Teams include your primary Cardiologist (physician) and Advanced Practice Providers (APPs -  Physician Assistants and Nurse Practitioners) who all work together to provide you with the care you need, when you need it.  We recommend signing up for the patient portal called "MyChart".  Sign up information is provided on this After Visit Summary.  MyChart is used to connect with patients for Virtual Visits (Telemedicine).  Patients are able to view lab/test results, encounter notes, upcoming appointments, etc.  Non-urgent messages can be sent to your provider as well.   To learn more about what you can do with MyChart, go to [email or web address].    Your next appointment:   12 month(s)  The format for your next appointment:   In Person  Provider:   James Hochrein, MD   Thank you for choosing [facility]!!     

## 2020-03-31 DIAGNOSIS — Z96651 Presence of right artificial knee joint: Secondary | ICD-10-CM | POA: Diagnosis not present

## 2020-04-27 ENCOUNTER — Other Ambulatory Visit: Payer: Self-pay | Admitting: Cardiology

## 2020-04-27 NOTE — Telephone Encounter (Signed)
Prescription refill request for Eliquis received. Indication:atrial fibrillation Last office visit:11/21 hochrein Scr:1.12  9/21 Age: 87 Weight:73.5 kg  Prescription refilled

## 2020-06-01 ENCOUNTER — Other Ambulatory Visit: Payer: Self-pay

## 2020-06-01 ENCOUNTER — Encounter: Payer: Self-pay | Admitting: Family Medicine

## 2020-06-01 ENCOUNTER — Ambulatory Visit (INDEPENDENT_AMBULATORY_CARE_PROVIDER_SITE_OTHER): Payer: PPO | Admitting: Family Medicine

## 2020-06-01 ENCOUNTER — Telehealth: Payer: Self-pay

## 2020-06-01 VITALS — BP 136/74 | HR 71 | Ht 65.0 in | Wt 147.0 lb

## 2020-06-01 DIAGNOSIS — F5101 Primary insomnia: Secondary | ICD-10-CM | POA: Diagnosis not present

## 2020-06-01 DIAGNOSIS — I4811 Longstanding persistent atrial fibrillation: Secondary | ICD-10-CM

## 2020-06-01 DIAGNOSIS — I10 Essential (primary) hypertension: Secondary | ICD-10-CM

## 2020-06-01 DIAGNOSIS — M810 Age-related osteoporosis without current pathological fracture: Secondary | ICD-10-CM | POA: Diagnosis not present

## 2020-06-01 MED ORDER — ALENDRONATE SODIUM 70 MG PO TABS: 70.0000 mg | ORAL_TABLET | ORAL | 3 refills | Status: DC

## 2020-06-01 MED ORDER — DILTIAZEM HCL ER COATED BEADS 360 MG PO CP24: 360.0000 mg | ORAL_CAPSULE | Freq: Every day | ORAL | 3 refills | Status: DC

## 2020-06-01 NOTE — Progress Notes (Signed)
BP 136/74   Pulse 71   Ht 5' 5"  (1.651 m)   Wt 147 lb (66.7 kg)   SpO2 98%   BMI 24.46 kg/m    Subjective:   Patient ID: Rhonda [name], female    DOB: 03-16-1933, 87 y.o.   MRN: [id number]  HPI: Rhonda [name] is a 87 y.o. female presenting on 06/01/2020 for Medical Management of Chronic Issues   HPI Insomnia Current rx-Ambien 5 mg nightly as needed, was given 6 months worth in November so should be good till May # meds rx-30 Effectiveness of current meds-works well Adverse reactions form meds-none  Pill count performed-No Last drug screen -03/04/2020 ( high risk q36m moderate risk q647mlow risk yearly ) Urine drug screen today- No [place]  If yes were their any concerning findings? -She did fill 1 prescription slightly early but otherwise has been consistent  No flowsheet data found.   Controlled substance contract signed on: 03/04/2020  Hypertension Patient is currently on diltiazem and flecainide, and their blood pressure today is 136/74. Patient denies any lightheadedness or dizziness. Patient denies headaches, blurred vision, chest pains, shortness of breath, or weakness. Denies any side effects from medication and is content with current medication.   A. Fib Diltiazem and flecainide and Eliquis and sees cardiology Dr. HoPercival SpanishOsteoporosis Patient's last bone density scan shows that she has osteoporosis, her bone density has worsened to a T score of -3.7, will try Fosamax and see how she does with that.  Repeat in a couple years.  Relevant past medical, surgical, family and social history reviewed and updated as indicated. Interim medical history since our last visit reviewed. Allergies and medications reviewed and updated.  Review of Systems  Constitutional: Negative for chills and fever.  Eyes: Negative for visual disturbance.  Respiratory: Negative for chest tightness and shortness of breath.   Cardiovascular: Positive for leg  swelling. Negative for chest pain.  Musculoskeletal: Negative for back pain and gait problem.  Skin: Negative for rash.  Neurological: Negative for light-headedness and headaches.  Psychiatric/Behavioral: Negative for agitation and behavioral problems.  All other systems reviewed and are negative.   Per HPI unless specifically indicated above   Allergies as of 06/01/2020   No Known Allergies     Medication List       Accurate as of June 01, 2020  8:42 AM. If you have any questions, ask your nurse or doctor.        acetaminophen 650 MG CR tablet Commonly known as: TYLENOL Take 1,300 mg by mouth every 8 (eight) hours as needed for pain.   cholecalciferol 25 MCG (1000 UNIT) tablet Commonly known as: VITAMIN D3 Take 2,000 Units by mouth 2 (two) times daily.   diltiazem 360 MG 24 hr capsule Commonly known as: CARDIZEM CD Take 1 capsule (360 mg total) by mouth daily.   Eliquis 5 MG Tabs tablet Generic drug: apixaban TAKE ONE TABLET BY MOUTH TWICE DAILY   flecainide 50 MG tablet Commonly known as: TAMBOCOR Take 1 tablet (50 mg total) by mouth 2 (two) times daily.   zolpidem 5 MG tablet Commonly known as: AMBIEN Take 1 tablet (5 mg total) by mouth at bedtime as needed for sleep.        Objective:   BP 136/74   Pulse 71   Ht 5' 5"  (1.651 m)   Wt 147 lb (66.7 kg)   SpO2 98%   BMI 24.46 kg/m   Wt  Readings from Last 3 Encounters:  06/01/20 147 lb (66.7 kg)  03/09/20 162 lb (73.5 kg)  02/29/20 160 lb (72.6 kg)    Physical Exam Vitals and nursing note reviewed.  Constitutional:      General: She is not in acute distress.    Appearance: She is well-developed and well-nourished. She is not diaphoretic.  Eyes:     Extraocular Movements: EOM normal.     Conjunctiva/sclera: Conjunctivae normal.  Cardiovascular:     Rate and Rhythm: Normal rate and regular rhythm.     Pulses: Intact distal pulses.     Heart sounds: Normal heart sounds. No murmur  heard.   Pulmonary:     Effort: Pulmonary effort is normal. No respiratory distress.     Breath sounds: Normal breath sounds. No wheezing.  Musculoskeletal:        General: No tenderness or edema. Normal range of motion.  Skin:    General: Skin is warm and dry.     Findings: No rash.  Neurological:     Mental Status: She is alert and oriented to person, place, and time.     Coordination: Coordination normal.  Psychiatric:        Mood and Affect: Mood and affect normal.        Behavior: Behavior normal.       Assessment & Plan:   Problem List Items Addressed This Visit      Cardiovascular and Mediastinum   Atrial fibrillation (HCC) - Primary   Relevant Medications   diltiazem (CARDIZEM CD) 360 MG 24 hr capsule   Other Relevant Orders   CBC with Differential/Platelet   Essential hypertension   Relevant Medications   diltiazem (CARDIZEM CD) 360 MG 24 hr capsule   Other Relevant Orders   CMP14+EGFR   Lipid panel     Musculoskeletal and Integument   Osteoporosis   Relevant Medications   alendronate (FOSAMAX) 70 MG tablet     Other   Insomnia      A. fib and hypertension and insomnia recheck, continue current medication.  Looks like she should have enough Ambien to get her through 3 more months, follow-up in 3 months. Follow up plan: Return in about 3 months (around 08/29/2020), or if symptoms worsen or fail to improve, for A. fib and hypertension.  Counseling provided for all of the vaccine components Orders Placed This Encounter  Procedures  . CBC with Differential/Platelet  . CMP14+EGFR  . Lipid panel    Caryl Pina, MD [facility] 06/01/2020, 8:42 AM

## 2020-06-01 NOTE — Telephone Encounter (Signed)
Per Dr. Warrick Parisian make patient aware that she should start taking Fosamax once weekly with a full glass of water and on a empty stomach. She should also repeat Dexa in two years.   Pt informed and understood.

## 2020-07-11 ENCOUNTER — Other Ambulatory Visit: Payer: Self-pay | Admitting: Family Medicine

## 2020-07-11 DIAGNOSIS — F5101 Primary insomnia: Secondary | ICD-10-CM

## 2020-08-10 ENCOUNTER — Other Ambulatory Visit: Payer: Self-pay | Admitting: Family Medicine

## 2020-08-10 ENCOUNTER — Telehealth: Payer: Self-pay

## 2020-08-10 DIAGNOSIS — F5101 Primary insomnia: Secondary | ICD-10-CM

## 2020-08-17 ENCOUNTER — Other Ambulatory Visit: Payer: Self-pay

## 2020-08-17 ENCOUNTER — Encounter: Payer: Self-pay | Admitting: Family Medicine

## 2020-08-17 ENCOUNTER — Ambulatory Visit (INDEPENDENT_AMBULATORY_CARE_PROVIDER_SITE_OTHER): Payer: PPO | Admitting: Family Medicine

## 2020-08-17 VITALS — BP 140/80 | HR 74 | Ht 65.0 in | Wt 147.0 lb

## 2020-08-17 DIAGNOSIS — I4811 Longstanding persistent atrial fibrillation: Secondary | ICD-10-CM

## 2020-08-17 DIAGNOSIS — F5101 Primary insomnia: Secondary | ICD-10-CM | POA: Diagnosis not present

## 2020-08-17 DIAGNOSIS — I10 Essential (primary) hypertension: Secondary | ICD-10-CM

## 2020-08-17 DIAGNOSIS — M810 Age-related osteoporosis without current pathological fracture: Secondary | ICD-10-CM | POA: Diagnosis not present

## 2020-08-17 LAB — CBC WITH DIFFERENTIAL/PLATELET
Basophils Absolute: 0.1 10*3/uL (ref 0.0–0.2)
Basos: 2 %
EOS (ABSOLUTE): 0.1 10*3/uL (ref 0.0–0.4)
Eos: 2 %
Hematocrit: 42.9 % (ref 34.0–46.6)
Hemoglobin: 14 g/dL (ref 11.1–15.9)
Immature Grans (Abs): 0 10*3/uL (ref 0.0–0.1)
Immature Granulocytes: 0 %
Lymphocytes Absolute: 1.5 10*3/uL (ref 0.7–3.1)
Lymphs: 26 %
MCH: 28.6 pg (ref 26.6–33.0)
MCHC: 32.6 g/dL (ref 31.5–35.7)
MCV: 88 fL (ref 79–97)
Monocytes Absolute: 0.5 10*3/uL (ref 0.1–0.9)
Monocytes: 9 %
Neutrophils Absolute: 3.5 10*3/uL (ref 1.4–7.0)
Neutrophils: 61 %
Platelets: 244 10*3/uL (ref 150–450)
RBC: 4.89 x10E6/uL (ref 3.77–5.28)
RDW: 13 % (ref 11.7–15.4)
WBC: 5.7 10*3/uL (ref 3.4–10.8)

## 2020-08-17 LAB — CMP14+EGFR
ALT: 10 IU/L (ref 0–32)
AST: 18 IU/L (ref 0–40)
Albumin/Globulin Ratio: 1.7 (ref 1.2–2.2)
Albumin: 4.5 g/dL (ref 3.6–4.6)
Alkaline Phosphatase: 113 IU/L (ref 44–121)
BUN/Creatinine Ratio: 17 (ref 12–28)
BUN: 15 mg/dL (ref 8–27)
Bilirubin Total: 0.4 mg/dL (ref 0.0–1.2)
CO2: 23 mmol/L (ref 20–29)
Calcium: 9.6 mg/dL (ref 8.7–10.3)
Chloride: 100 mmol/L (ref 96–106)
Creatinine, Ser: 0.86 mg/dL (ref 0.57–1.00)
Globulin, Total: 2.7 g/dL (ref 1.5–4.5)
Glucose: 92 mg/dL (ref 65–99)
Potassium: 4.6 mmol/L (ref 3.5–5.2)
Sodium: 141 mmol/L (ref 134–144)
Total Protein: 7.2 g/dL (ref 6.0–8.5)
eGFR: 66 mL/min/{1.73_m2} (ref >59)

## 2020-08-17 LAB — LIPID PANEL
Chol/HDL Ratio: 3.5 ratio (ref 0.0–4.4)
Cholesterol, Total: 180 mg/dL (ref 100–199)
HDL: 51 mg/dL (ref >39)
LDL Chol Calc (NIH): 102 mg/dL — ABNORMAL HIGH (ref 0–99)
Triglycerides: 153 mg/dL — ABNORMAL HIGH (ref 0–149)
VLDL Cholesterol Cal: 27 mg/dL (ref 5–40)

## 2020-08-17 MED ORDER — TRAZODONE HCL 50 MG PO TABS: 25.0000 mg | ORAL_TABLET | Freq: Every evening | ORAL | 3 refills | Status: DC | PRN

## 2020-08-17 MED ORDER — ZOLPIDEM TARTRATE 5 MG PO TABS: 5.0000 mg | ORAL_TABLET | Freq: Every evening | ORAL | 5 refills | Status: DC | PRN

## 2020-08-17 MED ORDER — DENOSUMAB 60 MG/ML ~~LOC~~ SOSY: 60.0000 mg | PREFILLED_SYRINGE | Freq: Once | SUBCUTANEOUS | 0 refills | Status: AC

## 2020-08-17 NOTE — Progress Notes (Signed)
BP 140/80   Pulse 74   Ht 5' 5"  (1.651 m)   Wt 147 lb (66.7 kg)   SpO2 98%   BMI 24.46 kg/m    Subjective:   Patient ID: Rhonda [name], female    DOB: January 22, 1933, 87 y.o.   MRN: [id number]  HPI: Rhonda [name] is a 87 y.o. female presenting on 08/17/2020 for Medical Management of Chronic Issues and Insomnia (Refills needed for ambien)   HPI Insomnia recheck Current rx-Ambien 5 mg nightly. # meds rx-30 Effectiveness of current meds-works well Adverse reactions form meds-none  Pill count performed-No Last drug screen -03/04/2020 ( high risk q13m moderate risk q664mlow risk yearly ) Urine drug screen today- No [place]  If [place] were their any concerning findings? -None  No flowsheet data found.   Controlled substance contract signed on: 03/04/2020  Hypertension and A. fib Patient is currently on flecainide and Eliquis and diltiazem, and their blood pressure today is 140/80. Patient denies any lightheadedness or dizziness. Patient denies headaches, blurred vision, chest pains, shortness of breath, or weakness. Denies any side effects from medication and is content with current medication.   Osteoporosis/osteopenia Fractures or history of fracture: None Medication: Tried Fosamax but had too much stomach issues, tried it for 2 weeks. Duration of treatment: Not on anything currently Last bone density scan: Last year Last T score: -3.7  Relevant past medical, surgical, family and social history reviewed and updated as indicated. Interim medical history since our last visit reviewed. Allergies and medications reviewed and updated.  Review of Systems  Constitutional: Negative for chills and fever.  Eyes: Negative for visual disturbance.  Respiratory: Negative for chest tightness and shortness of breath.   Cardiovascular: Negative for chest pain and leg swelling.  Genitourinary: Negative for difficulty urinating and dysuria.  Musculoskeletal:  Negative for back pain and gait problem.  Skin: Negative for rash.  Neurological: Negative for dizziness, light-headedness and headaches.  Psychiatric/Behavioral: Negative for agitation and behavioral problems.  All other systems reviewed and are negative.   Per HPI unless specifically indicated above   Allergies as of 08/17/2020   No Known Allergies     Medication List       Accurate as of August 17, 2020  9:20 AM. If you have any questions, ask your nurse or doctor.        STOP taking these medications   alendronate 70 MG tablet Commonly known as: Fosamax Stopped by: JoFransisca Kaufmannettinger, MD     TAKE these medications   acetaminophen 650 MG CR tablet Commonly known as: TYLENOL Take 1,300 mg by mouth every 8 (eight) hours as needed for pain.   cholecalciferol 25 MCG (1000 UNIT) tablet Commonly known as: VITAMIN D3 Take 2,000 Units by mouth 2 (two) times daily.   denosumab 60 MG/ML Sosy injection Commonly known as: PROLIA Inject 60 mg into the skin once for 1 dose. Started by: JoFransisca Kaufmannettinger, MD   diltiazem 360 MG 24 hr capsule Commonly known as: CARDIZEM CD Take 1 capsule (360 mg total) by mouth daily.   Eliquis 5 MG Tabs tablet Generic drug: apixaban TAKE ONE TABLET BY MOUTH TWICE DAILY   flecainide 50 MG tablet Commonly known as: TAMBOCOR Take 1 tablet (50 mg total) by mouth 2 (two) times daily.   traZODone 50 MG tablet Commonly known as: DESYREL Take 0.5-1 tablets (25-50 mg total) by mouth at bedtime as needed for sleep. Started by: JoFransisca Kaufmannettinger,  MD   zolpidem 5 MG tablet Commonly known as: AMBIEN Take 1 tablet (5 mg total) by mouth at bedtime as needed for sleep. Put on file until patient asked for refill What changed: additional instructions Changed by: Fransisca Kaufmann Rhonda Yeatman, MD        Objective:   BP 140/80   Pulse 74   Ht 5' 5"  (1.651 m)   Wt 147 lb (66.7 kg)   SpO2 98%   BMI 24.46 kg/m   Wt Readings from Last 3 Encounters:   08/17/20 147 lb (66.7 kg)  06/01/20 147 lb (66.7 kg)  03/09/20 162 lb (73.5 kg)    Physical Exam Vitals and nursing note reviewed.  Constitutional:      General: She is not in acute distress.    Appearance: She is well-developed. She is not diaphoretic.  Eyes:     Conjunctiva/sclera: Conjunctivae normal.  Cardiovascular:     Rate and Rhythm: Normal rate. Rhythm irregular.     Heart sounds: Normal heart sounds. No murmur heard.   Pulmonary:     Effort: Pulmonary effort is normal. No respiratory distress.     Breath sounds: Normal breath sounds. No wheezing.  Musculoskeletal:        General: No tenderness. Normal range of motion.  Skin:    General: Skin is warm and dry.     Findings: No rash.  Neurological:     Mental Status: She is alert and oriented to person, place, and time.     Coordination: Coordination normal.  Psychiatric:        Behavior: Behavior normal.       Assessment & Plan:   Problem List Items Addressed This Visit      Cardiovascular and Mediastinum   Atrial fibrillation ([place])   Relevant Orders   CBC with Differential/Platelet   CMP14+EGFR   Lipid panel   Essential hypertension - Primary   Relevant Orders   CMP14+EGFR     Musculoskeletal and Integument   Osteoporosis   Relevant Medications   denosumab (PROLIA) 60 MG/ML SOSY injection     Other   Insomnia   Relevant Medications   zolpidem (AMBIEN) 5 MG tablet   traZODone (DESYREL) 50 MG tablet      Failed Fosamax because of stomach issues, will try Prolia.  Patient wants to try something as not controlled, will try trazodone and if works well then she will phase off of Ambien Follow up plan: Return in about 6 months (around 02/16/2021), or if symptoms worsen or fail to improve, for Insomnia and osteoporosis.  Counseling provided for all of the vaccine components No orders of the defined types were placed in this encounter.   Caryl Pina, MD [facility] 08/17/2020, 9:20 AM

## 2020-08-17 NOTE — Patient Instructions (Signed)
Suggest 800 international units of vitamin D daily and 1200 mg elemental calcium for osteopenia or osteoporosis.    Will send for Prolia

## 2020-08-18 NOTE — Telephone Encounter (Signed)
Pt states that she tried taking the Trazodone last night and did not sleep at all. Pt was trying to change from Ambien.  Since she was unable to sleep she is going to keep taking the Ambien.  Pt was told by pharmacy that Prolia would cost $200. Pt states that she is not going to pay for that. Informed pt that we would check to see if we received a prior auth on it and that we would call her back to let her know.

## 2020-08-18 NOTE — Telephone Encounter (Signed)
I would recommend to continue trying the trazodone, you can mix in the Ambien some but keep trying the trazodone for a couple weeks

## 2020-08-18 NOTE — Telephone Encounter (Signed)
Informed pt that we have not received anything in regards to Prolia. She states that she will continue with Vit D and Ca for now.  Pt made aware that she should try the Trazodone for 2 weeks to get used to it. Pt states that she took a whole tablet last night and the instructions were to start with a half. Informed pt to take the [place] tonight she she did not sleep well last night and start over with the Trazodone tomorrow night. Pt was instructed that she could add ambien in some if needed. Pt understood.

## 2020-08-29 ENCOUNTER — Ambulatory Visit: Payer: PPO | Admitting: Family Medicine

## 2020-09-14 ENCOUNTER — Telehealth: Payer: Self-pay | Admitting: Family Medicine

## 2020-10-04 NOTE — Telephone Encounter (Signed)
Patient does not want to get the Prolia injection.

## 2020-11-25 DIAGNOSIS — Z96651 Presence of right artificial knee joint: Secondary | ICD-10-CM | POA: Diagnosis not present

## 2020-12-05 ENCOUNTER — Other Ambulatory Visit (HOSPITAL_COMMUNITY): Payer: Self-pay | Admitting: Family Medicine

## 2020-12-05 DIAGNOSIS — Z1231 Encounter for screening mammogram for malignant neoplasm of breast: Secondary | ICD-10-CM

## 2020-12-28 ENCOUNTER — Ambulatory Visit (HOSPITAL_COMMUNITY)
Admission: RE | Admit: 2020-12-28 | Discharge: 2020-12-28 | Disposition: A | Discharge status: Home / Self Care | Payer: PPO | Source: Ambulatory Visit | Attending: Family Medicine | Admitting: Family Medicine

## 2020-12-28 ENCOUNTER — Other Ambulatory Visit: Payer: Self-pay

## 2020-12-28 DIAGNOSIS — Z1231 Encounter for screening mammogram for malignant neoplasm of breast: Secondary | ICD-10-CM | POA: Insufficient documentation

## 2021-01-13 ENCOUNTER — Other Ambulatory Visit: Payer: Self-pay | Admitting: Family Medicine

## 2021-01-13 DIAGNOSIS — F5101 Primary insomnia: Secondary | ICD-10-CM

## 2021-02-16 ENCOUNTER — Encounter: Payer: Self-pay | Admitting: Family Medicine

## 2021-02-16 ENCOUNTER — Other Ambulatory Visit: Payer: Self-pay

## 2021-02-16 ENCOUNTER — Ambulatory Visit (INDEPENDENT_AMBULATORY_CARE_PROVIDER_SITE_OTHER): Payer: PPO | Admitting: Family Medicine

## 2021-02-16 VITALS — BP 136/74 | HR 68 | Ht 65.0 in | Wt 163.0 lb

## 2021-02-16 DIAGNOSIS — F5101 Primary insomnia: Secondary | ICD-10-CM | POA: Diagnosis not present

## 2021-02-16 DIAGNOSIS — Z23 Encounter for immunization: Secondary | ICD-10-CM | POA: Diagnosis not present

## 2021-02-16 DIAGNOSIS — R7989 Other specified abnormal findings of blood chemistry: Secondary | ICD-10-CM

## 2021-02-16 DIAGNOSIS — I4811 Longstanding persistent atrial fibrillation: Secondary | ICD-10-CM

## 2021-02-16 DIAGNOSIS — I10 Essential (primary) hypertension: Secondary | ICD-10-CM

## 2021-02-16 MED ORDER — ZOLPIDEM TARTRATE 5 MG PO TABS: 5.0000 mg | ORAL_TABLET | Freq: Every evening | ORAL | 5 refills | Status: DC | PRN

## 2021-02-16 MED ORDER — FLECAINIDE ACETATE 50 MG PO TABS: 50.0000 mg | ORAL_TABLET | Freq: Two times a day (BID) | ORAL | 3 refills | Status: DC

## 2021-02-16 NOTE — Progress Notes (Signed)
BP 136/74   Pulse 68   Ht _0  (1.651 m)   Wt 163 lb (73.9 kg)   SpO2 97%   BMI 27.12 kg/m    Subjective:   Patient ID: Rhonda [name], female    DOB: November 04, 1932, 87 y.o.   MRN: [id number]  HPI: Rhonda [name] is a 87 y.o. female presenting on 02/16/2021 for Medical Management of Chronic Issues, Hypertension, Atrial Fibrillation, and Anxiety   HPI Hypertension Patient is currently on diltiazem and flecainide, and their blood pressure today is 136/74. Patient denies any lightheadedness or dizziness. Patient denies headaches, blurred vision, chest pains, shortness of breath, or weakness. Denies any side effects from medication and is content with current medication.   Insomnia recheck Current rx-Ambien 5 mg nightly as needed # meds rx-30 Effectiveness of current meds-works well Adverse reactions form meds-none  Pill count performed-No Last drug screen -03/04/2020 ( high risk q30m moderate risk q655mlow risk yearly ) U[place] drug [place] today- Yes [place]  If yes were their any concerning findings? -None  No flowsheet data found.   Controlled substance contract signed on: 03/04/2020  A. fib recheck Patient is currently on Cardizem and Eliquis for A. fib, sees cardiology as well.  Denies any bleeding  Relevant past medical, surgical, family and social history reviewed and updated as indicated. Interim medical history since our last visit reviewed. Allergies and medications reviewed and updated.  Review of Systems  Constitutional:  Negative for chills and fever.  Eyes:  Negative for visual disturbance.  Respiratory:  Negative for chest tightness and shortness of breath.   Cardiovascular:  Negative for chest pain and leg swelling.  Musculoskeletal:  Negative for back pain and gait problem.  Skin:  Negative for rash.  Neurological:  Negative for light-headedness and headaches.  Psychiatric/Behavioral:  Negative for agitation and behavioral  problems.   All other systems reviewed and are negative.  Per HPI unless specifically indicated above   Allergies as of 02/16/2021   No Known Allergies      Medication List        Accurate as of February 16, 2021  8:49 AM. If you have any questions, ask your nurse or doctor.          acetaminophen 650 MG CR tablet Commonly known as: TYLENOL Take 1,300 mg by mouth every 8 (eight) hours as needed for pain.   cholecalciferol 25 MCG (1000 UNIT) tablet Commonly known as: VITAMIN D3 Take 2,000 Units by mouth 2 (two) times daily.   diltiazem 360 MG 24 hr capsule Commonly known as: CARDIZEM CD Take 1 capsule (360 mg total) by mouth daily.   Eliquis 5 MG Tabs tablet Generic drug: apixaban TAKE ONE TABLET BY MOUTH TWICE DAILY   flecainide 50 MG tablet Commonly known as: TAMBOCOR Take 1 tablet (50 mg total) by mouth 2 (two) times daily.   traZODone 50 MG tablet Commonly known as: DESYREL Take 0.5-1 tablets (25-50 mg total) by mouth at bedtime as needed for sleep.   zolpidem 5 MG tablet Commonly known as: AMBIEN Take 1 tablet (5 mg total) by mouth at bedtime as needed for sleep. Put on file until patient asked for refill         Objective:   BP 136/74   Pulse 68   Ht _1  (1.651 m)   Wt 163 lb (73.9 kg)   SpO2 97%   BMI 27.12 kg/m   Wt Readings from Last 3  Encounters:  02/16/21 163 lb (73.9 kg)  08/17/20 147 lb (66.7 kg)  06/01/20 147 lb (66.7 kg)    Physical Exam Vitals and nursing note reviewed.  Constitutional:      General: She is not in acute distress.    Appearance: She is well-developed. She is not diaphoretic.  Eyes:     Conjunctiva/sclera: Conjunctivae normal.  Cardiovascular:     Rate and Rhythm: Normal rate. Rhythm irregular.     Heart sounds: Normal heart sounds. No murmur heard. Pulmonary:     Effort: Pulmonary effort is normal. No respiratory distress.     Breath sounds: Normal breath sounds. No wheezing.  Musculoskeletal:         General: Swelling (2+) present. No tenderness. Normal range of motion.  Skin:    General: Skin is warm and dry.     Findings: No rash.  Neurological:     Mental Status: She is alert and oriented to person, place, and time.     Coordination: Coordination normal.  Psychiatric:        Behavior: Behavior normal.      Assessment & Plan:   Problem List Items Addressed This Visit       Cardiovascular and Mediastinum   Atrial fibrillation (HCC)   Relevant Medications   flecainide (TAMBOCOR) 50 MG tablet   Other Relevant Orders   CBC with Differential/Platelet   CMP14+EGFR   Essential hypertension - Primary   Relevant Medications   flecainide (TAMBOCOR) 50 MG tablet   Other Relevant Orders   CBC with Differential/Platelet   CMP14+EGFR   Lipid panel     Other   Low serum vitamin D   Relevant Orders   VITAMIN D 25 Hydroxy (Vit-D Deficiency, Fractures)   Insomnia   Relevant Medications   zolpidem (AMBIEN) 5 MG tablet   Other Relevant Orders   ToxASSURE Select 13 (MW), Urine    Continue current medicines, continues to follow-up with cardiology.  No major health issues except arthritis complaints.  Seems to be doing well. Follow up plan: Return in about 6 months (around 08/17/2021), or if symptoms worsen or fail to improve, for Hypertension and insomnia and A. fib..  Counseling provided for all of the vaccine components Orders Placed This Encounter  Procedures   ToxASSURE Select 13 (MW), Urine   CBC with Differential/Platelet   CMP14+EGFR   Lipid panel   VITAMIN D 25 Hydroxy (Vit-D Deficiency, Fractures)    Caryl Pina, MD [facility] 02/16/2021, 8:49 AM

## 2021-02-17 ENCOUNTER — Ambulatory Visit (INDEPENDENT_AMBULATORY_CARE_PROVIDER_SITE_OTHER): Payer: PPO

## 2021-02-17 VITALS — Ht 65.0 in | Wt 163.0 lb

## 2021-02-17 DIAGNOSIS — Z0000 Encounter for general adult medical examination without abnormal findings: Secondary | ICD-10-CM | POA: Diagnosis not present

## 2021-02-17 LAB — CBC WITH DIFFERENTIAL/PLATELET
Basophils Absolute: 0.1 10*3/uL (ref 0.0–0.2)
Basos: 2 %
EOS (ABSOLUTE): 0.1 10*3/uL (ref 0.0–0.4)
Eos: 1 %
Hematocrit: 43.2 % (ref 34.0–46.6)
Hemoglobin: 14.6 g/dL (ref 11.1–15.9)
Immature Grans (Abs): 0 10*3/uL (ref 0.0–0.1)
Immature Granulocytes: 0 %
Lymphocytes Absolute: 1.4 10*3/uL (ref 0.7–3.1)
Lymphs: 23 %
MCH: 29.3 pg (ref 26.6–33.0)
MCHC: 33.8 g/dL (ref 31.5–35.7)
MCV: 87 fL (ref 79–97)
Monocytes Absolute: 0.5 10*3/uL (ref 0.1–0.9)
Monocytes: 8 %
Neutrophils Absolute: 4 10*3/uL (ref 1.4–7.0)
Neutrophils: 66 %
Platelets: 277 10*3/uL (ref 150–450)
RBC: 4.99 x10E6/uL (ref 3.77–5.28)
RDW: 11.9 % (ref 11.7–15.4)
WBC: 6.1 10*3/uL (ref 3.4–10.8)

## 2021-02-17 LAB — CMP14+EGFR
ALT: 10 IU/L (ref 0–32)
AST: 13 IU/L (ref 0–40)
Albumin/Globulin Ratio: 1.7 (ref 1.2–2.2)
Albumin: 4.5 g/dL (ref 3.6–4.6)
Alkaline Phosphatase: 99 IU/L (ref 44–121)
BUN/Creatinine Ratio: 20 (ref 12–28)
BUN: 17 mg/dL (ref 8–27)
Bilirubin Total: 0.4 mg/dL (ref 0.0–1.2)
CO2: 23 mmol/L (ref 20–29)
Calcium: 9.6 mg/dL (ref 8.7–10.3)
Chloride: 96 mmol/L (ref 96–106)
Creatinine, Ser: 0.85 mg/dL (ref 0.57–1.00)
Globulin, Total: 2.6 g/dL (ref 1.5–4.5)
Glucose: 99 mg/dL (ref 70–99)
Potassium: 4 mmol/L (ref 3.5–5.2)
Sodium: 135 mmol/L (ref 134–144)
Total Protein: 7.1 g/dL (ref 6.0–8.5)
eGFR: 67 mL/min/{1.73_m2} (ref >59)

## 2021-02-17 LAB — LIPID PANEL
Chol/HDL Ratio: 3.9 ratio (ref 0.0–4.4)
Cholesterol, Total: 174 mg/dL (ref 100–199)
HDL: 45 mg/dL (ref >39)
LDL Chol Calc (NIH): 97 mg/dL (ref 0–99)
Triglycerides: 187 mg/dL — ABNORMAL HIGH (ref 0–149)
VLDL Cholesterol Cal: 32 mg/dL (ref 5–40)

## 2021-02-17 LAB — VITAMIN D 25 HYDROXY (VIT D DEFICIENCY, FRACTURES): Vit D, 25-Hydroxy: 47.3 ng/mL (ref 30.0–100.0)

## 2021-02-17 NOTE — Progress Notes (Signed)
Subjective:   Rhonda [name] is a 87 y.o. female who presents for Medicare Annual (Subsequent) preventive examination.  Virtual Visit via Telephone Note  I connected with  Rhonda [name] on 02/17/21 at  8:15 AM EDT by telephone and verified that I am speaking with the correct person using two identifiers.  Location: Patient: Home Provider: [facility] Persons participating in the virtual visit: patient/Nurse Health Advisor   I discussed the limitations, risks, security and privacy concerns of performing an evaluation and management service by telephone and the availability of in person appointments. The patient expressed understanding and agreed to proceed.  Interactive audio and video telecommunications were attempted between this nurse and patient, however failed, due to patient having technical difficulties OR patient did not have access to video capability.  We continued and completed visit with audio only.  Some vital signs may be absent or patient reported.   Rhonda Soroka E Jacquelinne Speak, LPN   Review of Systems     Cardiac Risk Factors include: advanced age (>30men, >10 women);dyslipidemia;hypertension;Other (see comment), Risk factor comments: A.Fib     Objective:    Today's Vitals   02/17/21 0822  Weight: 163 lb (73.9 kg)  Height: 5\' 5"  (1.651 m)   Body mass index is 27.12 kg/m.  Advanced Directives 02/17/2021 02/17/2020 10/12/2019 09/28/2019 02/16/2019 10/18/2018 05/20/2018  Does Patient Have a Medical Advance Directive? Yes Yes No No Yes No Yes  Type of Advance Directive Healthcare Power of [place]  Does patient want to make changes to medical advance directive? - No - Patient declined No - Patient declined No - Patient declined No - Patient declined - No - Patient declined  Copy of [place] in Chart? No - copy requested No - copy requested - - No - copy requested -  No - copy requested  Would patient like information on creating a medical advance directive? - - No - Patient declined - - No - Patient declined No - Patient declined    Current Medications (verified) Outpatient Encounter Medications as of 02/17/2021  Medication Sig   acetaminophen (TYLENOL) 650 MG CR tablet Take 1,300 mg by mouth every 8 (eight) hours as needed for pain.    cholecalciferol (VITAMIN D3) 25 MCG (1000 UT) tablet Take 2,000 Units by mouth 2 (two) times daily.   diltiazem (CARDIZEM CD) 360 MG 24 hr capsule Take 1 capsule (360 mg total) by mouth daily.   ELIQUIS 5 MG TABS tablet TAKE ONE TABLET BY MOUTH TWICE DAILY   flecainide (TAMBOCOR) 50 MG tablet Take 1 tablet (50 mg total) by mouth 2 (two) times daily.   traZODone (DESYREL) 50 MG tablet Take 0.5-1 tablets (25-50 mg total) by mouth at bedtime as needed for sleep.   zolpidem (AMBIEN) 5 MG tablet Take 1 tablet (5 mg total) by mouth at bedtime as needed for sleep. Put on file until patient asked for refill   No facility-administered encounter medications on file as of 02/17/2021.    Allergies (verified) Patient has no known allergies.   History: Past Medical History:  Diagnosis Date   Arthritis    Atrial fibrillation ([place]) 05/2015   Dysrhythmia    afib   Hypertension    Osteopenia    Syncope 04/2014   Past Surgical History:  Procedure Laterality Date   BREAST EXCISIONAL BIOPSY Left    BREAST EXCISIONAL BIOPSY Right    COLONOSCOPY  2011   KNEE ARTHROCENTESIS Bilateral    KNEE ARTHROSCOPY     bil   POLYPECTOMY  11-01-2009   ROTATOR CUFF REPAIR     SHOULDER SURGERY Left    TOTAL KNEE ARTHROPLASTY Left    TOTAL KNEE ARTHROPLASTY Right 10/12/2019   Procedure: TOTAL KNEE ARTHROPLASTY;  Surgeon: Gaynelle Arabian, MD;  Location: [facility];  Service: Orthopedics;  Laterality: Right;  71min   VAGINAL HYSTERECTOMY     twice - vaginal and then oopherectomy   Family History  Problem Relation Age of Onset   Heart failure  Mother    Hypertension Father    Arthritis Father    Hypertension Sister    Arthritis Sister    Cancer Brother    Bladder Cancer Brother    Hypertension Brother    Diabetes Brother    Social History   Socioeconomic History   Marital status: Widowed    Spouse name: Not on file   Number of children: 1   Years of education: 12   Highest education level: High school graduate  Occupational History   Occupation: retired  Tobacco Use   Smoking status: Never   Smokeless tobacco: Never  Vaping Use   Vaping Use: Never used  Substance and Sexual Activity   Alcohol use: No    Alcohol/week: 0.0 standard drinks   Drug use: No   Sexual activity: Not Currently  Other Topics Concern   Not on file  Social History Narrative     Son lives 20 minutes away   Social Determinants of Health   Financial Resource Strain: Low Risk    Difficulty of Paying Living Expenses: Not hard at all  Food Insecurity: No Food Insecurity   Worried About Charity fundraiser in the Last Year: Never true   Arboriculturist in the Last Year: Never true  Transportation Needs: No Transportation Needs   Lack of Transportation (Medical): No   Lack of Transportation (Non-Medical): No  Physical Activity: Sufficiently Active   Days of Exercise per Week: 7 days   Minutes of Exercise per Session: 30 min  Stress: No Stress Concern Present   Feeling of Stress : Not at all  Social Connections: Moderately Integrated   Frequency of Communication with Friends and Family: More than three times a week   Frequency of Social Gatherings with Friends and Family: More than three times a week   Attends Religious Services: More than 4 times per year   Active Member of [employer] or Organizations: Yes   Attends Archivist Meetings: More than 4 times per year   Marital Status: Widowed    Tobacco Counseling Counseling given: Not Answered   Clinical Intake:  Pre-visit preparation completed: Yes  Pain : No/denies  pain     BMI - recorded: 27.12 Nutritional Status: BMI 25 -29 Overweight Nutritional Risks: None Diabetes: No  How often do you need to have someone help you when you read instructions, pamphlets, or other written materials from your doctor or pharmacy?: 1 - Never  Diabetic? no  Interpreter Needed?: No  Information entered by :: Rhonda Genao, LPN   Activities of Daily Living In your present state of health, do you have any difficulty performing the following activities: 02/17/2021  Hearing? N  Vision? N  Difficulty concentrating or making decisions? N  Walking or climbing stairs? Y  Comment hurts knees  Dressing or bathing? N  Doing errands, shopping? N  Preparing Food and eating ? N  Using the Toilet? N  In the past six months, have you accidently leaked urine? N  Do you have problems with loss of bowel control? N  Managing your Medications? N  Managing your Finances? N  Housekeeping or managing your Housekeeping? N  Some recent data might be hidden    Patient Care Team: Dettinger, Fransisca Kaufmann, MD as PCP - General (Family Medicine) [name], MD as PCP - Cardiology (Cardiology) [name], MD as Consulting Physician (Cardiology) Latanya Maudlin, MD as Consulting Physician (Orthopedic Surgery) Ilean China, RN as Registered Nurse  Indicate any recent [place] you may have received from other than [facility] providers in the past year (date may be approximate).     Assessment:   This is a routine wellness examination for Rhonda [name].  Hearing/Vision screen Hearing Screening - Comments:: Denies hearing difficulties Vision Screening - Comments:: Wears rx glasses - up to date with annual eye exams with [facility]  Dietary issues and exercise activities discussed: Current Exercise Habits: Home exercise routine, Type of exercise: walking;Other - see comments (stationary bike), Time (Minutes): 30, Frequency (Times/Week): 7, Weekly Exercise (Minutes/Week):  210, Intensity: Mild, Exercise limited by: orthopedic condition(s)   Goals Addressed             This Visit's Progress    DIET - INCREASE WATER INTAKE   On track    Try to drink 6-8 glasses of water daily       Depression Screen PHQ 2/9 Scores 02/17/2021 02/16/2021 08/17/2020 08/17/2020 06/01/2020 02/29/2020 02/17/2020  PHQ - 2 Score 0 0 0 0 0 0 0    Fall Risk Fall Risk  02/17/2021 02/16/2021 08/17/2020 08/17/2020 06/01/2020  Falls in the past year? 0 0 0 0 0  Number falls in past yr: 0 - - - -  Injury with Fall? 0 - - - -  Risk for fall due to : Orthopedic patient - - - -  Follow up Falls prevention discussed - - - -  Comment - - - - -    FALL [place]:  Any stairs in or around the home? Yes  If so, are there any without handrails? No  Home free of loose throw rugs in walkways, pet beds, electrical cords, etc? Yes  Adequate lighting in your home to reduce risk of falls? Yes   ASSISTIVE DEVICES UTILIZED TO PREVENT FALLS:  Life alert? No  Use of a cane, walker or w/c? No  Grab bars in the bathroom? Yes  Shower chair or bench in shower? Yes  Elevated toilet seat or a handicapped toilet? Yes   TIMED UP AND GO:  Was the test performed? No . Telephonic visit  Cognitive Function: Normal cognitive status assessed by direct observation by this Nurse Health Advisor. No abnormalities found.   MMSE - Mini Mental State Exam 02/11/2018 09/24/2016  Orientation to time 5 5  Orientation to Place 5 5  Registration 3 3  Attention/ Calculation 5 5  Recall 3 3  Language- name 2 objects 2 2  Language- repeat 1 1  Language- follow 3 step command 3 3  Language- read & follow direction 1 1  Write a sentence 1 1  Copy design 1 1  Total score 30 30     6CIT Screen 02/17/2020 02/16/2019  What Year? 0 points 0 points  What month? 0 points 0 points  What time? 0 points 0 points  Count back from 20 0 points 0 points  Months in reverse 0 points 0 points   Repeat phrase 0 points 2 points  Total Score 0 2    Immunizations Immunization History  Administered Date(s) Administered   Fluad Quad(high Dose 65+) 02/27/2019   Influenza, High Dose Seasonal PF 03/19/2016, 02/27/2017, 02/11/2018   Influenza, Seasonal, Injecte, Preservative Fre 03/23/2015   Influenza,inj,Quad PF,6+ Mos 03/23/2015, 02/16/2021   Influenza,inj,quad, With Preservative 01/21/2017   Influenza,trivalent, recombinat, inj, PF 03/11/2014   Influenza-Unspecified 02/13/2006   Moderna Sars-Covid-2 Vaccination 05/27/2019, 06/25/2019, 09/13/2020   PFIZER(Purple Top)SARS-COV-2 Vaccination 02/24/2020   Pneumococcal Conjugate-13 09/24/2016   Pneumococcal Polysaccharide-23 11/18/2017    TDAP status: Due, Education has been provided regarding the importance of this vaccine. Advised may receive this vaccine at local pharmacy or Health Dept. Aware to provide a copy of the vaccination record if obtained from local pharmacy or Health Dept. Verbalized acceptance and understanding.  Flu Vaccine status: Up to date  Pneumococcal vaccine status: Up to date  Covid-19 vaccine status: Completed vaccines  Qualifies for Shingles Vaccine? Yes   Zostavax completed No   Shingrix Completed?: No.    Education has been provided regarding the importance of this vaccine. Patient has been advised to call insurance company to determine out of pocket expense if they have not yet received this vaccine. Advised may also receive vaccine at local pharmacy or Health Dept. Verbalized acceptance and understanding.  Screening Tests Health Maintenance  Topic Date Due   TETANUS/TDAP  Never done   COVID-19 Vaccine (5 - Booster) 05/08/2021 (Originally 11/08/2020)   Zoster Vaccines- Shingrix (1 of 2) 05/19/2021 (Originally 11/27/1951)   DEXA SCAN  03/01/2022   Pneumonia Vaccine 86+ Years old  Completed   INFLUENZA VACCINE  Completed   HPV VACCINES  Aged Out    Health Maintenance  Health Maintenance Due  Topic  Date Due   TETANUS/TDAP  Never done    Colorectal cancer screening: No longer required.   Mammogram status: Completed 12/28/2020. Repeat every year  Bone Density status: Completed 03/01/2020. Results reflect: Bone density results: OSTEOPOROSIS. Repeat every 2 years.  Lung Cancer Screening: (Low Dose CT Chest recommended if Age 65-80 years, 30 pack-year currently smoking OR have quit w/in 15years.) does not qualify.   Additional Screening:  Hepatitis C Screening: does not qualify  Vision Screening: Recommended annual ophthalmology exams for early detection of glaucoma and other disorders of the eye. Is the patient up to date with their annual eye exam?  Yes  Who is the provider or what is the name of the office in which the patient attends annual eye exams? [facility] If pt is not established with a provider, would they like to be referred to a provider to establish care? No .   Dental Screening: Recommended annual dental exams for proper oral hygiene  Community Resource Referral / Chronic Care Management: CRR required this visit?  No   CCM required this visit?  No      Plan:     I have personally reviewed and noted the following in the patient's chart:   Medical and social history Use of alcohol, tobacco or illicit drugs  Current medications and supplements including opioid prescriptions.  Functional ability and status Nutritional status Physical activity Advanced directives List of other physicians Hospitalizations, surgeries, and ER visits in previous 12 months Vitals Screenings to include cognitive, depression, and falls Referrals and appointments  In addition, I have reviewed and discussed with patient certain preventive protocols, quality metrics, and best practice recommendations. A written  personalized care plan for preventive services as well as general preventive health recommendations were provided to patient.     Sandrea Hammond, LPN   75/08/1831    Nurse Notes: None

## 2021-02-17 NOTE — Patient Instructions (Signed)
Rhonda [name] , Thank you for taking time to come for your Medicare Wellness Visit. I appreciate your ongoing commitment to your health goals. Please review the following plan we discussed and let me know if I can assist you in the future.   Screening recommendations/referrals: Colonoscopy: Done 12/09/2014 - no repeat Mammogram: Done 12/28/2020 - Repeat annually Bone Density: Done 03/01/2020 - Repeat every 2 years  Recommended yearly ophthalmology/optometry visit for glaucoma screening and checkup Recommended yearly dental visit for hygiene and checkup  Vaccinations: Influenza vaccine: Done 02/16/2021 - Repeat annually Pneumococcal vaccine: Done 09/24/2016 & 11/18/2017 Tdap vaccine: Due. Every 10 years Shingles vaccine: Due. Get next year   Covid-19: Done 05/27/2019, 06/25/2019, 02/24/2020, & 09/13/2020 - due for another booster  Advanced directives: Please bring a copy of your health care power of attorney and living will to the office to be added to your chart at your convenience.   Conditions/risks identified: Aim for 30 minutes of exercise or brisk walking each day, drink 6-8 glasses of water and eat lots of fruits and vegetables.   Next appointment: Follow up in one year for your annual wellness visit    Preventive Care 65 Years and Older, Female Preventive care refers to lifestyle choices and visits with your health care provider that can promote health and wellness. What does preventive care include? A yearly physical exam. This is also called an annual well check. Dental exams once or twice a year. Routine eye exams. Ask your health care provider how often you should have your eyes checked. Personal lifestyle choices, including: Daily care of your teeth and gums. Regular physical activity. Eating a healthy diet. Avoiding tobacco and drug use. Limiting alcohol use. Practicing safe sex. Taking low-dose aspirin every day. Taking vitamin and mineral supplements as recommended by your  health care provider. What happens during an annual well check? The services and screenings done by your health care provider during your annual well check will depend on your age, overall health, lifestyle risk factors, and family history of disease. Counseling  Your health care provider may ask you questions about your: Alcohol use. Tobacco use. Drug use. Emotional well-being. Home and relationship well-being. Sexual activity. Eating habits. History of falls. Memory and ability to understand (cognition). Work and work Statistician. Reproductive health. Screening  You may have the following tests or measurements: Height, weight, and BMI. Blood pressure. Lipid and cholesterol levels. These may be checked every 5 years, or more frequently if you are over 79 years old. Skin check. Lung cancer screening. You may have this screening every year starting at age 47 if you have a 30-pack-year history of smoking and currently smoke or have quit within the past 15 years. Fecal occult blood test (FOBT) of the stool. You may have this test every year starting at age 44. Flexible sigmoidoscopy or colonoscopy. You may have a sigmoidoscopy every 5 years or a colonoscopy every 10 years starting at age 95. Hepatitis C blood test. Hepatitis B blood test. Sexually transmitted disease (STD) testing. Diabetes screening. This is done by checking your blood sugar (glucose) after you have not eaten for a while (fasting). You may have this done every 1-3 years. Bone density scan. This is done to screen for osteoporosis. You may have this done starting at age 64. Mammogram. This may be done every 1-2 years. Talk to your health care provider about how often you should have regular mammograms. Talk with your health care provider about your test results, treatment options,  and if necessary, the need for more tests. Vaccines  Your health care provider may recommend certain vaccines, such as: Influenza vaccine.  This is recommended every year. Tetanus, diphtheria, and acellular pertussis (Tdap, Td) vaccine. You may need a Td booster every 10 years. Zoster vaccine. You may need this after age 44. Pneumococcal 13-valent conjugate (PCV13) vaccine. One dose is recommended after age 24. Pneumococcal polysaccharide (PPSV23) vaccine. One dose is recommended after age 42. Talk to your health care provider about which screenings and vaccines you need and how often you need them. This information is not intended to replace advice given to you by your health care provider. Make sure you discuss any questions you have with your health care provider. Document Released: 05/06/2015 Document Revised: 12/28/2015 Document Reviewed: 02/08/2015 Elsevier Interactive Patient Education  2017 [place] Prevention in the Home Falls can cause injuries. They can happen to people of all ages. There are many things you can do to make your home safe and to help prevent falls. What can I do on the outside of my home? Regularly fix the edges of walkways and driveways and fix any cracks. Remove anything that might make you trip as you walk through a door, such as a raised step or threshold. Trim any bushes or trees on the path to your home. Use bright outdoor lighting. Clear any walking paths of anything that might make someone trip, such as rocks or tools. Regularly check to see if handrails are loose or broken. Make sure that both sides of any steps have handrails. Any raised decks and porches should have guardrails on the edges. Have any leaves, snow, or ice cleared regularly. Use sand or salt on walking paths during winter. Clean up any spills in your garage right away. This includes oil or grease spills. What can I do in the bathroom? Use night lights. Install grab bars by the toilet and in the tub and shower. Do not use towel bars as grab bars. Use non-skid mats or decals in the tub or shower. If you need to sit  down in the shower, use a plastic, non-slip stool. Keep the floor dry. Clean up any water that spills on the floor as soon as it happens. Remove soap buildup in the tub or shower regularly. Attach bath mats securely with double-sided non-slip rug tape. Do not have throw rugs and other things on the floor that can make you trip. What can I do in the bedroom? Use night lights. Make sure that you have a light by your bed that is easy to reach. Do not use any sheets or blankets that are too big for your bed. They should not hang down onto the floor. Have a firm chair that has side arms. You can use this for support while you get dressed. Do not have throw rugs and other things on the floor that can make you trip. What can I do in the kitchen? Clean up any spills right away. Avoid walking on wet floors. Keep items that you use a lot in easy-to-reach places. If you need to reach something above you, use a strong step stool that has a grab bar. Keep electrical cords out of the way. Do not use floor polish or wax that makes floors slippery. If you must use wax, use non-skid floor wax. Do not have throw rugs and other things on the floor that can make you trip. What can I do with my stairs? Do not leave  any items on the stairs. Make sure that there are handrails on both sides of the stairs and use them. Fix handrails that are broken or loose. Make sure that handrails are as long as the stairways. Check any carpeting to make sure that it is firmly attached to the stairs. Fix any carpet that is loose or worn. Avoid having throw rugs at the top or bottom of the stairs. If you do have throw rugs, attach them to the floor with carpet tape. Make sure that you have a light switch at the top of the stairs and the bottom of the stairs. If you do not have them, ask someone to add them for you. What else can I do to help prevent falls? Wear shoes that: Do not have high heels. Have rubber bottoms. Are  comfortable and fit you well. Are closed at the toe. Do not wear sandals. If you use a stepladder: Make sure that it is fully opened. Do not climb a closed stepladder. Make sure that both sides of the stepladder are locked into place. Ask someone to hold it for you, if possible. Clearly mark and make sure that you can see: Any grab bars or handrails. First and last steps. Where the edge of each step is. Use tools that help you move around (mobility aids) if they are needed. These include: Canes. Walkers. Scooters. Crutches. Turn on the lights when you go into a dark area. Replace any light bulbs as soon as they burn out. Set up your furniture so you have a clear path. Avoid moving your furniture around. If any of your floors are uneven, fix them. If there are any pets around you, be aware of where they are. Review your medicines with your doctor. Some medicines can make you feel dizzy. This can increase your chance of falling. Ask your doctor what other things that you can do to help prevent falls. This information is not intended to replace advice given to you by your health care provider. Make sure you discuss any questions you have with your health care provider. Document Released: 02/03/2009 Document Revised: 09/15/2015 Document Reviewed: 05/14/2014 Elsevier Interactive Patient Education  2017 [employer].

## 2021-02-22 LAB — TOXASSURE SELECT 13 (MW), URINE

## 2021-03-06 ENCOUNTER — Telehealth: Payer: Self-pay | Admitting: Family Medicine

## 2021-03-06 ENCOUNTER — Other Ambulatory Visit: Payer: Self-pay | Admitting: Family Medicine

## 2021-03-06 DIAGNOSIS — M1711 Unilateral primary osteoarthritis, right knee: Secondary | ICD-10-CM

## 2021-03-06 MED ORDER — DICLOFENAC SODIUM 1 % EX GEL: 2.0000 g | Freq: Four times a day (QID) | CUTANEOUS | 5 refills | Status: DC

## 2021-03-06 NOTE — Telephone Encounter (Signed)
  Prescription Request  03/06/2021  Is this a "Controlled Substance" medicine? no  Have you seen your PCP in the last 2 weeks? NO  If YES, route message to pool  -  If NO, patient needs to be scheduled for appointment.  What is the name of the medication or equipment? Diclofenac Sodium 1% gel. Patient is buying it OTC and wants a RX called in  Have you contacted your pharmacy to request a refill? NO   Which [facility] you [facility]   Patient notified that their request is being sent to the clinical staff for review and that they should receive a response within 2 business days.

## 2021-03-06 NOTE — Progress Notes (Unsigned)
Sent Voltaren gel for the patient to the pharmacy

## 2021-03-06 NOTE — Telephone Encounter (Signed)
Please advise on Rx for Diclofenac 1% gel so insurance will pay for her to get more Last OV 02/16/21 Next OV 08/17/20

## 2021-03-06 NOTE — Progress Notes (Signed)
Cardiology Office Note   Date:  03/08/2021   ID:  Rhonda, [name] 06-17-1932, MRN [id number]  PCP:  Dettinger, Fransisca Kaufmann, MD  Cardiologist:   [name], MD   Chief Complaint  Patient presents with   Atrial Fibrillation       History of Present Illness: Rhonda [name] is a 87 y.o. female who with atrial fib.  She was in the hospital in Jan with this.  She was treated with flecainide, Eliquis and Diltiazem.   In Feb she had a POET ([employer] Exercise Treadmill). In late June 2020 she was in the ED with SVT. She was treated with adenosine.   She has had lower extremity swelling.  She had no evidence of a DVT.  I treated her with Lasix.       She did have 1 episode of palpitations 2 months ago.  She said her heart rate was in the 170s for couple of hours.  She laid down and it finally went away.  She has had a little lower extremity swelling.  Otherwise she has no acute cardiovascular complaints.  Past Medical History:  Diagnosis Date   Arthritis    Atrial fibrillation ([place]) 05/2015   Dysrhythmia    afib   Hypertension    Osteopenia    Syncope 04/2014    Past Surgical History:  Procedure Laterality Date   BREAST EXCISIONAL BIOPSY Left    BREAST EXCISIONAL BIOPSY Right    COLONOSCOPY  2011   KNEE ARTHROCENTESIS Bilateral    KNEE ARTHROSCOPY     bil   POLYPECTOMY  11-01-2009   ROTATOR CUFF REPAIR     SHOULDER SURGERY Left    TOTAL KNEE ARTHROPLASTY Left    TOTAL KNEE ARTHROPLASTY Right 10/12/2019   Procedure: TOTAL KNEE ARTHROPLASTY;  Surgeon: Gaynelle Arabian, MD;  Location: [facility];  Service: Orthopedics;  Laterality: Right;  8min   VAGINAL HYSTERECTOMY     twice - vaginal and then oopherectomy     Current Outpatient Medications  Medication Sig Dispense Refill   acetaminophen (TYLENOL) 650 MG CR tablet Take 1,300 mg by mouth every 8 (eight) hours as needed for pain.      cholecalciferol (VITAMIN D3) 25 MCG (1000 UT) tablet Take 2,000 Units by mouth  2 (two) times daily.     diclofenac Sodium (VOLTAREN) 1 % GEL Apply 2 g topically 4 (four) times daily. 350 g 5   diltiazem (CARDIZEM CD) 360 MG 24 hr capsule Take 1 capsule (360 mg total) by mouth daily. 90 capsule 3   ELIQUIS 5 MG TABS tablet TAKE ONE TABLET BY MOUTH TWICE DAILY 180 tablet 1   flecainide (TAMBOCOR) 50 MG tablet Take 1 tablet (50 mg total) by mouth 2 (two) times daily. 180 tablet 3   zolpidem (AMBIEN) 5 MG tablet Take 1 tablet (5 mg total) by mouth at bedtime as needed for sleep. Put on file until patient asked for refill 30 tablet 5   traZODone (DESYREL) 50 MG tablet Take 0.5-1 tablets (25-50 mg total) by mouth at bedtime as needed for sleep. (Patient not taking: Reported on 03/08/2021) 30 tablet 3   No current facility-administered medications for this visit.    Allergies:   Patient has no known allergies.    ROS:  Please see the history of present illness.   Otherwise, review of systems are positive for none.   All other systems are reviewed and negative.    PHYSICAL EXAM: VS:  BP 130/68   Pulse 67   Ht 5\' 5"  (1.651 m)   Wt 165 lb (74.8 kg)   BMI 27.46 kg/m  , BMI Body mass index is 27.46 kg/m. GENERAL:  Well appearing NECK:  No jugular venous distention, waveform within normal limits, carotid upstroke brisk and symmetric, no bruits, no thyromegaly LUNGS:  Clear to auscultation bilaterally CHEST:  Unremarkable HEART:  PMI not displaced or sustained,S1 and S2 within normal limits, no S3,  no clicks, no rubs, no murmurs, irregular ABD:  Flat, positive bowel sounds normal in frequency in pitch, no bruits, no rebound, no guarding, no midline pulsatile mass, no hepatomegaly, no splenomegaly EXT:  2 plus pulses throughout, no edema, no cyanosis no clubbing   EKG:  EKG is ordered today. Sinus rhythm, rate 67, leftward axis, intervals within normal limits, no acute ST-T wave changes.  Few premature contractions.    Recent Labs: 02/16/2021: ALT 10; BUN 17;  Creatinine, Ser 0.85; Hemoglobin 14.6; Platelets 277; Potassium 4.0; Sodium 135    Lipid Panel    Component Value Date/Time   CHOL 174 02/16/2021 0855   TRIG 187 (H) 02/16/2021 0855   HDL 45 02/16/2021 0855   CHOLHDL 3.9 02/16/2021 0855   LDLCALC 97 02/16/2021 0855      Wt Readings from Last 3 Encounters:  03/08/21 165 lb (74.8 kg)  02/17/21 163 lb (73.9 kg)  02/16/21 163 lb (73.9 kg)      Other studies Reviewed: Additional studies/ records that were reviewed today include:   Labs. Review of the above records demonstrates: See elsewhere   ASSESSMENT AND PLAN:  Persistent atrial fibrillation:   She is maintaining sinus rhythm.  She does have some palpitations and we talked about Valsalva to treat possible SVT and possibly taking an extra flecainide if this does not break tacky arrhythmias and if they are persistent.  Thankfully these are not happening frequently.  She tolerates anticoagulation.   Hypertension:    Blood pressure is controlled.  No change in therapy.   SVT: As above.   Swelling:   This is not particularly problematic.  She wears compression stockings sometimes.  Though not on her list I would prescribe Lasix if she calls and states she needs it occasionally.  She does not want a prescription at this time but might call in the future.     Current medicines are reviewed at length with the patient today.  The patient does not have concerns regarding medicines.  The following changes have been made: None  Labs/ tests ordered today include: None  Orders Placed This Encounter  Procedures   EKG 12-Lead      Disposition:   FU with me in 12 months.    Signed, [name], MD  03/08/2021 10:27 AM    [facility]

## 2021-03-08 ENCOUNTER — Ambulatory Visit (INDEPENDENT_AMBULATORY_CARE_PROVIDER_SITE_OTHER): Payer: PPO | Admitting: Cardiology

## 2021-03-08 ENCOUNTER — Other Ambulatory Visit: Payer: Self-pay

## 2021-03-08 ENCOUNTER — Encounter: Payer: Self-pay | Admitting: Cardiology

## 2021-03-08 VITALS — BP 130/68 | HR 67 | Ht 65.0 in | Wt 165.0 lb

## 2021-03-08 DIAGNOSIS — M7989 Other specified soft tissue disorders: Secondary | ICD-10-CM

## 2021-03-08 DIAGNOSIS — I4819 Other persistent atrial fibrillation: Secondary | ICD-10-CM

## 2021-03-08 DIAGNOSIS — I10 Essential (primary) hypertension: Secondary | ICD-10-CM

## 2021-03-08 NOTE — Patient Instructions (Signed)
Medication Instructions:  The current medical regimen is effective;  continue present plan and medications.  *If you need a refill on your cardiac medications before your next appointment, please call your pharmacy*  Follow-Up: At [facility], you and your health needs are our priority.  As part of our continuing mission to provide you with exceptional heart care, we have created designated Provider Care Teams.  These Care Teams include your primary Cardiologist (physician) and Advanced Practice Providers (APPs -  Physician Assistants and Nurse Practitioners) who all work together to provide you with the care you need, when you need it.  We recommend signing up for the patient portal called "MyChart".  Sign up information is provided on this After Visit Summary.  MyChart is used to connect with patients for Virtual Visits (Telemedicine).  Patients are able to view lab/test results, encounter notes, upcoming appointments, etc.  Non-urgent messages can be sent to your provider as well.   To learn more about what you can do with MyChart, go to [email or web address].    Your next appointment:   1 year(s)  The format for your next appointment:   In Person  Provider:   James Hochrein, MD   Thank you for choosing [facility]!!    

## 2021-04-11 ENCOUNTER — Other Ambulatory Visit: Payer: Self-pay | Admitting: Family Medicine

## 2021-04-11 ENCOUNTER — Other Ambulatory Visit: Payer: Self-pay | Admitting: Cardiology

## 2021-04-11 NOTE — Telephone Encounter (Signed)
Prescription refill request for Eliquis received.  Indication: afib  Last office visit: Hochrein, 03/08/2021 Scr: 0.85, 02/16/2021 Age: 87 yo  Weight: 74.8 kg   Refill sent.

## 2021-06-07 ENCOUNTER — Encounter: Payer: Self-pay | Admitting: *Deleted

## 2021-07-12 ENCOUNTER — Other Ambulatory Visit: Payer: Self-pay | Admitting: Family Medicine

## 2021-07-12 DIAGNOSIS — F5101 Primary insomnia: Secondary | ICD-10-CM

## 2021-08-10 ENCOUNTER — Telehealth: Payer: Self-pay | Admitting: Family Medicine

## 2021-08-10 DIAGNOSIS — F5101 Primary insomnia: Secondary | ICD-10-CM

## 2021-08-11 MED ORDER — ZOLPIDEM TARTRATE 5 MG PO TABS: 5.0000 mg | ORAL_TABLET | Freq: Every evening | ORAL | 0 refills | Status: DC | PRN

## 2021-08-11 NOTE — Telephone Encounter (Signed)
Sent 130-day prescription of Ambien for ?

## 2021-08-11 NOTE — Telephone Encounter (Signed)
Pt aware rx sent.  

## 2021-08-17 ENCOUNTER — Ambulatory Visit: Payer: PPO | Admitting: Family Medicine

## 2021-09-08 ENCOUNTER — Encounter: Payer: Self-pay | Admitting: Family Medicine

## 2021-09-08 ENCOUNTER — Ambulatory Visit (INDEPENDENT_AMBULATORY_CARE_PROVIDER_SITE_OTHER): Payer: PPO | Admitting: Family Medicine

## 2021-09-08 VITALS — BP 136/76 | HR 80 | Temp 98.2°F | Ht 65.0 in | Wt 164.0 lb

## 2021-09-08 DIAGNOSIS — F5101 Primary insomnia: Secondary | ICD-10-CM

## 2021-09-08 DIAGNOSIS — I10 Essential (primary) hypertension: Secondary | ICD-10-CM | POA: Diagnosis not present

## 2021-09-08 DIAGNOSIS — R7989 Other specified abnormal findings of blood chemistry: Secondary | ICD-10-CM

## 2021-09-08 DIAGNOSIS — Z23 Encounter for immunization: Secondary | ICD-10-CM | POA: Diagnosis not present

## 2021-09-08 MED ORDER — DILTIAZEM HCL ER BEADS 360 MG PO CP24: ORAL_CAPSULE | ORAL | 3 refills | Status: DC

## 2021-09-08 MED ORDER — TRAZODONE HCL 50 MG PO TABS: 25.0000 mg | ORAL_TABLET | Freq: Every evening | ORAL | 5 refills | Status: DC | PRN

## 2021-09-08 MED ORDER — ZOLPIDEM TARTRATE 5 MG PO TABS: 5.0000 mg | ORAL_TABLET | Freq: Every evening | ORAL | 5 refills | Status: DC | PRN

## 2021-09-08 NOTE — Addendum Note (Signed)
Addended by: Alphonzo Dublin on: 09/08/2021 11:10 AM   Modules accepted: Orders

## 2021-09-08 NOTE — Progress Notes (Signed)
BP 136/76   Pulse 80   Temp 98.2 F (36.8 C)   Ht 5' 5"  (1.651 m)   Wt 164 lb (74.4 kg)   SpO2 95%   BMI 27.29 kg/m    Subjective:   Patient ID: Rhonda [name], female    DOB: April 27, 1932, 88 y.o.   MRN: [id number]  HPI: Rhonda [name] is a 88 y.o. female presenting on 09/08/2021 for Medical Management of Chronic Issues, Hypertension, and Insomnia   HPI Hypertension and A-fib Patient is currently on diltiazem and flecainide, and their blood pressure today is 136/76. Patient denies any lightheadedness or dizziness. Patient denies headaches, blurred vision, chest pains, shortness of breath, or weakness. Denies any side effects from medication and is content with current medication.  She also sees cardiology to help manage this and she feels like things are going well with them.  Patient is also on Eliquis for A-fib  Insomnia recheck Current rx-Ambien 5 mg nightly. # meds rx-30 Effectiveness of current meds-works well, she has been on it for quite a while and it still seems to do good for her. Adverse reactions form meds-none  Pill count performed-No Last drug screen -03/20/2021 ( high risk q97m moderate risk q681mlow risk yearly ) Urine drug [place] today- No [place]  If yes were their any concerning findings? -None  No flowsheet data found.   Controlled substance contract signed on: 03/12/2021  Relevant past medical, surgical, family and social history reviewed and updated as indicated. Interim medical history since our last visit reviewed. Allergies and medications reviewed and updated.  Review of Systems  Constitutional:  Negative for chills and fever.  Eyes:  Negative for visual disturbance.  Respiratory:  Negative for chest tightness and shortness of breath.   Cardiovascular:  Negative for chest pain and leg swelling.  Musculoskeletal:  Positive for arthralgias (Arthritis and joint pains, specifically left shoulder, chronic but not too bad  today).  Skin:  Negative for rash.  Neurological:  Negative for headaches.  Psychiatric/Behavioral:  Positive for sleep disturbance. Negative for agitation, behavioral problems, self-injury and suicidal ideas.   All other systems reviewed and are negative.  Per HPI unless specifically indicated above   Allergies as of 09/08/2021   No Known Allergies      Medication List        Accurate as of Sep 08, 2021 10:54 AM. If you have any questions, ask your nurse or doctor.          acetaminophen 650 MG CR tablet Commonly known as: TYLENOL Take 1,300 mg by mouth every 8 (eight) hours as needed for pain.   cholecalciferol 25 MCG (1000 UNIT) tablet Commonly known as: VITAMIN D3 Take 2,000 Units by mouth 2 (two) times daily.   diclofenac Sodium 1 % Gel Commonly known as: Voltaren Apply 2 g topically 4 (four) times daily.   diltiazem 360 MG 24 hr capsule Commonly known as: TIAZAC TAKE ONE (1) CAPSULE EACH DAY   Eliquis 5 MG Tabs tablet Generic drug: apixaban TAKE ONE TABLET BY MOUTH TWICE DAILY   flecainide 50 MG tablet Commonly known as: TAMBOCOR Take 1 tablet (50 mg total) by mouth 2 (two) times daily.   traZODone 50 MG tablet Commonly known as: DESYREL Take 0.5-1 tablets (25-50 mg total) by mouth at bedtime as needed for sleep.   zolpidem 5 MG tablet Commonly known as: AMBIEN Take 1 tablet (5 mg total) by mouth at bedtime as needed for sleep.  Put on file until patient asked for refill         Objective:   BP 136/76   Pulse 80   Temp 98.2 F (36.8 C)   Ht 5' 5"  (1.651 m)   Wt 164 lb (74.4 kg)   SpO2 95%   BMI 27.29 kg/m   Wt Readings from Last 3 Encounters:  09/08/21 164 lb (74.4 kg)  03/08/21 165 lb (74.8 kg)  02/17/21 163 lb (73.9 kg)    Physical Exam Vitals and nursing note reviewed.  Constitutional:      General: She is not in acute distress.    Appearance: She is well-developed. She is not diaphoretic.  Eyes:     Conjunctiva/sclera:  Conjunctivae normal.  Cardiovascular:     Rate and Rhythm: Normal rate and regular rhythm.     Heart sounds: Normal heart sounds. No murmur heard. Pulmonary:     Effort: Pulmonary effort is normal. No respiratory distress.     Breath sounds: Normal breath sounds. No wheezing.  Musculoskeletal:        General: No tenderness. Normal range of motion.     Left shoulder: Crepitus present. No tenderness. Normal range of motion. Normal strength.  Skin:    General: Skin is warm and dry.     Findings: No rash.  Neurological:     Mental Status: She is alert and oriented to person, place, and time.     Coordination: Coordination normal.  Psychiatric:        Behavior: Behavior normal.      Assessment & Plan:   Problem List Items Addressed This Visit       Cardiovascular and Mediastinum   Essential hypertension - Primary   Relevant Medications   diltiazem (TIAZAC) 360 MG 24 hr capsule   Other Relevant Orders   CBC with Differential/Platelet   CMP14+EGFR   Lipid panel     Other   Insomnia   Relevant Medications   traZODone (DESYREL) 50 MG tablet   zolpidem (AMBIEN) 5 MG tablet   Other Relevant Orders   CBC with Differential/Platelet   CMP14+EGFR   Low serum vitamin D   Relevant Orders   CBC with Differential/Platelet   CMP14+EGFR   VITAMIN D 25 Hydroxy (Vit-D Deficiency, Fractures)    Continue current medicine, will do refills, will also check some blood work today.  No changes, will await blood work Follow up plan: Return in about 6 months (around 03/11/2022), or if symptoms worsen or fail to improve, for Hypertension and insomnia recheck.  Counseling provided for all of the vaccine components Orders Placed This Encounter  Procedures   CBC with Differential/Platelet   CMP14+EGFR   Lipid panel   VITAMIN D 25 Hydroxy (Vit-D Deficiency, Fractures)    Caryl Pina, MD [facility] 09/08/2021, 10:54 AM

## 2021-09-09 LAB — CBC WITH DIFFERENTIAL/PLATELET
Basophils Absolute: 0.1 10*3/uL (ref 0.0–0.2)
Basos: 2 %
EOS (ABSOLUTE): 0.1 10*3/uL (ref 0.0–0.4)
Eos: 2 %
Hematocrit: 43.4 % (ref 34.0–46.6)
Hemoglobin: 14.5 g/dL (ref 11.1–15.9)
Immature Grans (Abs): 0 10*3/uL (ref 0.0–0.1)
Immature Granulocytes: 0 %
Lymphocytes Absolute: 1.3 10*3/uL (ref 0.7–3.1)
Lymphs: 22 %
MCH: 28.8 pg (ref 26.6–33.0)
MCHC: 33.4 g/dL (ref 31.5–35.7)
MCV: 86 fL (ref 79–97)
Monocytes Absolute: 0.6 10*3/uL (ref 0.1–0.9)
Monocytes: 9 %
Neutrophils Absolute: 4 10*3/uL (ref 1.4–7.0)
Neutrophils: 65 %
Platelets: 298 10*3/uL (ref 150–450)
RBC: 5.03 x10E6/uL (ref 3.77–5.28)
RDW: 12.4 % (ref 11.7–15.4)
WBC: 6 10*3/uL (ref 3.4–10.8)

## 2021-09-09 LAB — CMP14+EGFR
ALT: 12 IU/L (ref 0–32)
AST: 18 IU/L (ref 0–40)
Albumin/Globulin Ratio: 1.7 (ref 1.2–2.2)
Albumin: 4.4 g/dL (ref 3.6–4.6)
Alkaline Phosphatase: 114 IU/L (ref 44–121)
BUN/Creatinine Ratio: 19 (ref 12–28)
BUN: 16 mg/dL (ref 8–27)
Bilirubin Total: 0.4 mg/dL (ref 0.0–1.2)
CO2: 24 mmol/L (ref 20–29)
Calcium: 9.8 mg/dL (ref 8.7–10.3)
Chloride: 97 mmol/L (ref 96–106)
Creatinine, Ser: 0.85 mg/dL (ref 0.57–1.00)
Globulin, Total: 2.6 g/dL (ref 1.5–4.5)
Glucose: 94 mg/dL (ref 70–99)
Potassium: 4.6 mmol/L (ref 3.5–5.2)
Sodium: 138 mmol/L (ref 134–144)
Total Protein: 7 g/dL (ref 6.0–8.5)
eGFR: 67 mL/min/{1.73_m2} (ref >59)

## 2021-09-09 LAB — LIPID PANEL
Chol/HDL Ratio: 3.4 ratio (ref 0.0–4.4)
Cholesterol, Total: 174 mg/dL (ref 100–199)
HDL: 51 mg/dL (ref >39)
LDL Chol Calc (NIH): 100 mg/dL — ABNORMAL HIGH (ref 0–99)
Triglycerides: 127 mg/dL (ref 0–149)
VLDL Cholesterol Cal: 23 mg/dL (ref 5–40)

## 2021-09-09 LAB — VITAMIN D 25 HYDROXY (VIT D DEFICIENCY, FRACTURES): Vit D, 25-Hydroxy: 43.5 ng/mL (ref 30.0–100.0)

## 2021-10-09 ENCOUNTER — Other Ambulatory Visit: Payer: Self-pay | Admitting: Cardiology

## 2021-10-09 NOTE — Telephone Encounter (Signed)
Prescription refill request for Eliquis received. Indication:Afib Last office visit:11/22 Scr:0.8 Age: 88 Weight:74.4 kg  Prescription refilled

## 2021-10-31 ENCOUNTER — Ambulatory Visit: Payer: Self-pay | Admitting: *Deleted

## 2021-10-31 NOTE — Chronic Care Management (AMB) (Signed)
  Chronic Care Management   Note  10/31/2021 Name: Rhonda [name] MRN: [id number] DOB: 1932-06-28   Patient has not recently engaged with the Chronic Care Management RN Care Manager. Removing RN Care Manager from Care Team and closing [place]. If patient is currently engaged with another CCM team member I will forward this encounter to inform them of my case closure. Patient may be eligible for re-engagement with RN Care Manager in the future if necessary and can discuss this with their PCP.  Chong Sicilian, BSN, RN-BC Embedded Chronic Care Manager [facility] Direct Dial: [phone]

## 2021-11-01 ENCOUNTER — Telehealth: Payer: Self-pay | Admitting: Cardiology

## 2021-11-01 NOTE — Telephone Encounter (Signed)
Called pt, she states it does not happen that often but when it happens I have this real funny feeling. When asked top describe the feeling she was unable to. "I just feel funny, I don't really feel dizzy or lightheaded. I just feel funny and I check my heart rate and it's low. Now it's back to normal. I was wondering if there is anything I should do or could do when this happens. I don't want to keep calling the doctor or go to the emergency room if I don't need to. I don't want to pass out." Pt denies any other symptoms. She does not think it is related to her medications because it is random. "Last time was 3 weeks ago, then today it happened while watching tv." Heart rate is normally between 60-80. Will get message to Dr. Percival Spanish for review.

## 2021-11-01 NOTE — Telephone Encounter (Signed)
STAT if HR is under 50 or over 120 (normal HR is 60-100 beats per minute)  What is your heart rate? 60  Do you have a log of your heart rate readings (document readings)?  38, 45, 60, 52  Do you have any other symptoms? no  Patient said her HR is irregular. She said she gets a funny feeling when it drops low. She does not do anything specific that makes her heart rate go low.   Patient was not sure if she should make an appointment or not. She can not drive to [place] on her own so she would like to go to [place] if possible

## 2021-11-03 NOTE — Telephone Encounter (Signed)
Called pt to relay Dr. Rosezella [place] message. She states "oh me! I've worn that monitor twice. Can not not wear it again?" Pt informed she can refuse it at this time if she wants. She states "my heart rate is fine right now. Can I call you back if it starts again?" Pt informed she can call back and we go from there. She verbalized understanding and thanked me for calling her back.

## 2021-11-29 ENCOUNTER — Other Ambulatory Visit: Payer: Self-pay | Admitting: *Deleted

## 2021-11-29 NOTE — Patient Outreach (Signed)
  Care Coordination   11/29/2021 Name: Rhonda [name] MRN: [id number] DOB: 08/23/1932   Care Coordination Outreach Attempts:  An unsuccessful telephone outreach was attempted today to offer the patient information about available care coordination services as a benefit of their health plan.   Follow Up Plan:  Additional outreach attempts will be made to offer the patient care coordination information and services.   Encounter Outcome:  No Answer  Care Coordination Interventions Activated:  No   Care Coordination Interventions:  No, not indicated    Emelia Loron RN, BSN [place] [phone] [email or web address]

## 2021-12-15 ENCOUNTER — Encounter: Payer: Self-pay | Admitting: Family Medicine

## 2021-12-15 ENCOUNTER — Ambulatory Visit (INDEPENDENT_AMBULATORY_CARE_PROVIDER_SITE_OTHER): Payer: PPO | Admitting: Family Medicine

## 2021-12-15 VITALS — BP 118/63 | HR 77 | Temp 98.2°F | Ht 65.0 in | Wt 167.0 lb

## 2021-12-15 DIAGNOSIS — R300 Dysuria: Secondary | ICD-10-CM

## 2021-12-15 DIAGNOSIS — N3001 Acute cystitis with hematuria: Secondary | ICD-10-CM | POA: Diagnosis not present

## 2021-12-15 LAB — URINALYSIS, ROUTINE W REFLEX MICROSCOPIC
Bilirubin, UA: NEGATIVE
Glucose, UA: NEGATIVE
Nitrite, UA: POSITIVE — AB
Specific Gravity, UA: >1.03 — ABNORMAL HIGH (ref 1.005–1.030)
Urobilinogen, Ur: 0.2 mg/dL (ref 0.2–1.0)
pH, UA: 5 (ref 5.0–7.5)

## 2021-12-15 LAB — MICROSCOPIC EXAMINATION
Renal Epithel, UA: NONE SEEN /hpf
WBC, UA: >30 /hpf — AB (ref 0–5)

## 2021-12-15 MED ORDER — SULFAMETHOXAZOLE-TRIMETHOPRIM 800-160 MG PO TABS: 1.0000 | ORAL_TABLET | Freq: Two times a day (BID) | ORAL | 0 refills | Status: AC

## 2021-12-15 NOTE — Progress Notes (Signed)
Subjective:  Patient ID: Rhonda [name], female    DOB: 03-16-1933, 88 y.o.   MRN: [id number]  Patient Care Team: Dettinger, Fransisca Kaufmann, MD as PCP - General (Family Medicine) [name], MD as PCP - Cardiology (Cardiology) [name], MD as Consulting Physician (Cardiology) Latanya Maudlin, MD as Consulting Physician (Orthopedic Surgery)   Chief Complaint:  Dysuria   HPI: Rhonda [name] is a 88 y.o. female presenting on 12/15/2021 for Dysuria   Pt presents today with complaints of dysuria, frequency, lower abdominal pressure, and dark colored urine. Onset Monday.   Dysuria  This is a new problem. The current episode started in the past 7 days. The problem occurs every urination. The quality of the pain is described as burning and aching. The pain is mild. There has been no fever. She is Not sexually active. There is No history of pyelonephritis. Associated symptoms include frequency, hesitancy and urgency. Pertinent negatives include no chills, discharge, flank pain, hematuria, nausea, possible pregnancy, sweats or vomiting. She has tried increased fluids for the symptoms. The treatment provided no relief.    Relevant past medical, surgical, family, and social history reviewed and updated as indicated.  Allergies and medications reviewed and updated. Data reviewed: Chart in Epic.   Past Medical History:  Diagnosis Date   Arthritis    Atrial fibrillation ([place]) 05/2015   Dysrhythmia    afib   Hypertension    Osteopenia    Syncope 04/2014    Past Surgical History:  Procedure Laterality Date   BREAST EXCISIONAL BIOPSY Left    BREAST EXCISIONAL BIOPSY Right    COLONOSCOPY  2011   KNEE ARTHROCENTESIS Bilateral    KNEE ARTHROSCOPY     bil   POLYPECTOMY  11-01-2009   ROTATOR CUFF REPAIR     SHOULDER SURGERY Left    TOTAL KNEE ARTHROPLASTY Left    TOTAL KNEE ARTHROPLASTY Right 10/12/2019   Procedure: TOTAL KNEE ARTHROPLASTY;  Surgeon: Gaynelle Arabian, MD;   Location: [facility];  Service: Orthopedics;  Laterality: Right;  64mn   VAGINAL HYSTERECTOMY     twice - vaginal and then oopherectomy    Social History   Socioeconomic History   Marital status: Widowed    Spouse name: Not on file   Number of children: 1   Years of education: 12   Highest education level: High school graduate  Occupational History   Occupation: retired  Tobacco Use   Smoking status: Never   Smokeless tobacco: Never  Vaping Use   Vaping Use: Never used  Substance and Sexual Activity   Alcohol use: No    Alcohol/week: 0.0 standard drinks of alcohol   Drug use: No   Sexual activity: Not Currently  Other Topics Concern   Not on file  Social History Narrative     Son lives 20 minutes away   Social Determinants of Health   Financial Resource Strain: Low Risk  (02/17/2021)   Overall Financial Resource Strain (CARDIA)    Difficulty of Paying Living Expenses: Not hard at all  Food Insecurity: No Food Insecurity (02/17/2021)   Hunger Vital Sign    Worried About Running Out of Food in the Last Year: Never true    RJewettin the Last Year: Never true  Transportation Needs: No Transportation Needs (02/17/2021)   PRAPARE - THydrologist(Medical): No    Lack of Transportation (Non-Medical): No  Physical Activity: Sufficiently Active (02/17/2021)  Exercise Vital Sign    Days of Exercise per Week: 7 days    Minutes of Exercise per Session: 30 min  Stress: No Stress Concern Present (02/17/2021)   [place]    Feeling of Stress : Not at all  Social Connections: Moderately Integrated (02/17/2021)   Social Connection and Isolation Panel [NHANES]    Frequency of Communication with Friends and Family: More than three times a week    Frequency of Social Gatherings with Friends and Family: More than three times a week    Attends Religious Services: More than 4 times per  year    Active Member of [employer] or Organizations: Yes    Attends Archivist Meetings: More than 4 times per year    Marital Status: Widowed  Intimate Partner Violence: Not At Risk (02/17/2021)   Humiliation, Afraid, Rape, and Kick questionnaire    Fear of Current or Ex-Partner: No    Emotionally Abused: No    Physically Abused: No    Sexually Abused: No    Outpatient Encounter Medications as of 12/15/2021  Medication Sig   acetaminophen (TYLENOL) 650 MG CR tablet Take 1,300 mg by mouth every 8 (eight) hours as needed for pain.    cholecalciferol (VITAMIN D3) 25 MCG (1000 UT) tablet Take 2,000 Units by mouth 2 (two) times daily.   diclofenac Sodium (VOLTAREN) 1 % GEL Apply 2 g topically 4 (four) times daily.   diltiazem (TIAZAC) 360 MG 24 hr capsule TAKE ONE (1) CAPSULE EACH DAY   ELIQUIS 5 MG TABS tablet TAKE ONE TABLET BY MOUTH TWICE DAILY   flecainide (TAMBOCOR) 50 MG tablet Take 1 tablet (50 mg total) by mouth 2 (two) times daily.   sulfamethoxazole-trimethoprim (BACTRIM DS) 800-160 MG tablet Take 1 tablet by mouth 2 (two) times daily for 7 days.   zolpidem (AMBIEN) 5 MG tablet Take 1 tablet (5 mg total) by mouth at bedtime as needed for sleep. Put on file until patient asked for refill   [DISCONTINUED] traZODone (DESYREL) 50 MG tablet Take 0.5-1 tablets (25-50 mg total) by mouth at bedtime as needed for sleep.   No facility-administered encounter medications on file as of 12/15/2021.    No Known Allergies  Review of Systems  Constitutional:  Negative for activity change, appetite change, chills, diaphoresis, fatigue, fever and unexpected weight change.  Respiratory:  Negative for cough and shortness of breath.   Cardiovascular:  Negative for chest pain, palpitations and leg swelling.  Gastrointestinal:  Positive for abdominal pain. Negative for abdominal distention, anal bleeding, blood in stool, constipation, diarrhea, nausea, rectal pain and vomiting.  Genitourinary:   Positive for dysuria, frequency, hesitancy and urgency. Negative for decreased urine volume, difficulty urinating, flank pain, hematuria, vaginal bleeding, vaginal discharge and vaginal pain.  Neurological:  Negative for dizziness, weakness and headaches.  Psychiatric/Behavioral:  Negative for confusion.   All other systems reviewed and are negative.       Objective:  BP 118/63   Pulse 77   Temp 98.2 F (36.8 C)   Ht _0  (1.651 m)   Wt 167 lb (75.8 kg)   SpO2 95%   BMI 27.79 kg/m    Wt Readings from Last 3 Encounters:  12/15/21 167 lb (75.8 kg)  09/08/21 164 lb (74.4 kg)  03/08/21 165 lb (74.8 kg)    Physical Exam Vitals and nursing note reviewed.  Constitutional:      General: She is not in  acute distress.    Appearance: Normal appearance. She is not ill-appearing, toxic-appearing or diaphoretic.  HENT:     Head: Normocephalic and atraumatic.  Eyes:     Pupils: Pupils are equal, round, and reactive to light.  Cardiovascular:     Rate and Rhythm: Normal rate. Rhythm irregularly irregular.     Heart sounds: Normal heart sounds. No murmur heard.    No friction rub. No gallop.  Pulmonary:     Effort: Pulmonary effort is normal.     Breath sounds: Normal breath sounds.  Abdominal:     General: Abdomen is flat. Bowel sounds are normal.     Palpations: Abdomen is soft.     Tenderness: There is abdominal tenderness in the suprapubic area. There is no right CVA tenderness, left CVA tenderness, guarding or rebound. Negative signs include Murphy's sign, Rovsing's sign, McBurney's sign, psoas sign and obturator sign.     Comments: Minimal lower abdominal tenderness, no rebound or guarding. No CVA tenderness   Skin:    General: Skin is warm and dry.     Capillary Refill: Capillary refill takes less than 2 seconds.  Neurological:     General: No focal deficit present.     Mental Status: She is alert and oriented to person, place, and time.  Psychiatric:        Mood and  Affect: Mood normal.        Behavior: Behavior normal.        Thought Content: Thought content normal.        Judgment: Judgment normal.     Results for orders placed or performed in visit on 09/08/21  CBC with Differential/Platelet  Result Value Ref Range   WBC 6.0 3.4 - 10.8 x10E3/uL   RBC 5.03 3.77 - 5.28 x10E6/uL   Hemoglobin 14.5 11.1 - 15.9 g/dL   Hematocrit 43.4 34.0 - 46.6 %   MCV 86 79 - 97 fL   MCH 28.8 26.6 - 33.0 pg   MCHC 33.4 31.5 - 35.7 g/dL   RDW 12.4 11.7 - 15.4 %   Platelets 298 150 - 450 x10E3/uL   Neutrophils 65 Not Estab. %   Lymphs 22 Not Estab. %   Monocytes 9 Not Estab. %   Eos 2 Not Estab. %   Basos 2 Not Estab. %   Neutrophils Absolute 4.0 1.4 - 7.0 x10E3/uL   Lymphocytes Absolute 1.3 0.7 - 3.1 x10E3/uL   Monocytes Absolute 0.6 0.1 - 0.9 x10E3/uL   EOS (ABSOLUTE) 0.1 0.0 - 0.4 x10E3/uL   Basophils Absolute 0.1 0.0 - 0.2 x10E3/uL   Immature Granulocytes 0 Not Estab. %   Immature Grans (Abs) 0.0 0.0 - 0.1 x10E3/uL  CMP14+EGFR  Result Value Ref Range   Glucose 94 70 - 99 mg/dL   BUN 16 8 - 27 mg/dL   Creatinine, Ser 0.85 0.57 - 1.00 mg/dL   eGFR 67 >59 mL/min/1.73   BUN/Creatinine Ratio 19 12 - 28   Sodium 138 134 - 144 mmol/L   Potassium 4.6 3.5 - 5.2 mmol/L   Chloride 97 96 - 106 mmol/L   CO2 24 20 - 29 mmol/L   Calcium 9.8 8.7 - 10.3 mg/dL   Total Protein 7.0 6.0 - 8.5 g/dL   Albumin 4.4 3.6 - 4.6 g/dL   Globulin, Total 2.6 1.5 - 4.5 g/dL   Albumin/Globulin Ratio 1.7 1.2 - 2.2   Bilirubin Total 0.4 0.0 - 1.2 mg/dL   Alkaline Phosphatase 114 44 - 121  IU/L   AST 18 0 - 40 IU/L   ALT 12 0 - 32 IU/L  Lipid panel  Result Value Ref Range   Cholesterol, Total 174 100 - 199 mg/dL   Triglycerides 127 0 - 149 mg/dL   HDL 51 >39 mg/dL   VLDL Cholesterol Cal 23 5 - 40 mg/dL   LDL Chol Calc (NIH) 100 (H) 0 - 99 mg/dL   Chol/HDL Ratio 3.4 0.0 - 4.4 ratio  VITAMIN D 25 Hydroxy (Vit-D Deficiency, Fractures)  Result Value Ref Range   Vit D,  25-Hydroxy 43.5 30.0 - 100.0 ng/mL       Pertinent labs & imaging results that were available during my care of the patient were reviewed by me and considered in my medical decision making.  Assessment & Plan:  Malan was seen today for dysuria.  Diagnoses and all orders for this visit:  Dysuria Acute cystitis with hematuria Urinalysis in office positive for nitrites and leukocytes. Culture pending. No indications of acute pyelonephritis or urosepsis. Will treat with bactrim. Increase water intake and avoid bladder irritants. Will change therapy if culture warrants. Repeat urine in 2 weeks. Report new, worsening, or persistent symptoms.  -     Urinalysis, Routine w reflex microscopic -     Urine Culture -     sulfamethoxazole-trimethoprim (BACTRIM DS) 800-160 MG tablet; Take 1 tablet by mouth 2 (two) times daily for 7 days.     Continue all other maintenance medications.  Follow up plan: Return in 2 weeks (on 12/29/2021), or if symptoms worsen or fail to improve, for urine recheck.   Continue healthy lifestyle choices, including diet (rich in fruits, vegetables, and lean proteins, and low in salt and simple carbohydrates) and exercise (at least 30 minutes of moderate physical activity daily).  Educational handout given for UTI  The above assessment and management plan was discussed with the patient. The patient verbalized understanding of and has agreed to the management plan. Patient is aware to call the clinic if they develop any new symptoms or if symptoms persist or worsen. Patient is aware when to return to the clinic for a follow-up visit. Patient educated on when it is appropriate to go to the emergency department.   Monia Pouch, FNP-C [facility] [phone]

## 2021-12-17 LAB — URINE CULTURE

## 2021-12-20 ENCOUNTER — Other Ambulatory Visit: Payer: Self-pay | Admitting: Family Medicine

## 2021-12-20 DIAGNOSIS — I4811 Longstanding persistent atrial fibrillation: Secondary | ICD-10-CM

## 2021-12-26 ENCOUNTER — Other Ambulatory Visit: Payer: Self-pay | Admitting: Family Medicine

## 2021-12-26 ENCOUNTER — Encounter: Payer: Self-pay | Admitting: Nurse Practitioner

## 2021-12-26 ENCOUNTER — Ambulatory Visit (INDEPENDENT_AMBULATORY_CARE_PROVIDER_SITE_OTHER): Payer: PPO | Admitting: Nurse Practitioner

## 2021-12-26 VITALS — BP 124/69 | HR 79 | Temp 97.4°F | Ht 65.0 in | Wt 164.1 lb

## 2021-12-26 DIAGNOSIS — M549 Dorsalgia, unspecified: Secondary | ICD-10-CM

## 2021-12-26 DIAGNOSIS — R8290 Unspecified abnormal findings in urine: Secondary | ICD-10-CM

## 2021-12-26 DIAGNOSIS — M5489 Other dorsalgia: Secondary | ICD-10-CM

## 2021-12-26 DIAGNOSIS — Z1231 Encounter for screening mammogram for malignant neoplasm of breast: Secondary | ICD-10-CM

## 2021-12-26 DIAGNOSIS — R109 Unspecified abdominal pain: Secondary | ICD-10-CM | POA: Diagnosis not present

## 2021-12-26 LAB — URINALYSIS, ROUTINE W REFLEX MICROSCOPIC
Bilirubin, UA: NEGATIVE
Glucose, UA: NEGATIVE
Nitrite, UA: NEGATIVE
Specific Gravity, UA: 1.02 (ref 1.005–1.030)
Urobilinogen, Ur: 0.2 mg/dL (ref 0.2–1.0)
pH, UA: 5.5 (ref 5.0–7.5)

## 2021-12-26 LAB — MICROSCOPIC EXAMINATION: Renal Epithel, UA: NONE SEEN /HPF

## 2021-12-26 MED ORDER — ACETAMINOPHEN ER 650 MG PO TBCR: 650.0000 mg | EXTENDED_RELEASE_TABLET | Freq: Three times a day (TID) | ORAL | 0 refills | Status: AC | PRN

## 2021-12-26 MED ORDER — PREDNISONE 10 MG PO TABS: 10.0000 mg | ORAL_TABLET | Freq: Every day | ORAL | 0 refills | Status: DC

## 2021-12-26 MED ORDER — CEPHALEXIN 500 MG PO CAPS: 500.0000 mg | ORAL_CAPSULE | Freq: Two times a day (BID) | ORAL | 0 refills | Status: DC

## 2021-12-26 NOTE — Patient Instructions (Signed)
Dysuria Dysuria is pain or discomfort during urination. The pain or discomfort may be felt in the part of the body that drains urine from the bladder (urethra) or in the surrounding tissue of the genitals. The pain may also be felt in the groin area, lower abdomen, or lower back. You may have to urinate frequently or have the sudden feeling that you have to urinate (urgency). Dysuria can affect anyone, but it is more common in females. Dysuria can be caused by many different things, including: Urinary tract infection. Kidney stones or bladder stones. Certain STIs (sexually transmitted infections), such as chlamydia. Dehydration. Inflammation of the tissues of the vagina. Use of certain medicines. Use of certain soaps or scented products that cause irritation. Follow these instructions at home: Medicines Take over-the-counter and prescription medicines only as told by your health care provider. If you were prescribed an antibiotic medicine, take it as told by your health care provider. Do not stop taking the antibiotic even if you start to feel better. Eating and drinking  Drink enough fluid to keep your urine pale yellow. Avoid caffeinated beverages, tea, and alcohol. These beverages can irritate the bladder and make dysuria worse. In males, alcohol may irritate the prostate. General instructions Watch your condition for any changes. Urinate often. Avoid holding urine for long periods of time. If you are female, you should wipe from front to back after urinating or having a bowel movement. Use each piece of toilet paper only once. Empty your bladder after sex. Keep all follow-up visits. This is important. If you had any tests done to find the cause of dysuria, it is up to you to get your test results. Ask your health care provider, or the department that is doing the test, when your results will be ready. Contact a health care provider if: You have a fever. You develop pain in your back or  sides. You have nausea or vomiting. You have blood in your urine. You are not urinating as often as you usually do. Get help right away if: Your pain is severe and not relieved with medicines. You cannot eat or drink without vomiting. You are confused. You have a rapid heartbeat while resting. You have shaking or chills. You feel extremely weak. Summary Dysuria is pain or discomfort while urinating. Many different conditions can lead to dysuria. If you have dysuria, you may have to urinate frequently or have the sudden feeling that you have to urinate (urgency). Watch your condition for any changes. Keep all follow-up visits. Make sure that you urinate often and drink enough fluid to keep your urine pale yellow. This information is not intended to replace advice given to you by your health care provider. Make sure you discuss any questions you have with your health care provider. Document Revised: 11/20/2019 Document Reviewed: 11/20/2019 Elsevier Patient Education  [place]. Acute Back Pain, Adult Acute back pain is sudden and usually short-lived. It is often caused by an injury to the muscles and tissues in the back. The injury may result from: A muscle, tendon, or ligament getting overstretched or torn. Ligaments are tissues that connect bones to each other. Lifting something improperly can cause a back strain. Wear and tear (degeneration) of the spinal disks. Spinal disks are circular tissue that provide cushioning between the bones of the spine (vertebrae). Twisting motions, such as while playing sports or doing yard work. A hit to the back. Arthritis. You may have a physical exam, lab tests, and imaging tests  to find the cause of your pain. Acute back pain usually goes away with rest and home care. Follow these instructions at home: Managing pain, stiffness, and swelling Take over-the-counter and prescription medicines only as told by your health care provider. Treatment  may include medicines for pain and inflammation that are taken by mouth or applied to the skin, or muscle relaxants. Your health care provider may recommend applying ice during the first 24-48 hours after your pain starts. To do this: Put ice in a plastic bag. Place a towel between your skin and the bag. Leave the ice on for 20 minutes, 2-3 times a day. Remove the ice if your skin turns bright red. This is very important. If you cannot feel pain, heat, or cold, you have a greater risk of damage to the area. If directed, apply heat to the affected area as often as told by your health care provider. Use the heat source that your health care provider recommends, such as a moist heat pack or a heating pad. Place a towel between your skin and the heat source. Leave the heat on for 20-30 minutes. Remove the heat if your skin turns bright red. This is especially important if you are unable to feel pain, heat, or cold. You have a greater risk of getting burned. Activity  Do not stay in bed. Staying in bed for more than 1-2 days can delay your recovery. Sit up and stand up straight. Avoid leaning forward when you sit or hunching over when you stand. If you work at a desk, sit close to it so you do not need to lean over. Keep your chin tucked in. Keep your neck drawn back, and keep your elbows bent at a 90-degree angle (right angle). Sit high and close to the steering wheel when you drive. Add lower back (lumbar) support to your car seat, if needed. Take short walks on even surfaces as soon as you are able. Try to increase the length of time you walk each day. Do not sit, drive, or stand in one place for more than 30 minutes at a time. Sitting or standing for long periods of time can put stress on your back. Do not drive or use heavy machinery while taking prescription pain medicine. Use proper lifting techniques. When you bend and lift, use positions that put less stress on your back: [place] your  knees. Keep the load close to your body. Avoid twisting. Exercise regularly as told by your health care provider. Exercising helps your back heal faster and helps prevent back injuries by keeping muscles strong and flexible. Work with a physical therapist to make a safe exercise program, as recommended by your health care provider. Do any exercises as told by your physical therapist. Lifestyle Maintain a healthy weight. Extra weight puts stress on your back and makes it difficult to have good posture. Avoid activities or situations that make you feel anxious or stressed. Stress and anxiety increase muscle tension and can make back pain worse. Learn ways to manage anxiety and stress, such as through exercise. General instructions Sleep on a firm mattress in a comfortable position. Try lying on your side with your knees slightly bent. If you lie on your back, put a pillow under your knees. Keep your head and neck in a straight line with your spine (neutral position) when using electronic equipment like smartphones or pads. To do this: Raise your smartphone or pad to look at it instead of bending your head or  neck to look down. Put the smartphone or pad at the level of your face while looking at the screen. Follow your treatment plan as told by your health care provider. This may include: Cognitive or behavioral therapy. Acupuncture or massage therapy. Meditation or yoga. Contact a health care provider if: You have pain that is not relieved with rest or medicine. You have increasing pain going down into your legs or buttocks. Your pain does not improve after 2 weeks. You have pain at night. You lose weight without trying. You have a fever or chills. You develop nausea or vomiting. You develop abdominal pain. Get help right away if: You develop new bowel or bladder control problems. You have unusual weakness or numbness in your arms or legs. You feel faint. These symptoms may represent a  serious problem that is an emergency. Do not wait to see if the symptoms will go away. Get medical help right away. Call your local emergency services (911 in the [place] Do not drive yourself to the hospital. Summary Acute back pain is sudden and usually short-lived. Use proper lifting techniques. When you bend and lift, use positions that put less stress on your back. Take over-the-counter and prescription medicines only as told by your health care provider, and apply heat or ice as told. This information is not intended to replace advice given to you by your health care provider. Make sure you discuss any questions you have with your health care provider. Document Revised: 07/01/2020 Document Reviewed: 07/01/2020 Elsevier Patient Education  [place].

## 2021-12-26 NOTE — Progress Notes (Signed)
Acute Office Visit  Subjective:     Patient ID: Rhonda [name], female    DOB: Jan 26, 1933, 88 y.o.   MRN: [id number]  Chief Complaint  Patient presents with   Back Pain    Dysuria  This is a new problem. The problem occurs every urination. The problem has been gradually worsening. The quality of the pain is described as aching. The pain is moderate. There has been no fever. She is Not sexually active. Associated symptoms include flank pain. Pertinent negatives include no chills, discharge, nausea, urgency or vomiting. She has tried nothing for the symptoms.  Back Pain This is a new problem. The current episode started in the past 7 days. The problem occurs constantly. The problem has been gradually worsening since onset. The pain is present in the lumbar spine. The quality of the pain is described as aching. The pain does not radiate. The pain is moderate. The pain is The same all the time. The symptoms are aggravated by lying down and position. Stiffness is present All day. Associated symptoms include dysuria. Pertinent negatives include no numbness, perianal numbness or tingling. She has tried nothing for the symptoms.     Review of Systems  Constitutional: Negative.  Negative for chills.  HENT: Negative.    Cardiovascular: Negative.   Gastrointestinal:  Negative for nausea and vomiting.  Genitourinary:  Positive for dysuria and flank pain. Negative for urgency.  Musculoskeletal:  Positive for back pain.  Skin: Negative.  Negative for itching and rash.  Neurological:  Negative for tingling and numbness.  All other systems reviewed and are negative.       Objective:    BP 124/69   Pulse 79   Temp (!) 97.4 F (36.3 C) (Temporal)   Ht '5\' 5"'$  (1.651 m)   Wt 164 lb 2 oz (74.4 kg)   SpO2 97%   BMI 27.31 kg/m  BP Readings from Last 3 Encounters:  12/26/21 124/69  12/15/21 118/63  09/08/21 136/76   Wt Readings from Last 3 Encounters:  12/26/21 164 lb 2 oz (74.4 kg)   12/15/21 167 lb (75.8 kg)  09/08/21 164 lb (74.4 kg)      Physical Exam Vitals and nursing note reviewed.  Constitutional:      Appearance: Normal appearance.  HENT:     Head: Normocephalic.     Right Ear: External ear normal.     Left Ear: External ear normal.     Nose: Nose normal.  Eyes:     Conjunctiva/sclera: Conjunctivae normal.  Cardiovascular:     Rate and Rhythm: Normal rate and regular rhythm.     Pulses: Normal pulses.     Heart sounds: Normal heart sounds.  Pulmonary:     Effort: Pulmonary effort is normal.     Breath sounds: Normal breath sounds.  Abdominal:     Tenderness: There is right CVA tenderness and left CVA tenderness.  Musculoskeletal:     Lumbar back: Tenderness present.  Skin:    General: Skin is warm.     Findings: No erythema or rash.  Neurological:     Mental Status: She is alert and oriented to person, place, and time.  Psychiatric:        Mood and Affect: Mood normal.        Behavior: Behavior normal.     No results found for any visits on 12/26/21.      Assessment & Plan:    Appears well, in no apparent distress.  Vital signs are normal. The abdomen is soft without tenderness, guarding, mass, rebound or organomegaly. CVA tenderness present. No inguinal adenopathy noted. Urine dipstick shows positive for WBC's, positive for protein, and positive for leukocytes.  Micro exam: few+ bacteria and hyalin casts seen.   UTI uncomplicated without evidence of pyelonephritis  PLAN: Treatment per orders - also push fluids, may use Pyridium OTC prn. Call or return to clinic prn if these symptoms worsen or fail to improve as anticipated.   Problem List Items Addressed This Visit   None Visit Diagnoses     Flank pain    -  Primary   Relevant Medications   cephALEXin (KEFLEX) 500 MG capsule   Other Relevant Orders   Urinalysis, Routine w reflex microscopic   Urine Culture   Back pain without sciatica       Relevant Medications   predniSONE  (DELTASONE) 10 MG tablet   acetaminophen (TYLENOL) 650 MG CR tablet   Abnormal urine       Relevant Medications   cephALEXin (KEFLEX) 500 MG capsule   Other Relevant Orders   Urine Culture       Meds ordered this encounter  Medications   predniSONE (DELTASONE) 10 MG tablet    Sig: Take 1 tablet (10 mg total) by mouth daily with breakfast.    Dispense:  6 tablet    Refill:  0    Order Specific Question:   Supervising Provider    Answer:   Jeneen Rinks   acetaminophen (TYLENOL) 650 MG CR tablet    Sig: Take 1 tablet (650 mg total) by mouth every 8 (eight) hours as needed for pain.    Dispense:  30 tablet    Refill:  0    Order Specific Question:   Supervising Provider    Answer:   Claretta Fraise [[id number]]   cephALEXin (KEFLEX) 500 MG capsule    Sig: Take 1 capsule (500 mg total) by mouth 2 (two) times daily.    Dispense:  14 capsule    Refill:  0    Order Specific Question:   Supervising Provider    Answer:   Claretta Fraise [[id number]]    Return if symptoms worsen or fail to improve.  Ivy Lynn, NP

## 2021-12-28 LAB — URINE CULTURE

## 2021-12-29 ENCOUNTER — Telehealth: Payer: Self-pay | Admitting: Family Medicine

## 2021-12-29 NOTE — Telephone Encounter (Signed)
Pt wants someone to call her with her urine culture results today.

## 2021-12-29 NOTE — Telephone Encounter (Signed)
Check result note

## 2022-01-01 DIAGNOSIS — H40033 Anatomical narrow angle, bilateral: Secondary | ICD-10-CM | POA: Diagnosis not present

## 2022-01-01 DIAGNOSIS — H2513 Age-related nuclear cataract, bilateral: Secondary | ICD-10-CM | POA: Diagnosis not present

## 2022-01-01 NOTE — Telephone Encounter (Signed)
Pt aware of results 

## 2022-01-01 NOTE — Telephone Encounter (Signed)
Patient wants urine culture results. Please call.

## 2022-01-02 ENCOUNTER — Telehealth: Payer: Self-pay | Admitting: Family Medicine

## 2022-01-02 NOTE — Telephone Encounter (Signed)
Patient was seen for UTI, If she has back pain she can make an appointment to be evaluated for back pain since they are not the same complaint. Thank you and let me know how I can assist you further.

## 2022-01-02 NOTE — Telephone Encounter (Signed)
Pt called and scheduled

## 2022-01-03 ENCOUNTER — Encounter: Payer: Self-pay | Admitting: Nurse Practitioner

## 2022-01-03 ENCOUNTER — Ambulatory Visit (INDEPENDENT_AMBULATORY_CARE_PROVIDER_SITE_OTHER): Payer: PPO | Admitting: Nurse Practitioner

## 2022-01-03 ENCOUNTER — Ambulatory Visit (INDEPENDENT_AMBULATORY_CARE_PROVIDER_SITE_OTHER): Payer: PPO

## 2022-01-03 VITALS — BP 136/80 | HR 79 | Temp 98.6°F | Ht 65.0 in | Wt 164.0 lb

## 2022-01-03 DIAGNOSIS — M5489 Other dorsalgia: Secondary | ICD-10-CM

## 2022-01-03 DIAGNOSIS — M545 Low back pain, unspecified: Secondary | ICD-10-CM | POA: Diagnosis not present

## 2022-01-03 MED ORDER — METHYLPREDNISOLONE ACETATE 40 MG/ML IJ SUSP
40.0000 mg | Freq: Once | INTRAMUSCULAR | Status: AC
  Administered 2022-01-03: 40 mg via INTRAMUSCULAR

## 2022-01-03 NOTE — Patient Instructions (Signed)
Acute Back Pain, Adult Acute back pain is sudden and usually short-lived. It is often caused by an injury to the muscles and tissues in the back. The injury may result from: A muscle, tendon, or ligament getting overstretched or torn. Ligaments are tissues that connect bones to each other. Lifting something improperly can cause a back strain. Wear and tear (degeneration) of the spinal disks. Spinal disks are circular tissue that provide cushioning between the bones of the spine (vertebrae). Twisting motions, such as while playing sports or doing yard work. A hit to the back. Arthritis. You may have a physical exam, lab tests, and imaging tests to find the cause of your pain. Acute back pain usually goes away with rest and home care. Follow these instructions at home: Managing pain, stiffness, and swelling Take over-the-counter and prescription medicines only as told by your health care provider. Treatment may include medicines for pain and inflammation that are taken by mouth or applied to the skin, or muscle relaxants. Your health care provider may recommend applying ice during the first 24-48 hours after your pain starts. To do this: Put ice in a plastic bag. Place a towel between your skin and the bag. Leave the ice on for 20 minutes, 2-3 times a day. Remove the ice if your skin turns bright red. This is very important. If you cannot feel pain, heat, or cold, you have a greater risk of damage to the area. If directed, apply heat to the affected area as often as told by your health care provider. Use the heat source that your health care provider recommends, such as a moist heat pack or a heating pad. Place a towel between your skin and the heat source. Leave the heat on for 20-30 minutes. Remove the heat if your skin turns bright red. This is especially important if you are unable to feel pain, heat, or cold. You have a greater risk of getting burned. Activity  Do not stay in bed. Staying in  bed for more than 1-2 days can delay your recovery. Sit up and stand up straight. Avoid leaning forward when you sit or hunching over when you stand. If you work at a desk, sit close to it so you do not need to lean over. Keep your chin tucked in. Keep your neck drawn back, and keep your elbows bent at a 90-degree angle (right angle). Sit high and close to the steering wheel when you drive. Add lower back (lumbar) support to your car seat, if needed. Take short walks on even surfaces as soon as you are able. Try to increase the length of time you walk each day. Do not sit, drive, or stand in one place for more than 30 minutes at a time. Sitting or standing for long periods of time can put stress on your back. Do not drive or use heavy machinery while taking prescription pain medicine. Use proper lifting techniques. When you bend and lift, use positions that put less stress on your back: Bend your knees. Keep the load close to your body. Avoid twisting. Exercise regularly as told by your health care provider. Exercising helps your back heal faster and helps prevent back injuries by keeping muscles strong and flexible. Work with a physical therapist to make a safe exercise program, as recommended by your health care provider. Do any exercises as told by your physical therapist. Lifestyle Maintain a healthy weight. Extra weight puts stress on your back and makes it difficult to have good   posture. Avoid activities or situations that make you feel anxious or stressed. Stress and anxiety increase muscle tension and can make back pain worse. Learn ways to manage anxiety and stress, such as through exercise. General instructions Sleep on a firm mattress in a comfortable position. Try lying on your side with your knees slightly bent. If you lie on your back, put a pillow under your knees. Keep your head and neck in a straight line with your spine (neutral position) when using electronic equipment like  smartphones or pads. To do this: Raise your smartphone or pad to look at it instead of bending your head or neck to look down. Put the smartphone or pad at the level of your face while looking at the screen. Follow your treatment plan as told by your health care provider. This may include: Cognitive or behavioral therapy. Acupuncture or massage therapy. Meditation or yoga. Contact a health care provider if: You have pain that is not relieved with rest or medicine. You have increasing pain going down into your legs or buttocks. Your pain does not improve after 2 weeks. You have pain at night. You lose weight without trying. You have a fever or chills. You develop nausea or vomiting. You develop abdominal pain. Get help right away if: You develop new bowel or bladder control problems. You have unusual weakness or numbness in your arms or legs. You feel faint. These symptoms may represent a serious problem that is an emergency. Do not wait to see if the symptoms will go away. Get medical help right away. Call your local emergency services (911 in the [place] Do not drive yourself to the hospital. Summary Acute back pain is sudden and usually short-lived. Use proper lifting techniques. When you bend and lift, use positions that put less stress on your back. Take over-the-counter and prescription medicines only as told by your health care provider, and apply heat or ice as told. This information is not intended to replace advice given to you by your health care provider. Make sure you discuss any questions you have with your health care provider. Document Revised: 07/01/2020 Document Reviewed: 07/01/2020 Elsevier Patient Education  2023 [employer] Inc[employer]  

## 2022-01-03 NOTE — Progress Notes (Signed)
Acute Office Visit  Subjective:     Patient ID: Rhonda [name], female    DOB: 1932-10-27, 88 y.o.   MRN: [id number]  Chief Complaint  Patient presents with   Back Pain    This has been going on for almost a month now     Back Pain This is a new problem. The current episode started in the past 7 days. The problem occurs constantly. The problem is unchanged. The pain is present in the lumbar spine. The pain does not radiate. The pain is at a severity of 9/10. The pain is severe. The symptoms are aggravated by bending and position. Stiffness is present All day. Pertinent negatives include no bladder incontinence, fever, headaches, numbness, paresis, pelvic pain or weakness. She has tried nothing for the symptoms.     Review of Systems  Constitutional: Negative.  Negative for chills and fever.  HENT: Negative.    Eyes: Negative.   Respiratory: Negative.    Genitourinary:  Negative for bladder incontinence and pelvic pain.  Musculoskeletal:  Positive for back pain.  Skin: Negative.  Negative for itching and rash.  Neurological:  Negative for weakness, numbness and headaches.  All other systems reviewed and are negative.       Objective:    BP 136/80   Pulse 79   Temp 98.6 F (37 C)   Ht '5\' 5"'$  (1.651 m)   Wt 164 lb (74.4 kg)   SpO2 96%   BMI 27.29 kg/m    Physical Exam Vitals and nursing note reviewed.  Constitutional:      Appearance: Normal appearance.  HENT:     Head: Normocephalic.     Right Ear: External ear normal.     Left Ear: External ear normal.     Nose: Nose normal.     Mouth/Throat:     Mouth: Mucous membranes are moist.     Pharynx: Oropharynx is clear.  Eyes:     Conjunctiva/sclera: Conjunctivae normal.  Cardiovascular:     Rate and Rhythm: Normal rate and regular rhythm.     Pulses: Normal pulses.     Heart sounds: Normal heart sounds.  Pulmonary:     Effort: Pulmonary effort is normal.     Breath sounds: Normal breath sounds.   Abdominal:     General: Bowel sounds are normal.  Musculoskeletal:     Lumbar back: Tenderness present. No swelling, deformity, lacerations or spasms. Decreased range of motion.  Skin:    General: Skin is warm.     Findings: No erythema or rash.  Neurological:     Mental Status: She is alert and oriented to person, place, and time.  Psychiatric:        Behavior: Behavior normal.     No results found for any visits on 01/03/22.      Assessment & Plan:  Back pain unresolved, completed lower back x-ray results pending.  Continue heating pad, Voltaren gel as prescribed.  Educated patient on proper back mechanics when lifting and bending.  Depo-Medrol shot given in clinic follow-up for worsening unresolved symptoms. Problem List Items Addressed This Visit   None Visit Diagnoses     Back pain without sciatica    -  Primary   Relevant Medications   methylPREDNISolone acetate (DEPO-MEDROL) injection 40 mg (Completed)   Other Relevant Orders   DG Lumbar Spine 2-3 Views       Meds ordered this encounter  Medications   methylPREDNISolone acetate (DEPO-MEDROL) injection 40  mg    Return if symptoms worsen or fail to improve, for back pain.  Ivy Lynn, NP

## 2022-01-08 ENCOUNTER — Telehealth: Payer: Self-pay | Admitting: Family Medicine

## 2022-01-08 NOTE — Telephone Encounter (Signed)
Called and notified patient of results.

## 2022-01-10 ENCOUNTER — Ambulatory Visit
Admission: RE | Admit: 2022-01-10 | Discharge: 2022-01-10 | Disposition: A | Discharge status: Home / Self Care | Payer: PPO | Source: Ambulatory Visit | Attending: Family Medicine | Admitting: Family Medicine

## 2022-01-10 DIAGNOSIS — Z1231 Encounter for screening mammogram for malignant neoplasm of breast: Secondary | ICD-10-CM

## 2022-01-16 NOTE — Progress Notes (Unsigned)
Cardiology Office Note   Date:  01/17/2022   ID:  [name], [name] [birth year], MRN [id number]  PCP:  Dettinger, Fransisca Kaufmann, MD  Cardiologist:   [name], MD    Chief Complaint  Patient presents with   Palpitations       History of Present Illness: Rhonda [name] is a 88 y.o. female who with atrial fib.  She was in the hospital in Jan with this.  She was treated with flecainide, Eliquis and Diltiazem.   In Feb she had a POET ([employer] Exercise Treadmill). In late June 2020 she was in the ED with SVT. She was treated with adenosine.   She has had lower extremity swelling.  She had no evidence of a DVT.  I treated her with Lasix.       She called in July with palpitations.  She was feeling like her heart was beating in the 30s and 40s by her monitor.  She does not have a wearable to tell us what the rhythm was.  I have suggested that she wear a monitor but she did not want to if she has done this before.  She said it went on for few weeks and then disappeared and she has not had that rhythm since then.  She said when it was happening she just "felt funny."  She was not having by her report presyncope and she did not have any syncope.  She was not having any chest pressure, neck or arm discomfort.  She was not having any shortness of breath, PND or orthopnea.  It was coming out of the blue.  Past Medical History:  Diagnosis Date   Arthritis    Atrial fibrillation ([place]) 05/2015   Dysrhythmia    afib   Hypertension    Osteopenia    Syncope 04/2014    Past Surgical History:  Procedure Laterality Date   BREAST EXCISIONAL BIOPSY Left    BREAST EXCISIONAL BIOPSY Right    COLONOSCOPY  2011   KNEE ARTHROCENTESIS Bilateral    KNEE ARTHROSCOPY     bil   POLYPECTOMY  11-01-2009   ROTATOR CUFF REPAIR     SHOULDER SURGERY Left    TOTAL KNEE ARTHROPLASTY Left    TOTAL KNEE ARTHROPLASTY Right 10/12/2019   Procedure: TOTAL KNEE ARTHROPLASTY;  Surgeon: Gaynelle Arabian, MD;   Location: [facility];  Service: Orthopedics;  Laterality: Right;  59mn   VAGINAL HYSTERECTOMY     twice - vaginal and then oopherectomy     Current Outpatient Medications  Medication Sig Dispense Refill   acetaminophen (TYLENOL) 650 MG CR tablet Take 1 tablet (650 mg total) by mouth every 8 (eight) hours as needed for pain. (Patient taking differently: Take 1,300 mg by mouth every 8 (eight) hours as needed for pain.) 30 tablet 0   cholecalciferol (VITAMIN D3) 25 MCG (1000 UT) tablet Take 2,000 Units by mouth 2 (two) times daily.     diclofenac Sodium (VOLTAREN) 1 % GEL Apply 2 g topically 4 (four) times daily. 350 g 5   diltiazem (TIAZAC) 360 MG 24 hr capsule TAKE ONE (1) CAPSULE EACH DAY 90 capsule 3   ELIQUIS 5 MG TABS tablet TAKE ONE TABLET BY MOUTH TWICE DAILY 180 tablet 1   flecainide (TAMBOCOR) 50 MG tablet TAKE ONE (1) TABLET BY MOUTH TWO (2) TIMES DAILY 180 tablet 0   zolpidem (AMBIEN) 5 MG tablet Take 1 tablet (5 mg total) by mouth at bedtime as  needed for sleep. Put on file until patient asked for refill 30 tablet 5   No current facility-administered medications for this visit.    Allergies:   Patient has no known allergies.    ROS:  Please see the history of present illness.   Otherwise, review of systems are positive for none.   All other systems are reviewed and negative.    PHYSICAL EXAM: VS:  BP 130/74   Pulse 67   Ht '5\' 5"'$  (1.651 m)   Wt 159 lb (72.1 kg)   BMI 26.46 kg/m  , BMI Body mass index is 26.46 kg/m. GENERAL:  Well appearing NECK:  No jugular venous distention, waveform within normal limits, carotid upstroke brisk and symmetric, no bruits, no thyromegaly LUNGS:  Clear to auscultation bilaterally CHEST:  Unremarkable HEART:  PMI not displaced or sustained,S1 and S2 within normal limits, no S3, no clicks, no rubs, no murmurs, irregular  ABD:  Flat, positive bowel sounds normal in frequency in pitch, no bruits, no rebound, no guarding, no midline pulsatile  mass, no hepatomegaly, no splenomegaly EXT:  2 plus pulses throughout, no edema, no cyanosis no clubbing   EKG:  EKG is  ordered today. Sinus rhythm, rate 67, leftward axis, intervals within normal limits, no acute ST-T wave changes.  Few premature contractions.    Recent Labs: 09/08/2021: ALT 12; BUN 16; Creatinine, Ser 0.85; Hemoglobin 14.5; Platelets 298; Potassium 4.6; Sodium 138    Lipid Panel    Component Value Date/Time   CHOL 174 09/08/2021 1057   TRIG 127 09/08/2021 1057   HDL 51 09/08/2021 1057   CHOLHDL 3.4 09/08/2021 1057   LDLCALC 100 (H) 09/08/2021 1057      Wt Readings from Last 3 Encounters:  01/17/22 159 lb (72.1 kg)  01/03/22 164 lb (74.4 kg)  12/26/21 164 lb 2 oz (74.4 kg)      Other studies Reviewed: Additional studies/ records that were reviewed today include:   Labs. Review of the above records demonstrates: See elsewhere   ASSESSMENT AND PLAN:  Persistent atrial fibrillation:   She is tolerating anticoagulation.  CHA2DS2-VASc is 4.  No change in therapy.   Palpitations: It is difficult to know what she was experiencing.  She may have been under counting the PACs with her monitor.  I told her that if this happens again we would need to try to record it somehow and we discussed current data technologies.  She would not probably be able to operate her own wearable.  She will let us know if she is having the symptoms we will try to get an EKG for monitoring strip somewhere.  For now no change in therapy.  Hypertension: The blood pressure is at target.  No change in therapy.  SVT: She is not having any symptomatic tachypalpitations.  No change in therapy.  Swelling:    She has no increased swelling.  She is not wearing her compression stockings.   Current medicines are reviewed at length with the patient today.  The patient does not have concerns regarding medicines.  The following changes have been made: None  Labs/ tests ordered today include:  None  Orders Placed This Encounter  Procedures   EKG 12-Lead      Disposition:   FU with me in 12 months.    Signed, [name], MD  01/17/2022 2:17 PM    [facility]

## 2022-01-17 ENCOUNTER — Encounter: Payer: Self-pay | Admitting: Cardiology

## 2022-01-17 ENCOUNTER — Ambulatory Visit: Payer: PPO | Admitting: Cardiology

## 2022-01-17 VITALS — BP 130/74 | HR 67 | Ht 65.0 in | Wt 159.0 lb

## 2022-01-17 DIAGNOSIS — I4819 Other persistent atrial fibrillation: Secondary | ICD-10-CM

## 2022-01-17 DIAGNOSIS — I10 Essential (primary) hypertension: Secondary | ICD-10-CM | POA: Diagnosis not present

## 2022-01-17 DIAGNOSIS — I471 Supraventricular tachycardia: Secondary | ICD-10-CM | POA: Diagnosis not present

## 2022-01-17 DIAGNOSIS — M7989 Other specified soft tissue disorders: Secondary | ICD-10-CM | POA: Diagnosis not present

## 2022-01-17 NOTE — Patient Instructions (Signed)
Medication Instructions:  The current medical regimen is effective;  continue present plan and medications.  *If you need a refill on your cardiac medications before your next appointment, please call your pharmacy*  Follow-Up: At [facility], you and your health needs are our priority.  As part of our continuing mission to provide you with exceptional heart care, we have created designated Provider Care Teams.  These Care Teams include your primary Cardiologist (physician) and Advanced Practice Providers (APPs -  Physician Assistants and Nurse Practitioners) who all work together to provide you with the care you need, when you need it.  We recommend signing up for the patient portal called "MyChart".  Sign up information is provided on this After Visit Summary.  MyChart is used to connect with patients for Virtual Visits (Telemedicine).  Patients are able to view lab/test results, encounter notes, upcoming appointments, etc.  Non-urgent messages can be sent to your provider as well.   To learn more about what you can do with MyChart, go to [email or web address].    Your next appointment:   1 year(s)  The format for your next appointment:   In Person  Provider:   James Hochrein, MD     Important Information About Sugar       

## 2022-01-22 ENCOUNTER — Other Ambulatory Visit: Payer: Self-pay | Admitting: *Deleted

## 2022-01-22 DIAGNOSIS — H25812 Combined forms of age-related cataract, left eye: Secondary | ICD-10-CM | POA: Diagnosis not present

## 2022-01-22 DIAGNOSIS — H353131 Nonexudative age-related macular degeneration, bilateral, early dry stage: Secondary | ICD-10-CM | POA: Diagnosis not present

## 2022-01-22 NOTE — Patient Outreach (Signed)
  Care Coordination   01/22/2022  Name: Rhonda [name] MRN: [id number] DOB: Feb 27, 1933   Care Coordination Outreach Attempts:  A second unsuccessful outreach was attempted today to offer the patient with information about available care coordination services as a benefit of their health plan.   HIPAA compliant messages left on voicemail, providing contact information for CSW, encouraging patient to return CSW's call at her earliest convenience.  Follow Up Plan:  Additional outreach attempts will be made to offer the patient care coordination information and services.   Encounter Outcome:  No Answer.   Care Coordination Interventions Activated:  No.    Care Coordination Interventions:  No, not indicated.    Nat Christen, BSW, MSW, LCSW  Licensed Education officer, [facility]  Mailing [address], [place], [place] [address] Physical Address-[address] [place], [place] [address] Toll Free Main # [phone] Fax # [phone] Cell # [phone] [email or web address]

## 2022-01-23 ENCOUNTER — Encounter: Payer: Self-pay | Admitting: Family Medicine

## 2022-01-23 ENCOUNTER — Ambulatory Visit (INDEPENDENT_AMBULATORY_CARE_PROVIDER_SITE_OTHER): Payer: PPO | Admitting: Family Medicine

## 2022-01-23 VITALS — BP 109/64 | HR 65 | Temp 98.4°F | Ht 65.0 in | Wt 163.0 lb

## 2022-01-23 DIAGNOSIS — R300 Dysuria: Secondary | ICD-10-CM

## 2022-01-23 DIAGNOSIS — N3001 Acute cystitis with hematuria: Secondary | ICD-10-CM

## 2022-01-23 LAB — URINALYSIS, ROUTINE W REFLEX MICROSCOPIC
Bilirubin, UA: NEGATIVE
Glucose, UA: NEGATIVE
Ketones, UA: NEGATIVE
Nitrite, UA: NEGATIVE
Specific Gravity, UA: 1.02 (ref 1.005–1.030)
Urobilinogen, Ur: 0.2 mg/dL (ref 0.2–1.0)
pH, UA: 5 (ref 5.0–7.5)

## 2022-01-23 LAB — MICROSCOPIC EXAMINATION
RBC, Urine: >30 /hpf — AB (ref 0–2)
Renal Epithel, UA: NONE SEEN /hpf
WBC, UA: >30 /hpf — AB (ref 0–5)

## 2022-01-23 MED ORDER — AMOXICILLIN-POT CLAVULANATE 875-125 MG PO TABS: 1.0000 | ORAL_TABLET | Freq: Two times a day (BID) | ORAL | 0 refills | Status: AC

## 2022-01-23 NOTE — Progress Notes (Signed)
Subjective:  Patient ID: Rhonda [name], female    DOB: [birth year], 88 y.o.   MRN: [id number]  Patient Care Team: Dettinger, Fransisca Kaufmann, MD as PCP - General (Family Medicine) [name], MD as PCP - Cardiology (Cardiology) [name], MD as Consulting Physician (Cardiology) Latanya Maudlin, MD as Consulting Physician (Orthopedic Surgery) Saporito, Maree Erie, LCSW as Social Worker (Licensed Clinical Social Worker)   Chief Complaint:  Dysuria (Pt states she has been having these sxs for a while now )   HPI: Rhonda [name] is a 88 y.o. female presenting on 01/23/2022 for Dysuria (Pt states she has been having these sxs for a while now )   Dysuria  This is a new problem. Episode onset: Saturday. The problem occurs every urination. The problem has been gradually worsening. The quality of the pain is described as aching and burning. The pain is mild. There has been no fever. She is Not sexually active. There is No history of pyelonephritis. Associated symptoms include frequency and urgency. Pertinent negatives include no chills, discharge, flank pain, hematuria, hesitancy, nausea, possible pregnancy, sweats or vomiting. She has tried increased fluids for the symptoms. The treatment provided no relief.    Relevant past medical, surgical, family, and social history reviewed and updated as indicated.  Allergies and medications reviewed and updated. Data reviewed: Chart in Epic.   Past Medical History:  Diagnosis Date   Arthritis    Atrial fibrillation ([place]) 05/2015   Dysrhythmia    afib   Hypertension    Osteopenia    Syncope 04/2014    Past Surgical History:  Procedure Laterality Date   BREAST EXCISIONAL BIOPSY Left    BREAST EXCISIONAL BIOPSY Right    COLONOSCOPY  2011   KNEE ARTHROCENTESIS Bilateral    KNEE ARTHROSCOPY     bil   POLYPECTOMY  11-01-2009   ROTATOR CUFF REPAIR     SHOULDER SURGERY Left    TOTAL KNEE ARTHROPLASTY Left    TOTAL KNEE ARTHROPLASTY  Right 10/12/2019   Procedure: TOTAL KNEE ARTHROPLASTY;  Surgeon: Gaynelle Arabian, MD;  Location: [facility];  Service: Orthopedics;  Laterality: Right;  9mn   VAGINAL HYSTERECTOMY     twice - vaginal and then oopherectomy    Social History   Socioeconomic History   Marital status: Widowed    Spouse name: Not on file   Number of children: 1   Years of education: 12   Highest education level: High school graduate  Occupational History   Occupation: retired  Tobacco Use   Smoking status: Never   Smokeless tobacco: Never  Vaping Use   Vaping Use: Never used  Substance and Sexual Activity   Alcohol use: No    Alcohol/week: 0.0 standard drinks of alcohol   Drug use: No   Sexual activity: Not Currently  Other Topics Concern   Not on file  Social History Narrative     Son lives 20 minutes away   Social Determinants of Health   Financial Resource Strain: Low Risk  (02/17/2021)   Overall Financial Resource Strain (CARDIA)    Difficulty of Paying Living Expenses: Not hard at all  Food Insecurity: No Food Insecurity (02/17/2021)   Hunger Vital Sign    Worried About Running Out of Food in the Last Year: Never true    [place] the [place]: Never true  Transportation Needs: No Transportation Needs (02/17/2021)   PRAPARE - Transportation    Lack of  Transportation (Medical): No    Lack of Transportation (Non-Medical): No  Physical Activity: Sufficiently Active (02/17/2021)   Exercise Vital Sign    Days of Exercise per Week: 7 days    Minutes of Exercise per Session: 30 min  Stress: No Stress Concern Present (02/17/2021)   [place]    Feeling of Stress : Not at all  Social Connections: Moderately Integrated (02/17/2021)   Social Connection and Isolation Panel [NHANES]    Frequency of Communication with Friends and Family: More than three times a week    Frequency of Social Gatherings with Friends and  Family: More than three times a week    Attends Religious Services: More than 4 times per year    Active Member of [employer] or Organizations: Yes    Attends Archivist Meetings: More than 4 times per year    Marital Status: Widowed  Intimate Partner Violence: Not At Risk (02/17/2021)   Humiliation, Afraid, Rape, and Kick questionnaire    Fear of Current or Ex-Partner: No    Emotionally Abused: No    Physically Abused: No    Sexually Abused: No    Outpatient Encounter Medications as of 01/23/2022  Medication Sig   acetaminophen (TYLENOL) 650 MG CR tablet Take 1 tablet (650 mg total) by mouth every 8 (eight) hours as needed for pain. (Patient taking differently: Take 1,300 mg by mouth every 8 (eight) hours as needed for pain.)   amoxicillin-clavulanate (AUGMENTIN) 875-125 MG tablet Take 1 tablet by mouth 2 (two) times daily for 10 days.   cholecalciferol (VITAMIN D3) 25 MCG (1000 UT) tablet Take 2,000 Units by mouth 2 (two) times daily.   diclofenac Sodium (VOLTAREN) 1 % GEL Apply 2 g topically 4 (four) times daily.   diltiazem (TIAZAC) 360 MG 24 hr capsule TAKE ONE (1) CAPSULE EACH DAY   ELIQUIS 5 MG TABS tablet TAKE ONE TABLET BY MOUTH TWICE DAILY   flecainide (TAMBOCOR) 50 MG tablet TAKE ONE (1) TABLET BY MOUTH TWO (2) TIMES DAILY   zolpidem (AMBIEN) 5 MG tablet Take 1 tablet (5 mg total) by mouth at bedtime as needed for sleep. Put on file until patient asked for refill   No facility-administered encounter medications on file as of 01/23/2022.    No Known Allergies  Review of Systems  Constitutional:  Negative for activity change, appetite change, chills, diaphoresis, fatigue, fever and unexpected weight change.  Eyes:  Negative for visual disturbance.  Respiratory:  Negative for cough and shortness of breath.   Cardiovascular:  Negative for chest pain, palpitations and leg swelling.  Gastrointestinal:  Negative for abdominal pain, nausea and vomiting.  Genitourinary:   Positive for dysuria, frequency and urgency. Negative for decreased urine volume, difficulty urinating, dyspareunia, enuresis, flank pain, genital sores, hematuria, hesitancy, pelvic pain, vaginal bleeding, vaginal discharge and vaginal pain.  Neurological:  Negative for dizziness, weakness, light-headedness and headaches.  Psychiatric/Behavioral:  Negative for confusion.   All other systems reviewed and are negative.       Objective:  BP 109/64   Pulse 65   Temp 98.4 F (36.9 C)   Ht '5\' 5"'$  (1.651 m)   Wt 163 lb (73.9 kg)   SpO2 94%   BMI 27.12 kg/m    Wt Readings from Last 3 Encounters:  01/23/22 163 lb (73.9 kg)  01/17/22 159 lb (72.1 kg)  01/03/22 164 lb (74.4 kg)    Physical Exam Vitals and nursing note  reviewed.  Constitutional:      General: She is not in acute distress.    Appearance: Normal appearance. She is not ill-appearing, toxic-appearing or diaphoretic.  HENT:     Head: Normocephalic and atraumatic.     Mouth/Throat:     Mouth: Mucous membranes are moist.  Eyes:     Pupils: Pupils are equal, round, and reactive to light.  Cardiovascular:     Rate and Rhythm: Normal rate. Rhythm irregular.  Pulmonary:     Effort: Pulmonary effort is normal.     Breath sounds: Normal breath sounds.  Abdominal:     General: Bowel sounds are normal. There is no distension.     Palpations: Abdomen is soft. There is no mass.     Tenderness: There is no abdominal tenderness. There is no right CVA tenderness, left CVA tenderness, guarding or rebound.     Hernia: No hernia is present.  Musculoskeletal:     Right lower leg: No edema.     Left lower leg: No edema.  Skin:    General: Skin is warm and dry.     Capillary Refill: Capillary refill takes less than 2 seconds.  Neurological:     General: No focal deficit present.     Mental Status: She is alert and oriented to person, place, and time.  Psychiatric:        Mood and Affect: Mood normal.        Behavior: Behavior  normal.        Thought Content: Thought content normal.        Judgment: Judgment normal.     Results for orders placed or performed in visit on 12/26/21  Urine Culture   Specimen: Urine   UR  Result Value Ref Range   Urine Culture, Routine Final report    Organism ID, Bacteria Comment   Microscopic Examination   Urine  Result Value Ref Range   WBC, UA 0-5 0 - 5 /hpf   RBC, Urine 0-2 0 - 2 /hpf   Epithelial Cells (non renal) 0-10 0 - 10 /hpf   Renal Epithel, UA None seen None seen /hpf   Casts Present (A) None seen /lpf   Cast Type Hyaline casts N/A   Bacteria, UA Few (A) None seen/Few  Urinalysis, Routine w reflex microscopic  Result Value Ref Range   Specific Gravity, UA 1.020 1.005 - 1.030   pH, UA 5.5 5.0 - 7.5   Color, UA Yellow Yellow   Appearance Ur Clear Clear   Leukocytes,UA Trace (A) Negative   Protein,UA Trace (A) Negative/Trace   Glucose, UA Negative Negative   Ketones, UA Trace (A) Negative   RBC, UA 2+ (A) Negative   Bilirubin, UA Negative Negative   Urobilinogen, Ur 0.2 0.2 - 1.0 mg/dL   Nitrite, UA Negative Negative   Microscopic Examination See below:        Pertinent labs & imaging results that were available during my care of the patient were reviewed by me and considered in my medical decision making.  Assessment & Plan:  Rhonda [name] was seen today for dysuria.  Diagnoses and all orders for this visit:  Dysuria Urinalysis in office with blood, leukocytes, protein, and bacteria. Culture pending. Previous urine culture results reviewed.  -     Urinalysis, Routine w reflex microscopic -     Urine Culture  Acute cystitis with hematuria Urinalysis consistent with cystitis. Previous culture reviewed and antibiotic therapy based on those results. No indications  of acute pyelonephritis or urosepsis. Medications as prescribed. Repeat urinalysis and culture in 2 weeks. Symptomatic care discussed in detail. Report new, worsening, or persistent symptoms.  -      amoxicillin-clavulanate (AUGMENTIN) 875-125 MG tablet; Take 1 tablet by mouth 2 (two) times daily for 10 days.     Continue all other maintenance medications.  Follow up plan: Return in about 2 weeks (around 02/06/2022), or if symptoms worsen or fail to improve, for repeat urinalysis with culture.   Continue healthy lifestyle choices, including diet (rich in fruits, vegetables, and lean proteins, and low in salt and simple carbohydrates) and exercise (at least 30 minutes of moderate physical activity daily).  Educational handout given for UTI  The above assessment and management plan was discussed with the patient. The patient verbalized understanding of and has agreed to the management plan. Patient is aware to call the clinic if they develop any new symptoms or if symptoms persist or worsen. Patient is aware when to return to the clinic for a follow-up visit. Patient educated on when it is appropriate to go to the emergency department.   Monia Pouch, FNP-C [facility] [phone]

## 2022-01-26 LAB — URINE CULTURE

## 2022-01-29 ENCOUNTER — Other Ambulatory Visit: Payer: Self-pay | Admitting: *Deleted

## 2022-01-29 NOTE — Patient Outreach (Signed)
  Care Coordination   01/29/2022  Name: Rhonda [name] MRN: [id number] DOB: 1932-09-26   Care Coordination Outreach Attempts:  A third unsuccessful outreach was attempted today to offer the patient with information about available care coordination services as a benefit of their health plan. HIPAA compliant messages left on voicemail, providing contact information for CSW, encouraging patient to return CSW's call at her earliest convenience.  Follow Up Plan:  No further outreach attempts will be made at this time. We have been unable to contact the patient to offer or enroll patient in care coordination services.  Encounter Outcome:  No Answer.   Care Coordination Interventions Activated:  No.    Care Coordination Interventions:  No, not indicated.    Nat Christen, BSW, MSW, LCSW  Licensed Education officer, [facility]  Mailing [address], [place], [place] [address] Physical Address-[address] [place], [place] [address] Toll Free Main # [phone] Fax # [phone] Cell # [phone] [email or web address]

## 2022-02-06 ENCOUNTER — Other Ambulatory Visit: Payer: PPO

## 2022-02-06 DIAGNOSIS — N3001 Acute cystitis with hematuria: Secondary | ICD-10-CM

## 2022-02-06 LAB — URINALYSIS, ROUTINE W REFLEX MICROSCOPIC
Bilirubin, UA: NEGATIVE
Glucose, UA: NEGATIVE
Ketones, UA: NEGATIVE
Nitrite, UA: NEGATIVE
Specific Gravity, UA: 1.015 (ref 1.005–1.030)
Urobilinogen, Ur: 0.2 mg/dL (ref 0.2–1.0)
pH, UA: 5.5 (ref 5.0–7.5)

## 2022-02-06 LAB — MICROSCOPIC EXAMINATION: Renal Epithel, UA: NONE SEEN /hpf

## 2022-02-08 DIAGNOSIS — H25812 Combined forms of age-related cataract, left eye: Secondary | ICD-10-CM | POA: Diagnosis not present

## 2022-02-08 DIAGNOSIS — H269 Unspecified cataract: Secondary | ICD-10-CM | POA: Diagnosis not present

## 2022-02-08 LAB — URINE CULTURE

## 2022-02-12 ENCOUNTER — Other Ambulatory Visit: Payer: Self-pay | Admitting: Family Medicine

## 2022-02-12 DIAGNOSIS — F5101 Primary insomnia: Secondary | ICD-10-CM

## 2022-03-01 DIAGNOSIS — H269 Unspecified cataract: Secondary | ICD-10-CM | POA: Diagnosis not present

## 2022-03-01 DIAGNOSIS — H25811 Combined forms of age-related cataract, right eye: Secondary | ICD-10-CM | POA: Diagnosis not present

## 2022-03-05 ENCOUNTER — Ambulatory Visit (INDEPENDENT_AMBULATORY_CARE_PROVIDER_SITE_OTHER): Payer: PPO

## 2022-03-05 VITALS — Ht 65.0 in | Wt 160.0 lb

## 2022-03-05 DIAGNOSIS — Z0000 Encounter for general adult medical examination without abnormal findings: Secondary | ICD-10-CM

## 2022-03-05 NOTE — Progress Notes (Signed)
Subjective:   Rhonda [name] is a 88 y.o. female who presents for Medicare Annual (Subsequent) preventive examination.   I connected with  [name] on 03/05/22 by a audio enabled telemedicine application and verified that I am speaking with the correct person using two identifiers.  Patient Location: Home  Provider Location: Home Office  I discussed the limitations of evaluation and management by telemedicine. The patient expressed understanding and agreed to proceed.  Review of Systems     Cardiac Risk Factors include: advanced age (>21mn, >>6women);hypertension     Objective:    Today's Vitals   03/05/22 0817  Weight: 160 lb (72.6 kg)  Height: '5\' 5"'$  (1.651 m)   Body mass index is 26.63 kg/m.     03/05/2022    8:24 AM 02/17/2021    8:37 AM 02/17/2020    8:49 AM 10/12/2019    5:03 PM 09/28/2019   11:10 AM 02/16/2019    8:43 AM 10/18/2018   12:42 PM  Advanced Directives  Does Patient Have a Medical Advance Directive? No Yes Yes No No Yes No  Type of AArmed forces technical officerof AFederal-MogulPower of Attorney   Does patient want to make changes to medical advance directive?   No - Patient declined No - Patient declined No - Patient declined No - Patient declined   Copy of HFreedomin Chart?  No - copy requested No - copy requested   No - copy requested   Would patient like information on creating a medical advance directive? No - Patient declined   No - Patient declined   No - Patient declined    Current Medications (verified) Outpatient Encounter Medications as of 03/05/2022  Medication Sig   acetaminophen (TYLENOL) 650 MG CR tablet Take 1 tablet (650 mg total) by mouth every 8 (eight) hours as needed for pain. (Patient taking differently: Take 1,300 mg by mouth every 8 (eight) hours as needed for pain.)   cholecalciferol (VITAMIN D3) 25 MCG (1000 UT) tablet Take 2,000 Units by mouth 2  (two) times daily.   diclofenac Sodium (VOLTAREN) 1 % GEL Apply 2 g topically 4 (four) times daily.   diltiazem (TIAZAC) 360 MG 24 hr capsule TAKE ONE (1) CAPSULE EACH DAY   ELIQUIS 5 MG TABS tablet TAKE ONE TABLET BY MOUTH TWICE DAILY   flecainide (TAMBOCOR) 50 MG tablet TAKE ONE (1) TABLET BY MOUTH TWO (2) TIMES DAILY   Multiple Vitamins-Minerals (PRESERVISION AREDS 2 PO) Take by mouth.   zolpidem (AMBIEN) 5 MG tablet Take 1 tablet (5 mg total) by mouth at bedtime as needed for sleep. Put on file until patient asked for refill   No facility-administered encounter medications on file as of 03/05/2022.    Allergies (verified) Patient has no known allergies.   History: Past Medical History:  Diagnosis Date   Arthritis    [place]rial [place]   Dysrhythmia    afib   Hypertension    Osteopenia    Syncope 04/2014   Past Surgical History:  Procedure Laterality Date   BREAST EXCISIONAL BIOPSY Left    BREAST EXCISIONAL BIOPSY Right    COLONOSCOPY  2011   KNEE ARTHROCENTESIS Bilateral    KNEE ARTHROSCOPY     bil   POLYPECTOMY  11-01-2009   ROTATOR CUFF REPAIR     SHOULDER SURGERY Left    TOTAL KNEE ARTHROPLASTY Left  TOTAL KNEE ARTHROPLASTY Right 10/12/2019   Procedure: TOTAL KNEE ARTHROPLASTY;  Surgeon: Gaynelle Arabian, MD;  Location: [facility];  Service: Orthopedics;  Laterality: Right;  34mn   VAGINAL HYSTERECTOMY     twice - vaginal and then oopherectomy   Family History  Problem Relation Age of Onset   Heart failure Mother    Hypertension Father    Arthritis Father    Hypertension Sister    Arthritis Sister    Cancer Brother    Bladder Cancer Brother    Hypertension Brother    Diabetes Brother    Social History   Socioeconomic History   Marital status: Widowed    Spouse name: Not on file   Number of children: 1   Years of education: 12   Highest education level: High school graduate  Occupational History   Occupation: retired  Tobacco Use    Smoking status: Never   Smokeless tobacco: Never  Vaping Use   Vaping Use: Never used  Substance and Sexual Activity   Alcohol use: No    Alcohol/week: 0.0 standard drinks of alcohol   Drug use: No   Sexual activity: Not Currently  Other Topics Concern   Not on file  Social History Narrative     Son lives 20 minutes away   Social Determinants of Health   Financial Resource Strain: Low Risk  (03/05/2022)   Overall Financial Resource Strain (CARDIA)    Difficulty of Paying Living Expenses: Not hard at all  Food Insecurity: No Food Insecurity (03/05/2022)   Hunger Vital Sign    Worried About Running Out of Food in the Last Year: Never true    RPlantationin the Last Year: Never true  Transportation Needs: No Transportation Needs (03/05/2022)   PRAPARE - THydrologist(Medical): No    Lack of Transportation (Non-Medical): No  Physical Activity: Sufficiently Active (03/05/2022)   Exercise Vital Sign    Days of Exercise per Week: 5 days    Minutes of Exercise per Session: 30 min  Stress: No Stress Concern Present (03/05/2022)   [place]   Feeling of Stress : Not at all  Social Connections: Moderately Integrated (03/05/2022)   Social Connection and Isolation Panel [NHANES]    Frequency of Communication with Friends and Family: More than three times a week    Frequency of Social Gatherings with Friends and Family: More than three times a week    Attends Religious Services: More than 4 times per year    Active Member of [employer] Organizations: Yes    Attends CArchivistMeetings: More than 4 times per year    Marital Status: Widowed    Tobacco Counseling Counseling given: Not Answered   Clinical Intake:  Pre-visit preparation completed: Yes  Pain : No/denies pain     Nutritional Risks: None Diabetes: No  How often do you need to have someone help you when you  read instructions, pamphlets, or other written materials from your doctor or pharmacy?: 1 - Never  Diabetic?no   Interpreter Needed?: No  Information entered by :: LJadene Pierini LPN   Activities of Daily Living    03/05/2022    8:23 AM  In your present state of health, do you have any difficulty performing the following activities:  Hearing? 0  Vision? 0  Difficulty concentrating or making decisions? 0  Walking or climbing stairs? 0  Dressing  or bathing? 0  Doing errands, shopping? 0  Preparing Food and eating ? N  Using the Toilet? N  In the past six months, have you accidently leaked urine? N  Do you have problems with loss of bowel control? N  Managing your Medications? N  Managing your Finances? N  Housekeeping or managing your Housekeeping? N    Patient Care Team: Dettinger, Fransisca Kaufmann, MD as PCP - General (Family Medicine) [name], MD as PCP - Cardiology (Cardiology) [name], MD as Consulting Physician (Cardiology) Latanya Maudlin, MD as Consulting Physician (Orthopedic Surgery)  Indicate any recent Medical Services you may have received from other than [facility] providers in the past year (date may be approximate).     Assessment:   This is a routine wellness examination for Rhonda [name].  Hearing/Vision screen Vision Screening - Comments:: Annual eye exams wear glasses   Dietary issues and exercise activities discussed: Current Exercise Habits: Home exercise routine, Type of exercise: walking, Time (Minutes): 30, Frequency (Times/Week): 5, Weekly Exercise (Minutes/Week): 150, Intensity: Mild, Exercise limited by: None identified   Goals Addressed               This Visit's Progress     Patient Stated (pt-stated)   On track     Get back in exercise routine since having knee surgery       Depression Screen    03/05/2022    8:22 AM 01/23/2022    3:23 PM 01/03/2022    9:23 AM 12/26/2021    9:50 AM 12/15/2021   11:26 AM 09/08/2021   10:31 AM  02/17/2021    8:26 AM  PHQ 2/9 Scores  PHQ - 2 Score 0 0 0 0 0 0 0  PHQ- 9 Score 0 0 0 0 0      Fall Risk    03/05/2022    8:19 AM 01/23/2022    3:21 PM 01/03/2022    9:32 AM 01/03/2022    9:23 AM 12/26/2021    9:50 AM  [place] in the past year? 0 0 0 0 0  Number falls in past yr: 0      Injury with Fall? 0      Risk for fall due to : No Fall Risks      Follow up Falls prevention discussed        [place]:  Any stairs in or around the home? Yes  If so, are there any without handrails? No  Home free of loose throw rugs in walkways, pet beds, electrical cords, etc? Yes  Adequate lighting in your home to reduce risk of falls? Yes   ASSISTIVE DEVICES UTILIZED TO PREVENT FALLS:  Life alert? No  Use of a cane, walker or w/c? No  Grab bars in the bathroom? Yes  Shower chair or bench in shower? Yes  Elevated toilet seat or a handicapped toilet? Yes        02/11/2018    9:16 AM 09/24/2016    8:28 AM  MMSE - Mini Mental State Exam  Orientation to time 5 5  Orientation to Place 5 5  Registration 3 3  Attention/ Calculation 5 5  Recall 3 3  Language- name 2 objects 2 2  Language- repeat 1 1  Language- follow 3 step command 3 3  Language- read & follow direction 1 1  Write a sentence 1 1  Copy design 1 1  Total score 30 30  03/05/2022    8:24 AM 02/17/2020    8:47 AM 02/16/2019    8:47 AM  6CIT Screen  What Year? 0 points 0 points 0 points  What month? 0 points 0 points 0 points  What time? 0 points 0 points 0 points  Count back from 20 0 points 0 points 0 points  Months in reverse 0 points 0 points 0 points  Repeat phrase 0 points 0 points 2 points  Total Score 0 points 0 points 2 points    Immunizations Immunization History  Administered Date(s) Administered   Fluad Quad(high Dose 65+) 03/19/2016, 02/27/2019   Influenza, High Dose Seasonal PF 03/19/2016, 02/27/2017, 02/11/2018   Influenza, Seasonal,  Injecte, Preservative Fre 03/23/2015   Influenza,inj,Quad PF,6+ Mos 03/23/2015, 03/23/2015, 02/16/2021   Influenza,inj,quad, With Preservative 01/21/2017   Influenza,trivalent, recombinat, inj, PF 03/11/2014   Influenza-Unspecified 02/13/2006, 03/11/2014   Moderna Covid-19 Vaccine Bivalent Booster 40yr & up 04/04/2021   Moderna Sars-Covid-2 Vaccination 05/27/2019, 06/25/2019, 09/13/2020   PFIZER(Purple Top)SARS-COV-2 Vaccination 02/24/2020   Pneumococcal Conjugate-13 09/24/2016   Pneumococcal Polysaccharide-23 11/18/2017   Zoster Recombinat (Shingrix) 09/08/2021    TDAP status: Due, Education has been provided regarding the importance of this vaccine. Advised may receive this vaccine at local pharmacy or Health Dept. Aware to provide a copy of the vaccination record if obtained from local pharmacy or Health Dept. Verbalized acceptance and understanding.  Flu Vaccine status: Due, Education has been provided regarding the importance of this vaccine. Advised may receive this vaccine at local pharmacy or Health Dept. Aware to provide a copy of the vaccination record if obtained from local pharmacy or Health Dept. Verbalized acceptance and understanding.  Pneumococcal vaccine status: Up to date  Covid-19 vaccine status: Completed vaccines  Qualifies for Shingles Vaccine? Yes   Zostavax completed Yes   Shingrix Completed?: Yes  Screening Tests Health Maintenance  Topic Date Due   COVID-19 Vaccine (6 - Mixed Product risk series) 05/30/2021   Zoster Vaccines- Shingrix (2 of 2) 11/03/2021   DEXA SCAN  03/01/2022   INFLUENZA VACCINE  07/22/2022 (Originally 11/21/2021)   TETANUS/TDAP  09/09/2022 (Originally 11/27/1951)   Medicare Annual Wellness (AWV)  03/06/2023   Pneumonia Vaccine 88 Years old  Completed   HPV VACCINES  Aged Out    Health Maintenance  Health Maintenance Due  Topic Date Due   COVID-19 Vaccine (6 - Mixed Product risk series) 05/30/2021   Zoster Vaccines- Shingrix (2 of  2) 11/03/2021   DEXA SCAN  03/01/2022    Colorectal cancer screening: No longer required.   Mammogram status: No longer required due to age.  Bone Density status: Completed 07/11/2020. Results reflect: Bone density results: OSTEOPENIA. Repeat every 5 years.  Lung Cancer Screening: (Low Dose CT Chest recommended if Age 86-80years, 30 pack-year currently smoking OR have quit w/in 15years.) does not qualify.   Lung Cancer Screening Referral: n/a  Additional Screening:  Hepatitis C Screening: does not qualify;   Vision Screening: Recommended annual ophthalmology exams for early detection of glaucoma and other disorders of the eye. Is the patient up to date with their annual eye exam?  Yes  Who is the provider or what is the name of the office in which the patient attends annual eye exams? Dr.Lee  If pt is not established with a provider, would they like to be referred to a provider to establish care? No .   Dental Screening: Recommended annual dental exams for proper oral hygiene  Community Resource Referral /  Chronic Care Management: CRR required this visit?  No   CCM required this visit?  No      Plan:     I have personally reviewed and noted the following in the patient's chart:   Medical and social history Use of alcohol, tobacco or illicit drugs  Current medications and supplements including opioid prescriptions. Patient is not currently taking opioid prescriptions. Functional ability and status Nutritional status Physical activity Advanced directives List of other physicians Hospitalizations, surgeries, and ER visits in previous 12 months Vitals Screenings to include cognitive, depression, and falls Referrals and appointments  In addition, I have reviewed and discussed with patient certain preventive protocols, quality metrics, and best practice recommendations. A written personalized care plan for preventive services as well as general preventive health  recommendations were provided to patient.     Daphane Shepherd, LPN   [id number]   Nurse Notes: Due TDAP/Flu vaccine

## 2022-03-05 NOTE — Patient Instructions (Signed)
Rhonda [name] , Thank you for taking time to come for your Medicare Wellness Visit. I appreciate your ongoing commitment to your health goals. Please review the following plan we discussed and let me know if I can assist you in the future.   These are the goals we discussed:  Goals       DIET - INCREASE WATER INTAKE      Try to drink 6-8 glasses of water daily      Patient Stated (pt-stated)      Get back in exercise routine since having knee surgery      Weight (lb) < 160 lb (72.6 kg)      Reduce snacks between meals.         This is a list of the screening recommended for you and due dates:  Health Maintenance  Topic Date Due   COVID-19 Vaccine (6 - Mixed Product risk series) 05/30/2021   Zoster (Shingles) Vaccine (2 of 2) 11/03/2021   DEXA scan (bone density measurement)  03/01/2022   Flu Shot  07/22/2022*   Tetanus Vaccine  09/09/2022*   Medicare Annual Wellness Visit  03/06/2023   Pneumonia Vaccine  Completed   HPV Vaccine  Aged Out  *Topic was postponed. The date shown is not the original due date.    Advanced directives: Advance directive discussed with you today. I have provided a copy for you to complete at home and have notarized. Once this is complete please bring a copy in to our office so we can scan it into your chart.   Conditions/risks identified: Aim for 30 minutes of exercise or brisk walking, 6-8 glasses of water, and 5 servings of fruits and vegetables each day.   Next appointment: Follow up in one year for your annual wellness visit    Preventive Care 65 Years and Older, Female Preventive care refers to lifestyle choices and visits with your health care provider that can promote health and wellness. What does preventive care include? A yearly physical exam. This is also called an annual well check. Dental exams once or twice a year. Routine eye exams. Ask your health care provider how often you should have your eyes checked. Personal lifestyle choices,  including: Daily care of your teeth and gums. Regular physical activity. Eating a healthy diet. Avoiding tobacco and drug use. Limiting alcohol use. Practicing safe sex. Taking low-dose aspirin every day. Taking vitamin and mineral supplements as recommended by your health care provider. What happens during an annual well check? The services and screenings done by your health care provider during your annual well check will depend on your age, overall health, lifestyle risk factors, and family history of disease. Counseling  Your health care provider may ask you questions about your: Alcohol use. Tobacco use. Drug use. Emotional well-being. Home and relationship well-being. Sexual activity. Eating habits. History of falls. Memory and ability to understand (cognition). Work and work Statistician. Reproductive health. Screening  You may have the following tests or measurements: Height, weight, and BMI. Blood pressure. Lipid and cholesterol levels. These may be checked every 5 years, or more frequently if you are over 41 years old. Skin check. Lung cancer screening. You may have this screening every year starting at age 2 if you have a 30-pack-year history of smoking and currently smoke or have quit within the past 15 years. Fecal occult blood test (FOBT) of the stool. You may have this test every year starting at age 84. Flexible sigmoidoscopy or colonoscopy. You  may have a sigmoidoscopy every 5 years or a colonoscopy every 10 years starting at age 82. Hepatitis C blood test. Hepatitis B blood test. Sexually transmitted disease (STD) testing. Diabetes screening. This is done by checking your blood sugar (glucose) after you have not eaten for a while (fasting). You may have this done every 1-3 years. Bone density scan. This is done to screen for osteoporosis. You may have this done starting at age 73. Mammogram. This may be done every 1-2 years. Talk to your health care provider  about how often you should have regular mammograms. Talk with your health care provider about your test results, treatment options, and if necessary, the need for more tests. Vaccines  Your health care provider may recommend certain vaccines, such as: Influenza vaccine. This is recommended every year. Tetanus, diphtheria, and acellular pertussis (Tdap, Td) vaccine. You may need a Td booster every 10 years. Zoster vaccine. You may need this after age 84. Pneumococcal 13-valent conjugate (PCV13) vaccine. One dose is recommended after age 43. Pneumococcal polysaccharide (PPSV23) vaccine. One dose is recommended after age 40. Talk to your health care provider about which screenings and vaccines you need and how often you need them. This information is not intended to replace advice given to you by your health care provider. Make sure you discuss any questions you have with your health care provider. Document Released: 05/06/2015 Document Revised: 12/28/2015 Document Reviewed: 02/08/2015 Elsevier Interactive Patient Education  2017 [employer] Prevention in the Home Falls can cause injuries. They can happen to people of all ages. There are many things you can do to make your home safe and to help prevent falls. What can I do on the outside of my home? Regularly fix the edges of walkways and driveways and fix any cracks. Remove anything that might make you trip as you walk through a door, such as a raised step or threshold. Trim any bushes or trees on the path to your home. Use bright outdoor lighting. Clear any walking paths of anything that might make someone trip, such as rocks or tools. Regularly check to see if handrails are loose or broken. Make sure that both sides of any steps have handrails. Any raised decks and porches should have guardrails on the edges. Have any leaves, snow, or ice cleared regularly. Use sand or salt on walking paths during winter. Clean up any spills in  your garage right away. This includes oil or grease spills. What can I do in the bathroom? Use night lights. Install grab bars by the toilet and in the tub and shower. Do not use towel bars as grab bars. Use non-skid mats or decals in the tub or shower. If you need to sit down in the shower, use a plastic, non-slip stool. Keep the floor dry. Clean up any water that spills on the floor as soon as it happens. Remove soap buildup in the tub or shower regularly. Attach bath mats securely with double-sided non-slip rug tape. Do not have throw rugs and other things on the floor that can make you trip. What can I do in the bedroom? Use night lights. Make sure that you have a light by your bed that is easy to reach. Do not use any sheets or blankets that are too big for your bed. They should not hang down onto the floor. Have a firm chair that has side arms. You can use this for support while you get dressed. Do not have throw  rugs and other things on the floor that can make you trip. What can I do in the kitchen? Clean up any spills right away. Avoid walking on wet floors. Keep items that you use a lot in easy-to-reach places. If you need to reach something above you, use a strong step stool that has a grab bar. Keep electrical cords out of the way. Do not use floor polish or wax that makes floors slippery. If you must use wax, use non-skid floor wax. Do not have throw rugs and other things on the floor that can make you trip. What can I do with my stairs? Do not leave any items on the stairs. Make sure that there are handrails on both sides of the stairs and use them. Fix handrails that are broken or loose. Make sure that handrails are as long as the stairways. Check any carpeting to make sure that it is firmly attached to the stairs. Fix any carpet that is loose or worn. Avoid having throw rugs at the top or bottom of the stairs. If you do have throw rugs, attach them to the floor with carpet  tape. Make sure that you have a light switch at the top of the stairs and the bottom of the stairs. If you do not have them, ask someone to add them for you. What else can I do to help prevent falls? Wear shoes that: Do not have high heels. Have rubber bottoms. Are comfortable and fit you well. Are closed at the toe. Do not wear sandals. If you use a stepladder: Make sure that it is fully opened. Do not climb a closed stepladder. Make sure that both sides of the stepladder are locked into place. Ask someone to hold it for you, if possible. Clearly mark and make sure that you can see: Any grab bars or handrails. First and last steps. Where the edge of each step is. Use tools that help you move around (mobility aids) if they are needed. These include: Canes. Walkers. Scooters. Crutches. Turn on the lights when you go into a dark area. Replace any light bulbs as soon as they burn out. Set up your furniture so you have a clear path. Avoid moving your furniture around. If any of your floors are uneven, fix them. If there are any pets around you, be aware of where they are. Review your medicines with your doctor. Some medicines can make you feel dizzy. This can increase your chance of falling. Ask your doctor what other things that you can do to help prevent falls. This information is not intended to replace advice given to you by your health care provider. Make sure you discuss any questions you have with your health care provider. Document Released: 02/03/2009 Document Revised: 09/15/2015 Document Reviewed: 05/14/2014 Elsevier Interactive Patient Education  2017 [employer].

## 2022-03-08 DIAGNOSIS — H40033 Anatomical narrow angle, bilateral: Secondary | ICD-10-CM | POA: Diagnosis not present

## 2022-03-08 DIAGNOSIS — H2513 Age-related nuclear cataract, bilateral: Secondary | ICD-10-CM | POA: Diagnosis not present

## 2022-03-12 ENCOUNTER — Other Ambulatory Visit: Payer: Self-pay | Admitting: Family Medicine

## 2022-03-12 ENCOUNTER — Encounter: Payer: Self-pay | Admitting: Family Medicine

## 2022-03-12 ENCOUNTER — Ambulatory Visit (INDEPENDENT_AMBULATORY_CARE_PROVIDER_SITE_OTHER): Payer: PPO | Admitting: Family Medicine

## 2022-03-12 ENCOUNTER — Ambulatory Visit (INDEPENDENT_AMBULATORY_CARE_PROVIDER_SITE_OTHER): Payer: PPO

## 2022-03-12 ENCOUNTER — Other Ambulatory Visit: Payer: Self-pay

## 2022-03-12 VITALS — BP 126/77 | HR 74 | Temp 96.6°F | Ht 65.0 in | Wt 160.2 lb

## 2022-03-12 DIAGNOSIS — I4811 Longstanding persistent atrial fibrillation: Secondary | ICD-10-CM

## 2022-03-12 DIAGNOSIS — M810 Age-related osteoporosis without current pathological fracture: Secondary | ICD-10-CM

## 2022-03-12 DIAGNOSIS — Z23 Encounter for immunization: Secondary | ICD-10-CM

## 2022-03-12 DIAGNOSIS — I10 Essential (primary) hypertension: Secondary | ICD-10-CM | POA: Diagnosis not present

## 2022-03-12 DIAGNOSIS — F5101 Primary insomnia: Secondary | ICD-10-CM | POA: Diagnosis not present

## 2022-03-12 DIAGNOSIS — Z780 Asymptomatic menopausal state: Secondary | ICD-10-CM

## 2022-03-12 MED ORDER — FLECAINIDE ACETATE 50 MG PO TABS: ORAL_TABLET | ORAL | 1 refills | Status: DC

## 2022-03-12 MED ORDER — ZOLPIDEM TARTRATE 5 MG PO TABS: 5.0000 mg | ORAL_TABLET | Freq: Every evening | ORAL | 5 refills | Status: DC | PRN

## 2022-03-12 NOTE — Progress Notes (Unsigned)
BP 126/77   Pulse 74   Temp (!) 96.6 F (35.9 C) (Temporal)   Ht _0  (1.651 m)   Wt 160 lb 3.2 oz (72.7 kg)   SpO2 98%   BMI 26.66 kg/m    Subjective:   Patient ID: Rhonda [name], female    DOB: 03/24/1933, 88 y.o.   MRN: [id number]  HPI: Rhonda [name] is a 88 y.o. female presenting on 03/12/2022 for Medical Management of Chronic Issues (6 month follow up )  General Overall things are going well. Patient recently had caratact surgery and reports that her vision is much improved from prior to the surgery. Reports good social support in her son and his family, who live nearby. Reports adherence to medications. Patient denies any chest pain, shortness of breath, headaches or vision issues, abdominal complaints, diarrhea, nausea, vomiting, or joint issues.   Hypertension  Hyperlipidemia  Afib Patient continues to be compliant with her medications, including diltiazem and eliquis. Does not report chest pain or shortness of breath. No recent changes to diet or exercise. She reports that she eats a balanced diet. Has been checking blood pressure readings at home and reports that they are within a similar range as was measured in the office today.    Relevant past medical, surgical, family and social history reviewed and updated as indicated. Interim medical history since our last visit reviewed. Allergies and medications reviewed and updated.  Review of Systems  Review of Systems  Constitutional:  Negative for activity change, appetite change, chills, diaphoresis, fatigue, fever and unexpected weight change.  HENT:  Negative for congestion, ear pain, facial swelling, sneezing and sore throat.   Respiratory:  Negative for cough, choking, chest tightness and shortness of breath.   Cardiovascular:  Negative for chest pain, palpitations and leg swelling.  Gastrointestinal:  Negative for blood in stool, constipation, diarrhea and nausea.  Endocrine: Negative for cold intolerance  and heat intolerance.  Genitourinary:  Negative for difficulty urinating, dysuria, penile pain, testicular pain and urgency.  Musculoskeletal:  Negative for arthralgias and back pain.  Skin:  Negative for color change and pallor.  Neurological:  Negative for dizziness, numbness and headaches.    Per HPI unless specifically indicated above  Allergies as of 03/12/2022   No Known Allergies      Medication List        Accurate as of March 12, 2022 10:13 AM. If you have any questions, ask your nurse or doctor.          acetaminophen 650 MG CR tablet Commonly known as: TYLENOL Take 1 tablet (650 mg total) by mouth every 8 (eight) hours as needed for pain. What changed: how much to take   cholecalciferol 25 MCG (1000 UNIT) tablet Commonly known as: VITAMIN D3 Take 2,000 Units by mouth 2 (two) times daily.   diclofenac Sodium 1 % Gel Commonly known as: Voltaren Apply 2 g topically 4 (four) times daily.   diltiazem 360 MG 24 hr capsule Commonly known as: TIAZAC TAKE ONE (1) CAPSULE EACH DAY   Eliquis 5 MG Tabs tablet Generic drug: apixaban TAKE ONE TABLET BY MOUTH TWICE DAILY   flecainide 50 MG tablet Commonly known as: TAMBOCOR TAKE ONE (1) TABLET BY MOUTH TWO (2) TIMES DAILY   prednisoLONE acetate 1 % ophthalmic suspension Commonly known as: PRED FORTE   PRESERVISION AREDS 2 PO Take by mouth.   zolpidem 5 MG tablet Commonly known as: AMBIEN Take 1 tablet (5 mg total)  by mouth at bedtime as needed for sleep. Put on file until patient asked for refill        Objective:   BP 126/77   Pulse 74   Temp (!) 96.6 F (35.9 C) (Temporal)   Ht _0  (1.651 m)   Wt 160 lb 3.2 oz (72.7 kg)   SpO2 98%   BMI 26.66 kg/m   Wt Readings from Last 3 Encounters:  03/12/22 160 lb 3.2 oz (72.7 kg)  03/05/22 160 lb (72.6 kg)  01/23/22 163 lb (73.9 kg)    Physical Exam  Physical Examination:  General: Awake, alert, well nourished, No acute distress Cardio: regular  rate and rhythm, S1S2 heard, no murmurs appreciated Pulm: clear to auscultation bilaterally, no wheezes, rhonchi or rales; normal work of breathing on room air Extremities: warm, well perfused, No edema, cyanosis or clubbing; +1 pulses bilaterally Skin: dry; intact; no rashes or lesions    Assessment & Plan:   Problem List Items Addressed This Visit       Cardiovascular and Mediastinum   Atrial fibrillation (HCC)   Relevant Medications   flecainide (TAMBOCOR) 50 MG tablet   Other Relevant Orders   CBC with Differential/Platelet   Lipid panel   Essential hypertension   Relevant Medications   flecainide (TAMBOCOR) 50 MG tablet   Other Relevant Orders   CMP14+EGFR   Lipid panel     Musculoskeletal and Integument   Osteoporosis   Relevant Orders   DG [facility] DEXA (Completed)     Other   Insomnia   Relevant Medications   zolpidem (AMBIEN) 5 MG tablet   Other Relevant Orders   ToxASSURE Select 13 (MW), Urine   Other Visit Diagnoses     Need for immunization against influenza    -  Primary   Relevant Orders   Flu Vaccine QUAD High Dose(Fluad) (Completed)       Assessment:   Patient reports good adherence to medications and does not report any new concerns at this time. No changes needed to medications given that blood pressure is within range. Will compel flu vaccine, re-sign contract for Ambien medication, check Utox, and complete DEXA scan today. Will check CBC, CMP, and Lipid levels.   Follow up plan: Return in about 6 months (around 09/10/2022), or if symptoms worsen or fail to improve, for Insomnia recheck.  Counseling provided for all of the vaccine components Orders Placed This Encounter  Procedures   Flu Vaccine QUAD High Dose(Fluad)   Stephani Police, MS3 03/12/2022, 10:13 AM  Patient seen and examined with medical student.  Agree with assessment and plan above.  Patient seems to be doing well, no changes.  Follow-up in 6 months.  Will check blood work. Caryl Pina, MD [facility] 03/14/2022, 3:37 PM

## 2022-03-15 LAB — TOXASSURE SELECT 13 (MW), URINE

## 2022-05-29 ENCOUNTER — Other Ambulatory Visit: Payer: Self-pay | Admitting: Cardiology

## 2022-05-29 NOTE — Telephone Encounter (Signed)
Prescription refill request for Eliquis received. Indication: PAF Last office visit: 01/17/22  Vita Barley MD Scr: 0.85 on 09/08/21  Epic Age: 89 Weight: 72.1kg  Based on above findings Eliquis '5mg'$  twice daily is the appropriate dose.  Refill approved.

## 2022-07-04 ENCOUNTER — Ambulatory Visit (INDEPENDENT_AMBULATORY_CARE_PROVIDER_SITE_OTHER): Payer: PPO | Admitting: Family Medicine

## 2022-07-04 ENCOUNTER — Encounter: Payer: Self-pay | Admitting: Family Medicine

## 2022-07-04 VITALS — BP 146/71 | HR 70 | Ht 65.0 in | Wt 165.0 lb

## 2022-07-04 DIAGNOSIS — M25512 Pain in left shoulder: Secondary | ICD-10-CM | POA: Diagnosis not present

## 2022-07-04 DIAGNOSIS — G8929 Other chronic pain: Secondary | ICD-10-CM

## 2022-07-04 DIAGNOSIS — M810 Age-related osteoporosis without current pathological fracture: Secondary | ICD-10-CM

## 2022-07-04 MED ORDER — METHYLPREDNISOLONE ACETATE 80 MG/ML IJ SUSP
80.0000 mg | Freq: Once | INTRAMUSCULAR | Status: AC
  Administered 2022-07-04: 80 mg via INTRA_ARTICULAR

## 2022-07-04 NOTE — Progress Notes (Signed)
BP (!) 146/71   Pulse 70   Ht '5\' 5"'$  (1.651 m)   Wt 165 lb (74.8 kg)   SpO2 95%   BMI 27.46 kg/m    Subjective:   Patient ID: Rhonda [name], female    DOB: 12/21/1932, 89 y.o.   MRN: [id number]  HPI: Rhonda [name] is a 89 y.o. female presenting on 07/04/2022 for Shoulder Pain   HPI Shoulder pain Patient has been having left shoulder pain for few months at least and has been seen for this before but says it has been a lot worse recently over the past couple months and then over the past couple days its gotten to the point where she cannot sleep because the pain is more significant.  It does hurt all the back and the front of that left shoulder and down the lateral aspect of her upper arm.  She says it hurts worse with overhead range of motion and across body range of motion.  She has had rotator cuff surgery reconstruction on that shoulder previously.  She denies any numbness or weakness.  She does have some decreased range of motion that she cannot reach overhead.  Relevant past medical, surgical, family and social history reviewed and updated as indicated. Interim medical history since our last visit reviewed. Allergies and medications reviewed and updated.  Review of Systems  Constitutional:  Negative for chills and fever.  Eyes:  Negative for visual disturbance.  Respiratory:  Negative for chest tightness and shortness of breath.   Cardiovascular:  Negative for chest pain and leg swelling.  Skin:  Negative for rash.  Neurological:  Negative for dizziness, light-headedness and headaches.  Psychiatric/Behavioral:  Positive for dysphoric mood. Negative for agitation, behavioral problems, self-injury and suicidal ideas. The patient is nervous/anxious.   All other systems reviewed and are negative.   Per HPI unless specifically indicated above   Allergies as of 07/04/2022   No Known Allergies      Medication List        Accurate as of July 04, 2022  4:05 PM. If  you have any questions, ask your nurse or doctor.          acetaminophen 650 MG CR tablet Commonly known as: TYLENOL Take 1 tablet (650 mg total) by mouth every 8 (eight) hours as needed for pain. What changed: how much to take   cholecalciferol 25 MCG (1000 UNIT) tablet Commonly known as: VITAMIN D3 Take 2,000 Units by mouth 2 (two) times daily.   diclofenac Sodium 1 % Gel Commonly known as: Voltaren Apply 2 g topically 4 (four) times daily.   diltiazem 360 MG 24 hr capsule Commonly known as: TIAZAC TAKE ONE (1) CAPSULE EACH DAY   Eliquis 5 MG Tabs tablet Generic drug: apixaban TAKE ONE TABLET BY MOUTH TWICE DAILY   flecainide 50 MG tablet Commonly known as: TAMBOCOR TAKE ONE (1) TABLET BY MOUTH TWO (2) TIMES DAILY   prednisoLONE acetate 1 % ophthalmic suspension Commonly known as: PRED FORTE   PRESERVISION AREDS 2 PO Take by mouth.   zolpidem 5 MG tablet Commonly known as: AMBIEN Take 1 tablet (5 mg total) by mouth at bedtime as needed for sleep. Put on file until patient asked for refill         Objective:   BP (!) 146/71   Pulse 70   Ht '5\' 5"'$  (1.651 m)   Wt 165 lb (74.8 kg)   SpO2 95%   BMI 27.46 kg/m  Wt Readings from Last 3 Encounters:  07/04/22 165 lb (74.8 kg)  03/12/22 160 lb 3.2 oz (72.7 kg)  03/05/22 160 lb (72.6 kg)    Physical Exam Vitals and nursing note reviewed.  Constitutional:      General: She is not in acute distress.    Appearance: She is well-developed. She is not diaphoretic.  Eyes:     Conjunctiva/sclera: Conjunctivae normal.  Cardiovascular:     Rate and Rhythm: Normal rate and regular rhythm.     Heart sounds: Normal heart sounds. No murmur heard. Pulmonary:     Effort: Pulmonary effort is normal. No respiratory distress.     Breath sounds: Normal breath sounds. No wheezing.  Musculoskeletal:        General: No swelling or tenderness. Normal range of motion.  Skin:    General: Skin is warm and dry.     Findings:  No rash.  Neurological:     Mental Status: She is alert and oriented to person, place, and time.     Coordination: Coordination normal.  Psychiatric:        Behavior: Behavior normal.     Left subacromial injection: Consent form signed. Risk factors of bleeding and infection discussed with patient and patient is agreeable towards injection. Patient prepped with Betadine. Lateral approach towards injection used. Injected 80 mg of Depo-Medrol and 1 mL of 2% lidocaine. Patient tolerated procedure well and no side effects from noted. Minimal to no bleeding. Simple bandage applied after.   Assessment & Plan:   Problem List Items Addressed This Visit       Musculoskeletal and Integument   Osteoporosis   Other Visit Diagnoses     Chronic left shoulder pain    -  Primary   Relevant Medications   methylPREDNISolone acetate (DEPO-MEDROL) injection 80 mg        Follow up plan: Return if symptoms worsen or fail to improve.  Counseling provided for all of the vaccine components No orders of the defined types were placed in this encounter.   Caryl Pina, MD [facility] 07/04/2022, 4:05 PM

## 2022-08-09 ENCOUNTER — Other Ambulatory Visit: Payer: Self-pay | Admitting: Family Medicine

## 2022-08-09 DIAGNOSIS — F5101 Primary insomnia: Secondary | ICD-10-CM

## 2022-09-05 ENCOUNTER — Other Ambulatory Visit: Payer: Self-pay | Admitting: Family Medicine

## 2022-09-05 DIAGNOSIS — F5101 Primary insomnia: Secondary | ICD-10-CM

## 2022-09-05 DIAGNOSIS — I10 Essential (primary) hypertension: Secondary | ICD-10-CM

## 2022-09-10 ENCOUNTER — Ambulatory Visit (INDEPENDENT_AMBULATORY_CARE_PROVIDER_SITE_OTHER): Payer: PPO | Admitting: Family Medicine

## 2022-09-10 ENCOUNTER — Encounter: Payer: Self-pay | Admitting: Family Medicine

## 2022-09-10 VITALS — BP 138/77 | HR 73 | Temp 97.4°F | Ht 65.0 in | Wt 161.0 lb

## 2022-09-10 DIAGNOSIS — I4811 Longstanding persistent atrial fibrillation: Secondary | ICD-10-CM | POA: Diagnosis not present

## 2022-09-10 DIAGNOSIS — I10 Essential (primary) hypertension: Secondary | ICD-10-CM | POA: Diagnosis not present

## 2022-09-10 DIAGNOSIS — F5101 Primary insomnia: Secondary | ICD-10-CM | POA: Diagnosis not present

## 2022-09-10 DIAGNOSIS — Z23 Encounter for immunization: Secondary | ICD-10-CM

## 2022-09-10 MED ORDER — FLECAINIDE ACETATE 50 MG PO TABS: ORAL_TABLET | ORAL | 1 refills | Status: DC

## 2022-09-10 MED ORDER — ZOLPIDEM TARTRATE 5 MG PO TABS
5.0000 mg | ORAL_TABLET | Freq: Every evening | ORAL | 5 refills | Status: DC | PRN
Start: 2022-09-10 — End: 2023-03-13

## 2022-09-10 MED ORDER — DILTIAZEM HCL ER BEADS 360 MG PO CP24
ORAL_CAPSULE | ORAL | 3 refills | Status: DC
Start: 2022-09-10 — End: 2023-02-13

## 2022-09-10 MED ORDER — DICLOFENAC SODIUM 1 % EX GEL: 2.0000 g | Freq: Four times a day (QID) | CUTANEOUS | 5 refills | Status: DC

## 2022-09-10 NOTE — Addendum Note (Signed)
Addended by: Arville Care on: 09/10/2022 09:00 AM   Modules accepted: Orders

## 2022-09-10 NOTE — Progress Notes (Signed)
BP 138/77   Pulse 73   Temp (!) 97.4 F (36.3 C)   Ht 5\' 5"  (1.651 m)   Wt 161 lb (73 kg)   SpO2 96%   BMI 26.79 kg/m    Subjective:   Patient ID: Rhonda [name], female    DOB: 1932-06-26, 89 y.o.   MRN: [id number]  HPI: Rhonda [name] is a 89 y.o. female presenting on 09/10/2022 for Medical Management of Chronic Issues   HPI Insomnia recheck Current rx-Ambien 5 mg nightly as needed # meds rx-30/month Effectiveness of current meds-works well Adverse reactions form meds-none that she complains of  Pill count performed-No Last drug screen -03/23/2022 ( high risk q56m, moderate risk q77m, low risk yearly ) Urine drug screen today- No Was the [place] reviewed-yes  If yes were their any concerning findings? -None  No flowsheet data found.   Controlled substance contract signed on: 03/23/2022  Hypertension and A-fib, sees cardiology for A-fib Patient is currently on diltiazem and Eliquis and flecainide, and their blood pressure today is 138/77. Patient denies any lightheadedness or dizziness. Patient denies headaches, blurred vision, chest pains, shortness of breath, or weakness. Denies any side effects from medication and is content with current medication.   Relevant past medical, surgical, family and social history reviewed and updated as indicated. Interim medical history since our last visit reviewed. Allergies and medications reviewed and updated.  Review of Systems  Constitutional:  Negative for chills and fever.  Eyes:  Negative for visual disturbance.  Respiratory:  Negative for chest tightness and shortness of breath.   Cardiovascular:  Negative for chest pain and leg swelling.  Musculoskeletal:  Negative for back pain and gait problem.  Skin:  Negative for rash.  Neurological:  Negative for light-headedness and headaches.  Psychiatric/Behavioral:  Negative for agitation, behavioral problems, sleep disturbance and suicidal ideas. The patient is not  nervous/anxious.   All other systems reviewed and are negative.   Per HPI unless specifically indicated above   Allergies as of 09/10/2022   No Known Allergies      Medication List        Accurate as of Sep 10, 2022  8:50 AM. If you have any questions, ask your nurse or doctor.          acetaminophen 650 MG CR tablet Commonly known as: TYLENOL Take 1 tablet (650 mg total) by mouth every 8 (eight) hours as needed for pain. What changed: how much to take   cholecalciferol 25 MCG (1000 UNIT) tablet Commonly known as: VITAMIN D3 Take 2,000 Units by mouth 2 (two) times daily.   diclofenac Sodium 1 % Gel Commonly known as: Voltaren Apply 2 g topically 4 (four) times daily.   diltiazem 360 MG 24 hr capsule Commonly known as: TIAZAC TAKE ONE (1) CAPSULE EACH DAY What changed: Another medication with the same name was added. Make sure you understand how and when to take each. Changed by: Elige Radon Jesson Foskey, MD   diltiazem 360 MG 24 hr capsule Commonly known as: TIAZAC TAKE ONE (1) CAPSULE EACH DAY What changed: You were already taking a medication with the same name, and this prescription was added. Make sure you understand how and when to take each. Changed by: Elige Radon Shirah Roseman, MD   Eliquis 5 MG Tabs tablet Generic drug: apixaban TAKE ONE TABLET BY MOUTH TWICE DAILY   flecainide 50 MG tablet Commonly known as: TAMBOCOR TAKE ONE (1) TABLET BY MOUTH TWO (2) TIMES DAILY  prednisoLONE acetate 1 % ophthalmic suspension Commonly known as: PRED FORTE   PRESERVISION AREDS 2 PO Take by mouth.   zolpidem 5 MG tablet Commonly known as: AMBIEN Take 1 tablet (5 mg total) by mouth at bedtime as needed for sleep. Put on file until patient asked for refill         Objective:   Ht 5\' 5"  (1.651 m)   BMI 27.46 kg/m   Wt Readings from Last 3 Encounters:  07/04/22 165 lb (74.8 kg)  03/12/22 160 lb 3.2 oz (72.7 kg)  03/05/22 160 lb (72.6 kg)    Physical  Exam Vitals and nursing note reviewed.  Constitutional:      General: She is not in acute distress.    Appearance: She is well-developed. She is not diaphoretic.  Eyes:     Conjunctiva/sclera: Conjunctivae normal.  Cardiovascular:     Rate and Rhythm: Normal rate and regular rhythm.     Heart sounds: Normal heart sounds. No murmur heard. Pulmonary:     Effort: Pulmonary effort is normal. No respiratory distress.     Breath sounds: Normal breath sounds. No wheezing or rhonchi.  Musculoskeletal:        General: Swelling (1+ edema bilateral lower extremities) present. Normal range of motion.  Skin:    General: Skin is warm and dry.     Findings: No rash.  Neurological:     Mental Status: She is alert and oriented to person, place, and time.     Coordination: Coordination normal.  Psychiatric:        Behavior: Behavior normal.     Results for orders placed or performed in visit on 03/12/22  ToxASSURE Select 13 (MW), Urine  Result Value Ref Range   Summary Note     Assessment & Plan:   Problem List Items Addressed This Visit       Cardiovascular and Mediastinum   Atrial fibrillation (HCC)   Relevant Medications   diltiazem (TIAZAC) 360 MG 24 hr capsule   flecainide (TAMBOCOR) 50 MG tablet   Essential hypertension   Relevant Medications   diltiazem (TIAZAC) 360 MG 24 hr capsule   flecainide (TAMBOCOR) 50 MG tablet   Other Relevant Orders   CBC with Differential/Platelet   CMP14+EGFR   Lipid panel     Other   Insomnia - Primary   Relevant Medications   zolpidem (AMBIEN) 5 MG tablet   Other Visit Diagnoses     Need for shingles vaccine       Relevant Orders   Zoster Recombinant (Shingrix ) (Completed)       Continue current medicine, seems to be doing well, continue follow-up with cardiology as well.  Will check blood work today. Follow up plan: Return in about 6 months (around 03/13/2023), or if symptoms worsen or fail to improve, for Hypertension and A-fib  and insomnia.  Counseling provided for all of the vaccine components Orders Placed This Encounter  Procedures   Zoster Recombinant (Shingrix )   CBC with Differential/Platelet   CMP14+EGFR   Lipid panel    Arville Care, MD [facility] 09/10/2022, 8:50 AM

## 2022-09-11 LAB — CBC WITH DIFFERENTIAL/PLATELET
Basophils Absolute: 0.1 10*3/uL (ref 0.0–0.2)
Basos: 1 %
EOS (ABSOLUTE): 0.1 10*3/uL (ref 0.0–0.4)
Eos: 1 %
Hematocrit: 41.8 % (ref 34.0–46.6)
Hemoglobin: 13.9 g/dL (ref 11.1–15.9)
Immature Grans (Abs): 0 10*3/uL (ref 0.0–0.1)
Immature Granulocytes: 0 %
Lymphocytes Absolute: 1.2 10*3/uL (ref 0.7–3.1)
Lymphs: 20 %
MCH: 29.4 pg (ref 26.6–33.0)
MCHC: 33.3 g/dL (ref 31.5–35.7)
MCV: 89 fL (ref 79–97)
Monocytes Absolute: 0.5 10*3/uL (ref 0.1–0.9)
Monocytes: 8 %
Neutrophils Absolute: 4 10*3/uL (ref 1.4–7.0)
Neutrophils: 70 %
Platelets: 280 10*3/uL (ref 150–450)
RBC: 4.72 x10E6/uL (ref 3.77–5.28)
RDW: 13.2 % (ref 11.7–15.4)
WBC: 5.8 10*3/uL (ref 3.4–10.8)

## 2022-09-11 LAB — LIPID PANEL
Chol/HDL Ratio: 3.1 ratio (ref 0.0–4.4)
Cholesterol, Total: 150 mg/dL (ref 100–199)
HDL: 48 mg/dL (ref >39)
LDL Chol Calc (NIH): 79 mg/dL (ref 0–99)
Triglycerides: 129 mg/dL (ref 0–149)
VLDL Cholesterol Cal: 23 mg/dL (ref 5–40)

## 2022-09-11 LAB — CMP14+EGFR
ALT: 7 IU/L (ref 0–32)
AST: 9 IU/L (ref 0–40)
Albumin/Globulin Ratio: 1.6 (ref 1.2–2.2)
Albumin: 4.1 g/dL (ref 3.7–4.7)
Alkaline Phosphatase: 113 IU/L (ref 44–121)
BUN/Creatinine Ratio: 17 (ref 12–28)
BUN: 15 mg/dL (ref 8–27)
Bilirubin Total: 0.4 mg/dL (ref 0.0–1.2)
CO2: 26 mmol/L (ref 20–29)
Calcium: 9.3 mg/dL (ref 8.7–10.3)
Chloride: 100 mmol/L (ref 96–106)
Creatinine, Ser: 0.87 mg/dL (ref 0.57–1.00)
Globulin, Total: 2.5 g/dL (ref 1.5–4.5)
Glucose: 105 mg/dL — ABNORMAL HIGH (ref 70–99)
Potassium: 4.3 mmol/L (ref 3.5–5.2)
Sodium: 138 mmol/L (ref 134–144)
Total Protein: 6.6 g/dL (ref 6.0–8.5)
eGFR: 64 mL/min/{1.73_m2} (ref >59)

## 2022-10-18 ENCOUNTER — Ambulatory Visit (INDEPENDENT_AMBULATORY_CARE_PROVIDER_SITE_OTHER): Payer: PPO | Admitting: Family Medicine

## 2022-10-18 ENCOUNTER — Encounter: Payer: Self-pay | Admitting: Family Medicine

## 2022-10-18 VITALS — BP 139/78 | HR 80 | Temp 95.7°F | Ht 65.0 in | Wt 164.2 lb

## 2022-10-18 DIAGNOSIS — M25512 Pain in left shoulder: Secondary | ICD-10-CM | POA: Diagnosis not present

## 2022-10-18 DIAGNOSIS — G8929 Other chronic pain: Secondary | ICD-10-CM

## 2022-10-18 MED ORDER — METHYLPREDNISOLONE ACETATE 40 MG/ML IJ SUSP
40.0000 mg | Freq: Once | INTRAMUSCULAR | Status: AC
Start: 2022-10-18 — End: 2022-10-18
  Administered 2022-10-18: 60 mg via INTRAMUSCULAR

## 2022-10-18 NOTE — Progress Notes (Signed)
Subjective:  Patient ID: Rhonda [name], female    DOB: 1933/04/19, 89 y.o.   MRN: [id number]  Patient Care Team: Dettinger, Elige Radon, MD as PCP - General (Family Medicine) Rollene Rotunda, MD as PCP - Cardiology (Cardiology) Rollene Rotunda, MD as Consulting Physician (Cardiology) Ranee Gosselin, MD as Consulting Physician (Orthopedic Surgery)   Chief Complaint:  Shoulder Pain (Left and would like an injection )   HPI: Rhonda [name] is a 89 y.o. female presenting on 10/18/2022 for Shoulder Pain (Left and would like an injection )   Shoulder Pain  The pain is present in the left shoulder. This is a chronic problem. The current episode started more than 1 year ago. The problem occurs constantly. The problem has been waxing and waning. The quality of the pain is described as aching, burning and dull. The pain is moderate. Associated symptoms include a limited range of motion and stiffness. Pertinent negatives include no fever, inability to bear weight, itching, joint locking, joint swelling, numbness or tingling. The symptoms are aggravated by activity. She has tried acetaminophen and movement for the symptoms. The treatment provided no relief. Her past medical history is significant for osteoarthritis.    Relevant past medical, surgical, family, and social history reviewed and updated as indicated.  Allergies and medications reviewed and updated. Data reviewed: Chart in Epic.   Past Medical History:  Diagnosis Date   Arthritis    Atrial fibrillation (HCC) 05/2015   Cataract    Dysrhythmia    afib   Hypertension    Osteopenia    Syncope 04/2014    Past Surgical History:  Procedure Laterality Date   BREAST EXCISIONAL BIOPSY Left    BREAST EXCISIONAL BIOPSY Right    COLONOSCOPY  2011   KNEE ARTHROCENTESIS Bilateral    KNEE ARTHROSCOPY     bil   POLYPECTOMY  11-01-2009   ROTATOR CUFF REPAIR     SHOULDER SURGERY Left    TOTAL KNEE ARTHROPLASTY Left    TOTAL  KNEE ARTHROPLASTY Right 10/12/2019   Procedure: TOTAL KNEE ARTHROPLASTY;  Surgeon: Ollen Gross, MD;  Location: [facility];  Service: Orthopedics;  Laterality: Right;    VAGINAL HYSTERECTOMY     twice - vaginal and then oopherectomy    Social History   Socioeconomic History   Marital status: Widowed    Spouse name: Not on file   Number of children: 1   Years of education: 12   Highest education level: High school graduate  Occupational History   Occupation: retired  Tobacco Use   Smoking status: Never   Smokeless tobacco: Never  Vaping Use   Vaping Use: Never used  Substance and Sexual Activity   Alcohol use: No    Alcohol/week: 0.0 standard drinks of alcohol   Drug use: No   Sexual activity: Not Currently  Other Topics Concern   Not on file  Social History Narrative     Son lives 20 minutes away   Social Determinants of Health   Financial Resource Strain: Low Risk  (03/05/2022)   Overall Financial Resource Strain (CARDIA)    Difficulty of Paying Living Expenses: Not hard at all  Food Insecurity: No Food Insecurity (03/05/2022)   Hunger Vital Sign    Worried About Running Out of Food in the Last Year: Never true    Ran Out of Food in the Last Year: Never true  Transportation Needs: No Transportation Needs (03/05/2022)   PRAPARE - Transportation  Lack of Transportation (Medical): No    Lack of Transportation (Non-Medical): No  Physical Activity: Sufficiently Active (03/05/2022)   Exercise Vital Sign    Days of Exercise per Week: 5 days    Minutes of Exercise per Session: 30 min  Stress: No Stress Concern Present (03/05/2022)   [employer] of Occupational Health - Occupational Stress Questionnaire    Feeling of Stress : Not at all  Social Connections: Moderately Integrated (03/05/2022)   Social Connection and Isolation Panel [NHANES]    Frequency of Communication with Friends and Family: More than three times a week    Frequency of Social Gatherings with  Friends and Family: More than three times a week    Attends Religious Services: More than 4 times per year    Active Member of [employer] or Organizations: Yes    Attends Banker Meetings: More than 4 times per year    Marital Status: Widowed  Intimate Partner Violence: Not At Risk (03/05/2022)   Humiliation, Afraid, Rape, and Kick questionnaire    Fear of Current or Ex-Partner: No    Emotionally Abused: No    Physically Abused: No    Sexually Abused: No    Outpatient Encounter Medications as of 10/18/2022  Medication Sig   acetaminophen (TYLENOL) 650 MG CR tablet Take 1 tablet (650 mg total) by mouth every 8 (eight) hours as needed for pain. (Patient taking differently: Take 1,300 mg by mouth every 8 (eight) hours as needed for pain.)   cholecalciferol (VITAMIN D3) 25 MCG (1000 UT) tablet Take 2,000 Units by mouth 2 (two) times daily.   diclofenac Sodium (VOLTAREN) 1 % GEL Apply 2 g topically 4 (four) times daily.   diltiazem (TIAZAC) 360 MG 24 hr capsule TAKE ONE (1) CAPSULE EACH DAY   diltiazem (TIAZAC) 360 MG 24 hr capsule TAKE ONE (1) CAPSULE EACH DAY   ELIQUIS 5 MG TABS tablet TAKE ONE TABLET BY MOUTH TWICE DAILY   flecainide (TAMBOCOR) 50 MG tablet TAKE ONE (1) TABLET BY MOUTH TWO (2) TIMES DAILY   Multiple Vitamins-Minerals (PRESERVISION AREDS 2 PO) Take by mouth.   prednisoLONE acetate (PRED FORTE) 1 % ophthalmic suspension    zolpidem (AMBIEN) 5 MG tablet Take 1 tablet (5 mg total) by mouth at bedtime as needed for sleep. Put on file until patient asked for refill   [EXPIRED] methylPREDNISolone acetate (DEPO-MEDROL) injection 40 mg    No facility-administered encounter medications on file as of 10/18/2022.    No Known Allergies  Review of Systems  Constitutional:  Negative for activity change, appetite change, chills, diaphoresis, fatigue, fever and unexpected weight change.  Eyes:  Negative for photophobia and visual disturbance.  Respiratory:  Negative for cough  and shortness of breath.   Cardiovascular:  Negative for chest pain, palpitations and leg swelling.  Genitourinary:  Negative for decreased urine volume and difficulty urinating.  Musculoskeletal:  Positive for arthralgias, joint swelling, myalgias and stiffness.  Skin:  Negative for itching.  Neurological:  Negative for dizziness, tingling, weakness and numbness.  All other systems reviewed and are negative.       Objective:  BP 139/78   Pulse 80   Temp (!) 95.7 F (35.4 C) (Temporal)   Ht 5\' 5"  (1.651 m)   Wt 164 lb 3.2 oz (74.5 kg)   SpO2 95%   BMI 27.32 kg/m    Wt Readings from Last 3 Encounters:  10/18/22 164 lb 3.2 oz (74.5 kg)  09/10/22 161 lb (73  kg)  07/04/22 165 lb (74.8 kg)    Physical Exam Vitals and nursing note reviewed.  Constitutional:      General: She is not in acute distress.    Appearance: Normal appearance. She is not ill-appearing, toxic-appearing or diaphoretic.  HENT:     Head: Normocephalic and atraumatic.     Mouth/Throat:     Mouth: Mucous membranes are moist.  Eyes:     Pupils: Pupils are equal, round, and reactive to light.  Cardiovascular:     Rate and Rhythm: Normal rate. Rhythm irregularly irregular.     Pulses: Normal pulses.     Heart sounds: Normal heart sounds.  Pulmonary:     Effort: Pulmonary effort is normal.     Breath sounds: Normal breath sounds.  Musculoskeletal:     Right shoulder: Normal.     Left shoulder: Tenderness and crepitus present. No swelling, deformity, effusion, laceration or bony tenderness. Decreased range of motion. Normal strength. Normal pulse.     Right upper arm: Normal.     Left upper arm: Normal.     Cervical back: Normal.     Right lower leg: No edema.     Left lower leg: No edema.  Skin:    General: Skin is warm and dry.     Capillary Refill: Capillary refill takes less than 2 seconds.  Neurological:     General: No focal deficit present.     Mental Status: She is alert and oriented to  person, place, and time.  Psychiatric:        Mood and Affect: Mood normal.        Behavior: Behavior normal.        Thought Content: Thought content normal.        Judgment: Judgment normal.     Results for orders placed or performed in visit on 09/10/22  CBC with Differential/Platelet  Result Value Ref Range   WBC 5.8 3.4 - 10.8 x10E3/uL   RBC 4.72 3.77 - 5.28 x10E6/uL   Hemoglobin 13.9 11.1 - 15.9 g/dL   Hematocrit 44.0 34.7 - 46.6 %   MCV 89 79 - 97 fL   MCH 29.4 26.6 - 33.0 pg   MCHC 33.3 31.5 - 35.7 g/dL   RDW 42.5 95.6 - 38.7 %   Platelets 280 150 - 450 x10E3/uL   Neutrophils 70 Not Estab. %   Lymphs 20 Not Estab. %   Monocytes 8 Not Estab. %   Eos 1 Not Estab. %   Basos 1 Not Estab. %   Neutrophils Absolute 4.0 1.4 - 7.0 x10E3/uL   Lymphocytes Absolute 1.2 0.7 - 3.1 x10E3/uL   Monocytes Absolute 0.5 0.1 - 0.9 x10E3/uL   EOS (ABSOLUTE) 0.1 0.0 - 0.4 x10E3/uL   Basophils Absolute 0.1 0.0 - 0.2 x10E3/uL   Immature Granulocytes 0 Not Estab. %   Immature Grans (Abs) 0.0 0.0 - 0.1 x10E3/uL  CMP14+EGFR  Result Value Ref Range   Glucose 105 (H) 70 - 99 mg/dL   BUN 15 8 - 27 mg/dL   Creatinine, Ser 5.64 0.57 - 1.00 mg/dL   eGFR 64 >33 IR/JJO/8.41   BUN/Creatinine Ratio 17 12 - 28   Sodium 138 134 - 144 mmol/L   Potassium 4.3 3.5 - 5.2 mmol/L   Chloride 100 96 - 106 mmol/L   CO2 26 20 - 29 mmol/L   Calcium 9.3 8.7 - 10.3 mg/dL   Total Protein 6.6 6.0 - 8.5 g/dL   Albumin 4.1  3.7 - 4.7 g/dL   Globulin, Total 2.5 1.5 - 4.5 g/dL   Albumin/Globulin Ratio 1.6 1.2 - 2.2   Bilirubin Total 0.4 0.0 - 1.2 mg/dL   Alkaline Phosphatase 113 44 - 121 IU/L   AST 9 0 - 40 IU/L   ALT 7 0 - 32 IU/L  Lipid panel  Result Value Ref Range   Cholesterol, Total 150 100 - 199 mg/dL   Triglycerides 409 0 - 149 mg/dL   HDL 48 >81 mg/dL   VLDL Cholesterol Cal 23 5 - 40 mg/dL   LDL Chol Calc (NIH) 79 0 - 99 mg/dL   Chol/HDL Ratio 3.1 0.0 - 4.4 ratio     Joint  Injection/Arthrocentesis  Date/Time: 10/18/2022 10:07 AM  Performed by: Sonny Masters, FNP Authorized by: Sonny Masters, FNP  Indications: joint swelling and pain  Body area: shoulder Joint: left shoulder Local anesthesia used: yes  Anesthesia: Local anesthesia used: yes Local Anesthetic: co-phenylcaine spray  Sedation: Patient sedated: no  Preparation: Patient was prepped and draped in the usual sterile fashion. Needle size: 22 G Ultrasound guidance: no Approach: posterior Aspirate amount: 0 mL Methylprednisolone amount: 60 mg Lidocaine 2% amount: 3.5 mL Patient tolerance: patient tolerated the procedure well with no immediate complications      Pertinent labs & imaging results that were available during my care of the patient were reviewed by me and considered in my medical decision making.  Assessment & Plan:  Rhonda [name] was seen today for shoulder pain.  Diagnoses and all orders for this visit:  Chronic left shoulder pain Last joint injection 06/2022. No new injuries. Joint injected today for symptom relief. Aware to report new, worsening, or persistent symptoms.  -     methylPREDNISolone acetate (DEPO-MEDROL) injection 40 mg -     Joint Injection/Arthrocentesis     Continue all other maintenance medications.  Follow up plan: Return if symptoms worsen or fail to improve.   Continue healthy lifestyle choices, including diet (rich in fruits, vegetables, and lean proteins, and low in salt and simple carbohydrates) and exercise (at least 30 minutes of moderate physical activity daily).  Educational handout given for joint steroid injection   The above assessment and management plan was discussed with the patient. The patient verbalized understanding of and has agreed to the management plan. Patient is aware to call the clinic if they develop any new symptoms or if symptoms persist or worsen. Patient is aware when to return to the clinic for a follow-up visit. Patient  educated on when it is appropriate to go to the emergency department.   Kari Baars, FNP-C [facility] [phone] '

## 2022-11-07 ENCOUNTER — Other Ambulatory Visit: Payer: Self-pay | Admitting: Cardiology

## 2022-11-07 DIAGNOSIS — I4819 Other persistent atrial fibrillation: Secondary | ICD-10-CM

## 2022-11-07 NOTE — Telephone Encounter (Signed)
Prescription refill request for Eliquis received. Indication: Afib  Last office visit: 01/17/22 (Hochrein)  Scr: 0.87 (09/10/22) Age: 89 Weight: 74.5kg  Appropriate dose. Refill sent.

## 2022-12-10 ENCOUNTER — Other Ambulatory Visit: Payer: Self-pay | Admitting: Family Medicine

## 2022-12-10 DIAGNOSIS — Z1231 Encounter for screening mammogram for malignant neoplasm of breast: Secondary | ICD-10-CM

## 2023-01-24 DIAGNOSIS — H40033 Anatomical narrow angle, bilateral: Secondary | ICD-10-CM | POA: Diagnosis not present

## 2023-01-24 DIAGNOSIS — H43393 Other vitreous opacities, bilateral: Secondary | ICD-10-CM | POA: Diagnosis not present

## 2023-01-28 ENCOUNTER — Ambulatory Visit
Admission: RE | Admit: 2023-01-28 | Discharge: 2023-01-28 | Disposition: A | Discharge status: Home / Self Care | Payer: PPO | Source: Ambulatory Visit | Attending: Family Medicine | Admitting: Family Medicine

## 2023-01-28 DIAGNOSIS — Z1231 Encounter for screening mammogram for malignant neoplasm of breast: Secondary | ICD-10-CM

## 2023-02-04 ENCOUNTER — Other Ambulatory Visit: Payer: Self-pay | Admitting: Family Medicine

## 2023-02-04 DIAGNOSIS — F5101 Primary insomnia: Secondary | ICD-10-CM

## 2023-02-12 DIAGNOSIS — R002 Palpitations: Secondary | ICD-10-CM | POA: Insufficient documentation

## 2023-02-12 NOTE — Progress Notes (Unsigned)
Cardiology Office Note:   Date:  02/13/2023  ID:  Rhonda [name], DOB Jan 23, 1933, MRN [id number] PCP: Dettinger, Elige Radon, MD  [facility] Providers Cardiologist:  Rollene Rotunda, MD {  History of Present Illness:   Rhonda [name] is a 89 y.o. female with atrial fib.  She was in the hospital in Jan with this.  She was treated with flecainide, Eliquis and Diltiazem.   In Feb she had a POET (Plain Old Exercise Treadmill). In late June 2020 she was in the ED with SVT. She was treated with adenosine.   She has had lower extremity swelling.  She had no evidence of a DVT.   I treated her with Lasix.     She is describing episodes of lightheadedness when she stands up.  She says it feels little bit like her vertigo.  She feels some dizziness.  She has not had any frank syncope.  She does not get this when she is sitting or when she is lying down.  She does not feel her heart racing or skipping.  She has no new shortness of breath, PND or orthopnea.  She has had no weight gain or edema.  She has had no chest pressure, neck or arm discomfort.  ROS: As stated in the HPI and negative for all other systems.  Studies Reviewed:    EKG:   EKG Interpretation Date/Time:  Wednesday February 13 2023 15:32:49 EDT Ventricular Rate:  57 PR Interval:  200 QRS Duration:  98 QT Interval:  458 QTC Calculation: 445 R Axis:   -36  Text Interpretation: Sinus bradycardia with sinus arrhythmia Left axis deviation When compared with ECG of 05-Oct-2019 09:04, No significant change was found Confirmed by Rollene Rotunda ([id number]) on 02/13/2023 3:57:12 PM    Risk Assessment/Calculations:    CHA2DS2-VASc Score = 4   This indicates a 4.8% annual risk of stroke. The patient's score is based upon: CHF History: 0 HTN History: 1 Diabetes History: 0 Stroke History: 0 Vascular Disease History: 0 Age Score: 2 Gender Score: 1   Physical Exam:   VS:  BP 120/62   Pulse (!) 57   Ht 5\' 5"  (1.651 m)   Wt  163 lb (73.9 kg)   BMI 27.12 kg/m    Wt Readings from Last 3 Encounters:  02/13/23 163 lb (73.9 kg)  10/18/22 164 lb 3.2 oz (74.5 kg)  09/10/22 161 lb (73 kg)     GEN: Well nourished, well developed in no acute distress NECK: No JVD; No carotid bruits CARDIAC: RRR, no murmurs, rubs, gallops RESPIRATORY:  Clear to auscultation without rales, wheezing or rhonchi  ABDOMEN: Soft, non-tender, non-distended EXTREMITIES:  Mild anklle edema; No deformity   ASSESSMENT AND PLAN:   Persistent atrial fibrillation:    She tolerates anticoagulation.  She has had no symptomatic paroxysms.  No change in therapy.  Dizziness: Sounds like she is having orthostatic symptoms at this point.  I am going to reduce her Cardizem to 300 mg daily and see if this helps.  She will call me and let me know if she does not have improvement.  Hypertension: The blood pressure is at target.  No change in therapy.    Swelling:    This is chronic and mild and she has zip up compression stockings.  No change in therapy.     Follow up with me in 3 months.   Signed, Rollene Rotunda, MD

## 2023-02-13 ENCOUNTER — Ambulatory Visit: Payer: PPO | Admitting: Cardiology

## 2023-02-13 ENCOUNTER — Encounter: Payer: Self-pay | Admitting: Cardiology

## 2023-02-13 VITALS — BP 120/62 | HR 57 | Ht 65.0 in | Wt 163.0 lb

## 2023-02-13 DIAGNOSIS — R002 Palpitations: Secondary | ICD-10-CM

## 2023-02-13 DIAGNOSIS — I10 Essential (primary) hypertension: Secondary | ICD-10-CM

## 2023-02-13 DIAGNOSIS — M7989 Other specified soft tissue disorders: Secondary | ICD-10-CM | POA: Diagnosis not present

## 2023-02-13 DIAGNOSIS — I471 Supraventricular tachycardia, unspecified: Secondary | ICD-10-CM

## 2023-02-13 DIAGNOSIS — I4811 Longstanding persistent atrial fibrillation: Secondary | ICD-10-CM | POA: Diagnosis not present

## 2023-02-13 MED ORDER — DILTIAZEM HCL ER BEADS 300 MG PO CP24: 300.0000 mg | ORAL_CAPSULE | Freq: Every day | ORAL | 3 refills | Status: DC

## 2023-02-13 NOTE — Patient Instructions (Signed)
Medication Instructions:  Please decrease your Diltiazem to 300 mg once a day. Continue all other medications as listed.  *If you need a refill on your cardiac medications before your next appointment, please call your pharmacy*   Follow-Up: At [facility], you and your health needs are our priority.  As part of our continuing mission to provide you with exceptional heart care, we have created designated Provider Care Teams.  These Care Teams include your primary Cardiologist (physician) and Advanced Practice Providers (APPs -  Physician Assistants and Nurse Practitioners) who all work together to provide you with the care you need, when you need it.  We recommend signing up for the patient portal called "MyChart".  Sign up information is provided on this After Visit Summary.  MyChart is used to connect with patients for Virtual Visits (Telemedicine).  Patients are able to view lab/test results, encounter notes, upcoming appointments, etc.  Non-urgent messages can be sent to your provider as well.   To learn more about what you can do with MyChart, go to [email or web address].    Your next appointment:   3 month(s)  Provider:   Rollene Rotunda, MD

## 2023-03-07 ENCOUNTER — Ambulatory Visit: Payer: PPO

## 2023-03-07 VITALS — Ht 65.0 in | Wt 163.0 lb

## 2023-03-07 DIAGNOSIS — Z0000 Encounter for general adult medical examination without abnormal findings: Secondary | ICD-10-CM | POA: Diagnosis not present

## 2023-03-07 NOTE — Progress Notes (Signed)
Subjective:   Rhonda [name] is a 89 y.o. female who presents for Medicare Annual (Subsequent) preventive examination.  Visit Complete: Virtual I connected with  Kindal Ohagan Selbe on 03/07/23 by a audio enabled telemedicine application and verified that I am speaking with the correct person using two identifiers.  Patient Location: Home  Provider Location: Home Office  I discussed the limitations of evaluation and management by telemedicine. The patient expressed understanding and agreed to proceed.  Vital Signs: Because this visit was a virtual/telehealth visit, some criteria may be missing or patient reported. Any vitals not documented were not able to be obtained and vitals that have been documented are patient reported.  Patient Medicare AWV questionnaire was completed by the patient on 03/07/2023; I have confirmed that all information answered by patient is correct and no changes since this date.  Cardiac Risk Factors include: advanced age (>33men, >38 women);dyslipidemia;hypertension     Objective:    Today's Vitals   03/07/23 0800  Weight: 163 lb (73.9 kg)  Height: 5\' 5"  (1.651 m)   Body mass index is 27.12 kg/m.     03/07/2023    8:07 AM 03/05/2022    8:24 AM 02/17/2021    8:37 AM 02/17/2020    8:49 AM 10/12/2019    5:03 PM 09/28/2019   11:10 AM 02/16/2019    8:43 AM  Advanced Directives  Does Patient Have a Medical Advance Directive? Yes No Yes Yes No No Yes  Type of Estate agent of [place];Living will  Healthcare Power of [employer] Power of [employer] Power of Attorney  Does patient want to make changes to medical advance directive?    No - Patient declined No - Patient declined No - Patient declined No - Patient declined  Copy of Healthcare Power of Attorney in Chart? No - copy requested  No - copy requested No - copy requested   No - copy requested  Would patient like information on creating a medical advance  directive?  No - Patient declined   No - Patient declined      Current Medications (verified) Outpatient Encounter Medications as of 03/07/2023  Medication Sig   acetaminophen (TYLENOL) 650 MG CR tablet Take 1 tablet (650 mg total) by mouth every 8 (eight) hours as needed for pain. (Patient taking differently: Take 1,300 mg by mouth every 8 (eight) hours as needed for pain.)   cholecalciferol (VITAMIN D3) 25 MCG (1000 UT) tablet Take 2,000 Units by mouth 2 (two) times daily.   diclofenac Sodium (VOLTAREN) 1 % GEL Apply 2 g topically 4 (four) times daily.   diltiazem (TIAZAC) 300 MG 24 hr capsule Take 1 capsule (300 mg total) by mouth daily.   ELIQUIS 5 MG TABS tablet TAKE ONE TABLET BY MOUTH TWICE DAILY   flecainide (TAMBOCOR) 50 MG tablet TAKE ONE (1) TABLET BY MOUTH TWO (2) TIMES DAILY   zolpidem (AMBIEN) 5 MG tablet Take 1 tablet (5 mg total) by mouth at bedtime as needed for sleep. Put on file until patient asked for refill   Multiple Vitamins-Minerals (PRESERVISION AREDS 2 PO) Take by mouth. (Patient not taking: Reported on 03/07/2023)   No facility-administered encounter medications on file as of 03/07/2023.    Allergies (verified) Patient has no known allergies.   History: Past Medical History:  Diagnosis Date   Arthritis    Atrial fibrillation (HCC) 05/2015   Cataract    Dysrhythmia    afib   Hypertension  Osteopenia    Syncope 04/2014   Past Surgical History:  Procedure Laterality Date   BREAST EXCISIONAL BIOPSY Left    BREAST EXCISIONAL BIOPSY Right    COLONOSCOPY  2011   KNEE ARTHROCENTESIS Bilateral    KNEE ARTHROSCOPY     bil   POLYPECTOMY  11-01-2009   ROTATOR CUFF REPAIR     SHOULDER SURGERY Left    TOTAL KNEE ARTHROPLASTY Left    TOTAL KNEE ARTHROPLASTY Right 10/12/2019   Procedure: TOTAL KNEE ARTHROPLASTY;  Surgeon: Ollen Gross, MD;  Location: [facility];  Service: Orthopedics;  Laterality: Right;    VAGINAL HYSTERECTOMY     twice - vaginal and  then oopherectomy   Family History  Problem Relation Age of Onset   Heart failure Mother    Hypertension Father    Arthritis Father    Hypertension Sister    Arthritis Sister    Cancer Brother    Bladder Cancer Brother    Hypertension Brother    Diabetes Brother    Social History   Socioeconomic History   Marital status: Widowed    Spouse name: Not on file   Number of children: 1   Years of education: 12   Highest education level: High school graduate  Occupational History   Occupation: retired  Tobacco Use   Smoking status: Never   Smokeless tobacco: Never  Vaping Use   Vaping status: Never Used  Substance and Sexual Activity   Alcohol use: No    Alcohol/week: 0.0 standard drinks of alcohol   Drug use: No   Sexual activity: Not Currently  Other Topics Concern   Not on file  Social History Narrative     Son lives 20 minutes away   Social Determinants of Health   Financial Resource Strain: Low Risk  (03/07/2023)   Overall Financial Resource Strain (CARDIA)    Difficulty of Paying Living Expenses: Not hard at all  Food Insecurity: No Food Insecurity (03/07/2023)   Hunger Vital Sign    Worried About Running Out of Food in the Last Year: Never true    Ran Out of Food in the Last Year: Never true  Transportation Needs: No Transportation Needs (03/07/2023)   PRAPARE - Administrator, Civil Service (Medical): No    Lack of Transportation (Non-Medical): No  Physical Activity: Insufficiently Active (03/07/2023)   Exercise Vital Sign    Days of Exercise per Week: 3 days    Minutes of Exercise per Session: 30 min  Stress: No Stress Concern Present (03/07/2023)   [employer] of Occupational Health - Occupational Stress Questionnaire    Feeling of Stress : Not at all  Social Connections: Moderately Integrated (03/07/2023)   Social Connection and Isolation Panel [NHANES]    Frequency of Communication with Friends and Family: More than three times a week     Frequency of Social Gatherings with Friends and Family: More than three times a week    Attends Religious Services: More than 4 times per year    Active Member of [employer] or Organizations: Yes    Attends Banker Meetings: 1 to 4 times per year    Marital Status: Widowed    Tobacco Counseling Counseling given: Not Answered   Clinical Intake:  Pre-visit preparation completed: Yes  Pain : No/denies pain     Nutritional Risks: None Diabetes: No  How often do you need to have someone help you when you read instructions, pamphlets, or other written  materials from your doctor or pharmacy?: 1 - Never  Interpreter Needed?: No  Information entered by :: Renie Ora, LPN   Activities of Daily Living    03/07/2023    8:07 AM  In your present state of health, do you have any difficulty performing the following activities:  Hearing? 0  Vision? 0  Difficulty concentrating or making decisions? 0  Walking or climbing stairs? 0  Dressing or bathing? 0  Doing errands, shopping? 0  Preparing Food and eating ? N  Using the Toilet? N  In the past six months, have you accidently leaked urine? N  Do you have problems with loss of bowel control? N  Managing your Medications? N  Managing your Finances? N  Housekeeping or managing your Housekeeping? N    Patient Care Team: Dettinger, Elige Radon, MD as PCP - General (Family Medicine) Rollene Rotunda, MD as PCP - Cardiology (Cardiology) Rollene Rotunda, MD as Consulting Physician (Cardiology) Ranee Gosselin, MD as Consulting Physician (Orthopedic Surgery)  Indicate any recent Medical Services you may have received from other than [facility] providers in the past year (date may be approximate).     Assessment:   This is a routine wellness examination for Rhonda [name].  Hearing/Vision screen Vision Screening - Comments:: Wears rx glasses - up to date with routine eye exams with  Dr.Le    Goals Addressed             This  Visit's Progress    DIET - INCREASE WATER INTAKE   On track    Try to drink 6-8 glasses of water daily       Depression Screen    03/07/2023    8:04 AM 09/10/2022    8:10 AM 03/12/2022   10:05 AM 03/05/2022    8:22 AM 01/23/2022    3:23 PM 01/03/2022    9:23 AM 12/26/2021    9:50 AM  PHQ 2/9 Scores  PHQ - 2 Score 0 0 0 0 0 0 0  PHQ- 9 Score   0 0 0 0 0    Fall Risk    03/07/2023    8:01 AM 09/10/2022    8:10 AM 03/12/2022   10:05 AM 03/05/2022    8:19 AM 01/23/2022    3:21 PM  Fall Risk   Falls in the past year? 0 0 0 0 0  Number falls in past yr: 0   0   Injury with Fall? 0   0   Risk for fall due to : No Fall Risks   No Fall Risks   Follow up Falls prevention discussed   Falls prevention discussed     MEDICARE RISK AT HOME: Medicare Risk at Home Any stairs in or around the home?: Yes If so, are there any without handrails?: No Home free of loose throw rugs in walkways, pet beds, electrical cords, etc?: Yes Adequate lighting in your home to reduce risk of falls?: Yes Life alert?: No Use of a cane, walker or w/c?: No Grab bars in the bathroom?: Yes Shower chair or bench in shower?: No Elevated toilet seat or a handicapped toilet?: No  TIMED UP AND GO:  Was the test performed?  No    Cognitive Function:    02/11/2018    9:16 AM 09/24/2016    8:28 AM  MMSE - Mini Mental State Exam  Orientation to time 5 5  Orientation to Place 5 5  Registration 3 3  Attention/ Calculation 5 5  Recall  3 3  Language- name 2 objects 2 2  Language- repeat 1 1  Language- follow 3 step command 3 3  Language- read & follow direction 1 1  Write a sentence 1 1  Copy design 1 1  Total score 30 30        03/07/2023    8:07 AM 03/05/2022    8:24 AM 02/17/2020    8:47 AM 02/16/2019    8:47 AM  6CIT Screen  What Year? 0 points 0 points 0 points 0 points  What month? 0 points 0 points 0 points 0 points  What time? 0 points 0 points 0 points 0 points  Count back from 20 0  points 0 points 0 points 0 points  Months in reverse 0 points 0 points 0 points 0 points  Repeat phrase 0 points 0 points 0 points 2 points  Total Score 0 points 0 points 0 points 2 points    Immunizations Immunization History  Administered Date(s) Administered   Fluad Quad(high Dose 65+) 03/19/2016, 02/27/2019, 03/12/2022   Fluad Trivalent(High Dose 65+) 02/18/2023   Influenza, High Dose Seasonal PF 03/19/2016, 02/27/2017, 02/11/2018   Influenza, Seasonal, Injecte, Preservative Fre 03/23/2015   Influenza,inj,Quad PF,6+ Mos 03/23/2015, 03/23/2015, 02/16/2021   Influenza,inj,quad, With Preservative 01/21/2017   Influenza,trivalent, recombinat, inj, PF 03/11/2014   Influenza-Unspecified 02/13/2006, 03/11/2014   Moderna Covid-19 Fall Seasonal Vaccine 80yrs & older 05/10/2022, 02/18/2023   Moderna Covid-19 Vaccine Bivalent Booster 51yrs & up 04/04/2021   Moderna Sars-Covid-2 Vaccination 05/27/2019, 06/25/2019, 09/13/2020   PFIZER(Purple Top)SARS-COV-2 Vaccination 02/24/2020   Pneumococcal Conjugate-13 09/24/2016   Pneumococcal Polysaccharide-23 11/18/2017   Zoster Recombinant(Shingrix) 09/08/2021, 09/10/2022    TDAP status: Due, Education has been provided regarding the importance of this vaccine. Advised may receive this vaccine at local pharmacy or Health Dept. Aware to provide a copy of the vaccination record if obtained from local pharmacy or Health Dept. Verbalized acceptance and understanding.  Flu Vaccine status: Up to date  Pneumococcal vaccine status: Up to date  Covid-19 vaccine status: Completed vaccines  Qualifies for Shingles Vaccine? Yes   Zostavax completed Yes   Shingrix Completed?: Yes  Screening Tests Health Maintenance  Topic Date Due   DTaP/Tdap/Td (1 - Tdap) Never done   Medicare Annual Wellness (AWV)  03/06/2024   DEXA SCAN  03/12/2024   Pneumonia Vaccine 56+ Years old  Completed   INFLUENZA VACCINE  Completed   COVID-19 Vaccine  Completed   Zoster  Vaccines- Shingrix  Completed   HPV VACCINES  Aged Out    Health Maintenance  Health Maintenance Due  Topic Date Due   DTaP/Tdap/Td (1 - Tdap) Never done    Colorectal cancer screening: No longer required.   Mammogram status: No longer required due to age.  Bone Density status: Completed 03/12/2022. Results reflect: Bone density results: OSTEOPOROSIS. Repeat every 2 years.  Lung Cancer Screening: (Low Dose CT Chest recommended if Age 48-80 years, 20 pack-year currently smoking OR have quit w/in 15years.) does not qualify.   Lung Cancer Screening Referral: n/a  Additional Screening:  Hepatitis C Screening: does not qualify;  Vision Screening: Recommended annual ophthalmology exams for early detection of glaucoma and other disorders of the eye. Is the patient up to date with their annual eye exam?  Yes  Who is the provider or what is the name of the office in which the patient attends annual eye exams? Dr. Conley Rolls  If pt is not established with a provider, would they like to  be referred to a provider to establish care? No .   Dental Screening: Recommended annual dental exams for proper oral hygiene   Community Resource Referral / Chronic Care Management: CRR required this visit?  No   CCM required this visit?  No     Plan:     I have personally reviewed and noted the following in the patient's chart:   Medical and social history Use of alcohol, tobacco or illicit drugs  Current medications and supplements including opioid prescriptions. Patient is not currently taking opioid prescriptions. Functional ability and status Nutritional status Physical activity Advanced directives List of other physicians Hospitalizations, surgeries, and ER visits in previous 12 months Vitals Screenings to include cognitive, depression, and falls Referrals and appointments  In addition, I have reviewed and discussed with patient certain preventive protocols, quality metrics, and best  practice recommendations. A written personalized care plan for preventive services as well as general preventive health recommendations were provided to patient.     Lorrene Reid, LPN   [id number]   After Visit Summary: (MyChart) Due to this being a telephonic visit, the after visit summary with patients personalized plan was offered to patient via MyChart   Nurse Notes: none

## 2023-03-07 NOTE — Patient Instructions (Signed)
Rhonda [name] , Thank you for taking time to come for your Medicare Wellness Visit. I appreciate your ongoing commitment to your health goals. Please review the following plan we discussed and let me know if I can assist you in the future.   Referrals/Orders/Follow-Ups/Clinician Recommendations: Aim for 30 minutes of exercise or brisk walking, 6-8 glasses of water, and 5 servings of fruits and vegetables each day.   This is a list of the screening recommended for you and due dates:  Health Maintenance  Topic Date Due   DTaP/Tdap/Td vaccine (1 - Tdap) Never done   Medicare Annual Wellness Visit  03/06/2024   DEXA scan (bone density measurement)  03/12/2024   Pneumonia Vaccine  Completed   Flu Shot  Completed   COVID-19 Vaccine  Completed   Zoster (Shingles) Vaccine  Completed   HPV Vaccine  Aged Out    Advanced directives: (Copy Requested) Please bring a copy of your health care power of attorney and living will to the office to be added to your chart at your convenience.  Next Medicare Annual Wellness Visit scheduled for next year: Yes  Insert Preventive Care attachment Insert FALL PREVENTION attachment if needed

## 2023-03-13 ENCOUNTER — Ambulatory Visit: Payer: PPO | Admitting: Family Medicine

## 2023-03-13 ENCOUNTER — Encounter: Payer: Self-pay | Admitting: Family Medicine

## 2023-03-13 VITALS — BP 142/77 | HR 71 | Ht 65.0 in | Wt 162.0 lb

## 2023-03-13 DIAGNOSIS — I10 Essential (primary) hypertension: Secondary | ICD-10-CM | POA: Diagnosis not present

## 2023-03-13 DIAGNOSIS — R7989 Other specified abnormal findings of blood chemistry: Secondary | ICD-10-CM | POA: Diagnosis not present

## 2023-03-13 DIAGNOSIS — I4811 Longstanding persistent atrial fibrillation: Secondary | ICD-10-CM

## 2023-03-13 DIAGNOSIS — M19012 Primary osteoarthritis, left shoulder: Secondary | ICD-10-CM

## 2023-03-13 DIAGNOSIS — F5101 Primary insomnia: Secondary | ICD-10-CM | POA: Diagnosis not present

## 2023-03-13 MED ORDER — FLECAINIDE ACETATE 50 MG PO TABS
ORAL_TABLET | ORAL | 1 refills | Status: DC
Start: 2023-03-13 — End: 2023-10-29

## 2023-03-13 MED ORDER — DICLOFENAC SODIUM 1 % EX GEL: 2.0000 g | Freq: Four times a day (QID) | CUTANEOUS | 5 refills | Status: AC

## 2023-03-13 MED ORDER — METHYLPREDNISOLONE ACETATE 80 MG/ML IJ SUSP
80.0000 mg | Freq: Once | INTRAMUSCULAR | Status: AC
  Administered 2023-03-13: 80 mg via INTRA_ARTICULAR

## 2023-03-13 MED ORDER — ZOLPIDEM TARTRATE 5 MG PO TABS
5.0000 mg | ORAL_TABLET | Freq: Every evening | ORAL | 5 refills | Status: DC | PRN
Start: 2023-03-13 — End: 2023-09-11

## 2023-03-13 NOTE — Progress Notes (Signed)
BP (!) 142/77   Pulse 71   Ht 5\' 5"  (1.651 m)   Wt 162 lb (73.5 kg)   SpO2 96%   BMI 26.96 kg/m    Subjective:   Patient ID: Rhonda [name], female    DOB: 1933-03-25, 89 y.o.   MRN: [id number]  HPI: Rhonda [name] is a 89 y.o. female presenting on 03/13/2023 for Medical Management of Chronic Issues, Hypertension, and Insomnia   HPI Hypertension and A-fib recheck, also sees cardiology Patient is currently on diltiazem and flecainide, and their blood pressure today is 142/77, she is feeling a lot better, she was having some lightheadedness and dizziness so they lowered her diltiazem and she is feeling a lot better.. Patient denies any lightheadedness or dizziness. Patient denies headaches, blurred vision, chest pains, shortness of breath, or weakness. Denies any side effects from medication and is content with current medication.   Insomnia recheck Current rx-Ambien 5 mg nightly as needed # meds rx-30/month Effectiveness of current meds-works well Adverse reactions form meds-none Pill count performed-No Last drug screen -03/23/2022 ( high risk q61m, moderate risk q32m, low risk yearly ) Urine drug screen today- Yes Was the N[facility] reviewed-yes  If yes were their any concerning findings? -None Controlled substance contract signed on: Today  Vitamin D deficiency recheck Patient is coming in for recheck vitamin D deficiency.  She is currently on treatment for osteoporosis with supplements only and takes vitamin D.  Patient is also having shoulder problems in her left shoulder and the muscles towards her neck.  She had an injection done about 6 months ago and said it helped significantly and she would like to try it again.  Relevant past medical, surgical, family and social history reviewed and updated as indicated. Interim medical history since our last visit reviewed. Allergies and medications reviewed and updated.  Review of Systems  Constitutional:  Negative for chills  and fever.  HENT:  Negative for congestion, ear discharge and ear pain.   Eyes:  Negative for redness and visual disturbance.  Respiratory:  Negative for chest tightness and shortness of breath.   Cardiovascular:  Negative for chest pain and leg swelling.  Genitourinary:  Negative for difficulty urinating and dysuria.  Musculoskeletal:  Positive for arthralgias and myalgias. Negative for back pain and gait problem.  Skin:  Negative for rash.  Neurological:  Negative for dizziness, light-headedness and headaches.  Psychiatric/Behavioral:  Negative for agitation and behavioral problems.   All other systems reviewed and are negative.   Per HPI unless specifically indicated above   Allergies as of 03/13/2023   No Known Allergies      Medication List        Accurate as of March 13, 2023  9:54 AM. If you have any questions, ask your nurse or doctor.          acetaminophen 650 MG CR tablet Commonly known as: TYLENOL Take 1 tablet (650 mg total) by mouth every 8 (eight) hours as needed for pain. What changed: how much to take   cholecalciferol 25 MCG (1000 UNIT) tablet Commonly known as: VITAMIN D3 Take 2,000 Units by mouth 2 (two) times daily.   diclofenac Sodium 1 % Gel Commonly known as: Voltaren Apply 2 g topically 4 (four) times daily.   diltiazem 300 MG 24 hr capsule Commonly known as: TIAZAC Take 1 capsule (300 mg total) by mouth daily.   Eliquis 5 MG Tabs tablet Generic drug: apixaban TAKE ONE TABLET BY MOUTH TWICE  DAILY   flecainide 50 MG tablet Commonly known as: TAMBOCOR TAKE ONE (1) TABLET BY MOUTH TWO (2) TIMES DAILY   PRESERVISION AREDS 2 PO Take by mouth.   zolpidem 5 MG tablet Commonly known as: AMBIEN Take 1 tablet (5 mg total) by mouth at bedtime as needed for sleep. Put on file until patient asked for refill         Objective:   BP (!) 142/77   Pulse 71   Ht 5\' 5"  (1.651 m)   Wt 162 lb (73.5 kg)   SpO2 96%   BMI 26.96 kg/m    Wt Readings from Last 3 Encounters:  03/13/23 162 lb (73.5 kg)  03/07/23 163 lb (73.9 kg)  02/13/23 163 lb (73.9 kg)    Physical Exam Vitals and nursing note reviewed.  Constitutional:      General: She is not in acute distress.    Appearance: She is well-developed. She is not diaphoretic.  Eyes:     Conjunctiva/sclera: Conjunctivae normal.  Cardiovascular:     Rate and Rhythm: Normal rate and regular rhythm.     Heart sounds: Normal heart sounds. No murmur heard. Pulmonary:     Effort: Pulmonary effort is normal. No respiratory distress.     Breath sounds: Normal breath sounds. No wheezing.  Musculoskeletal:        General: No tenderness. Normal range of motion.  Skin:    General: Skin is warm and dry.     Findings: No rash.  Neurological:     Mental Status: She is alert and oriented to person, place, and time.     Coordination: Coordination normal.  Psychiatric:        Behavior: Behavior normal.    Left subacromial injection: Consent form signed. Risk factors of bleeding and infection discussed with patient and patient is agreeable towards injection. Patient prepped with Betadine.  Posterior approach towards injection used. Injected 80 mg of Depo-Medrol and 1 mL of 2% lidocaine. Patient tolerated procedure well and no side effects from noted. Minimal to no bleeding. Simple bandage applied after.    Assessment & Plan:   Problem List Items Addressed This Visit       Cardiovascular and Mediastinum   Atrial fibrillation (HCC)   Relevant Medications   flecainide (TAMBOCOR) 50 MG tablet   Other Relevant Orders   CBC with Differential/Platelet   Lipid panel   Essential hypertension - Primary   Relevant Medications   flecainide (TAMBOCOR) 50 MG tablet   Other Relevant Orders   CBC with Differential/Platelet   CMP14+EGFR   Lipid panel     Other   Insomnia   Relevant Medications   zolpidem (AMBIEN) 5 MG tablet   Other Relevant Orders   CBC with  Differential/Platelet   CMP14+EGFR   Low serum vitamin D   Relevant Orders   VITAMIN D 25 Hydroxy (Vit-D Deficiency, Fractures)   Other Visit Diagnoses     Arthritis of left shoulder region       Relevant Medications   methylPREDNISolone acetate (DEPO-MEDROL) injection 80 mg       Continue current medicine, will do drug screen today.  Also will do blood work today.  Did steroid injection of her left shoulder and she seemed to do well with it. Follow up plan: Return in about 6 months (around 09/10/2023), or if symptoms worsen or fail to improve, for Hypertension and A-fib insomnia recheck.  Counseling provided for all of the vaccine components Orders Placed This  Encounter  Procedures   VITAMIN D 25 Hydroxy (Vit-D Deficiency, Fractures)   CBC with Differential/Platelet   CMP14+EGFR   Lipid panel    Arville Care, MD [facility] 03/13/2023, 9:54 AM

## 2023-03-13 NOTE — Addendum Note (Signed)
Addended by: Dorene Sorrow on: 03/13/2023 11:10 AM   Modules accepted: Orders

## 2023-03-14 LAB — CMP14+EGFR
ALT: 8 [IU]/L (ref 0–32)
AST: 11 [IU]/L (ref 0–40)
Albumin: 4.1 g/dL (ref 3.7–4.7)
Alkaline Phosphatase: 114 [IU]/L (ref 44–121)
BUN/Creatinine Ratio: 14 (ref 12–28)
BUN: 12 mg/dL (ref 8–27)
Bilirubin Total: 0.4 mg/dL (ref 0.0–1.2)
CO2: 24 mmol/L (ref 20–29)
Calcium: 9.3 mg/dL (ref 8.7–10.3)
Chloride: 100 mmol/L (ref 96–106)
Creatinine, Ser: 0.86 mg/dL (ref 0.57–1.00)
Globulin, Total: 2.6 g/dL (ref 1.5–4.5)
Glucose: 86 mg/dL (ref 70–99)
Potassium: 4.4 mmol/L (ref 3.5–5.2)
Sodium: 139 mmol/L (ref 134–144)
Total Protein: 6.7 g/dL (ref 6.0–8.5)
eGFR: 65 mL/min/{1.73_m2} (ref >59)

## 2023-03-14 LAB — CBC WITH DIFFERENTIAL/PLATELET
Basophils Absolute: 0.1 10*3/uL (ref 0.0–0.2)
Basos: 2 %
EOS (ABSOLUTE): 0.1 10*3/uL (ref 0.0–0.4)
Eos: 1 %
Hematocrit: 43.8 % (ref 34.0–46.6)
Hemoglobin: 13.9 g/dL (ref 11.1–15.9)
Immature Grans (Abs): 0 10*3/uL (ref 0.0–0.1)
Immature Granulocytes: 0 %
Lymphocytes Absolute: 1.1 10*3/uL (ref 0.7–3.1)
Lymphs: 21 %
MCH: 28.4 pg (ref 26.6–33.0)
MCHC: 31.7 g/dL (ref 31.5–35.7)
MCV: 89 fL (ref 79–97)
Monocytes Absolute: 0.5 10*3/uL (ref 0.1–0.9)
Monocytes: 9 %
Neutrophils Absolute: 3.5 10*3/uL (ref 1.4–7.0)
Neutrophils: 67 %
Platelets: 261 10*3/uL (ref 150–450)
RBC: 4.9 x10E6/uL (ref 3.77–5.28)
RDW: 12.1 % (ref 11.7–15.4)
WBC: 5.2 10*3/uL (ref 3.4–10.8)

## 2023-03-14 LAB — LIPID PANEL
Chol/HDL Ratio: 3.3 ratio (ref 0.0–4.4)
Cholesterol, Total: 159 mg/dL (ref 100–199)
HDL: 48 mg/dL (ref >39)
LDL Chol Calc (NIH): 86 mg/dL (ref 0–99)
Triglycerides: 142 mg/dL (ref 0–149)
VLDL Cholesterol Cal: 25 mg/dL (ref 5–40)

## 2023-03-14 LAB — VITAMIN D 25 HYDROXY (VIT D DEFICIENCY, FRACTURES): Vit D, 25-Hydroxy: 44.9 ng/mL (ref 30.0–100.0)

## 2023-03-17 LAB — DRUG SCREEN 10 W/CONF, SERUM
Amphetamines, IA: NEGATIVE ng/mL
Barbiturates, IA: NEGATIVE ug/mL
Benzodiazepines, IA: NEGATIVE ng/mL
Cocaine & Metabolite, IA: NEGATIVE ng/mL
Methadone, IA: NEGATIVE ng/mL
Opiates, IA: NEGATIVE ng/mL
Oxycodones, IA: NEGATIVE ng/mL
Phencyclidine, IA: NEGATIVE ng/mL
Propoxyphene, IA: NEGATIVE ng/mL
THC(Marijuana) Metabolite, IA: NEGATIVE ng/mL

## 2023-03-19 ENCOUNTER — Telehealth: Payer: Self-pay | Admitting: Family Medicine

## 2023-03-19 NOTE — Telephone Encounter (Signed)
Pt aware Dettinger out of office today and hasn't reviewed labs yet but as soon as he does we will call pt and she voiced understanding.

## 2023-04-25 ENCOUNTER — Other Ambulatory Visit: Payer: Self-pay | Admitting: Cardiology

## 2023-04-25 DIAGNOSIS — I4819 Other persistent atrial fibrillation: Secondary | ICD-10-CM

## 2023-04-25 NOTE — Telephone Encounter (Signed)
 Prescription refill request for Eliquis received. Indication: AF Last office visit: 02/13/23  Daiva Nakayama MD Scr: 0.86 on 03/13/23  Epic Age: 89 Weight: 73.9kg  Based on above findings Eliquis mg twice daily is the appropriate dose.  Refill approved.

## 2023-04-28 NOTE — Progress Notes (Signed)
  Cardiology Office Note:   Date:  05/01/2023  ID:  Rhonda [name], DOB 09-22-1932, MRN [id number] PCP: Dettinger, Fonda LABOR, MD  [facility] Providers Cardiologist:  Lynwood Schilling, MD {  History of Present Illness:   Rhonda [name] is a 89 y.o. female with atrial fib.  She was in the hospital in Jan 2020 with this.  She was treated with flecainide , Eliquis  and Diltiazem .   In Feb she had a POET ([employer] Exercise Treadmill). In late June 2020 she was in the ED with SVT. She was treated with adenosine .   She has had lower extremity swelling.  She had no evidence of a DVT.  She was dizzy when I last saw her so I reduced her Cardizem .  Since I saw her she is actually done well.  She said that her dizziness is improved.  Lower extremity swelling is improved.  The patient denies any new symptoms such as chest discomfort, neck or arm discomfort. There has been no new shortness of breath, PND or orthopnea. There have been no reported palpitations, presyncope or syncope.  She goes up and down stairs.  She does her household chores.  She watches [employer].    ROS: As stated in the HPI and negative for all other systems.  Studies Reviewed:    EKG:   Sinus bradycardia, rate 57, sinus arrhythmia, no acute ST-T wave changes, 04/15/2023  Risk Assessment/Calculations:    CHA2DS2-VASc Score = 4   This indicates a 4.8% annual risk of stroke. The patient's score is based upon: CHF History: 0 HTN History: 1 Diabetes History: 0 Stroke History: 0 Vascular Disease History: 0 Age Score: 2 Gender Score: 1       Physical Exam:   VS:  BP (!) 142/80   Pulse 72   Ht 5' 5 (1.651 m)   Wt 161 lb (73 kg)   BMI 26.79 kg/m    Wt Readings from Last 3 Encounters:  05/01/23 161 lb (73 kg)  03/13/23 162 lb (73.5 kg)  03/07/23 163 lb (73.9 kg)     GEN: Well nourished, well developed in no acute distress NECK: No JVD; No carotid bruits CARDIAC: RRR, no murmurs, rubs, gallops RESPIRATORY:   Clear to auscultation without rales, wheezing or rhonchi  ABDOMEN: Soft, non-tender, non-distended EXTREMITIES:  No edema; No deformity   ASSESSMENT AND PLAN:   Persistent atrial fibrillation:    The patient tolerates anticoagulation.  She has had no recurrent dysrhythmias.  No change in therapy.   Dizziness:   This is improved with reduced dose of Cardizem .  No change in therapy.  Hypertension: The blood pressure is at target.  No change in therapy.    Swelling:    This is mild.  She will continue with compression stockings.  No change in therapy.     Follow up with me in 1 year.  Signed, Lynwood Schilling, MD

## 2023-05-01 ENCOUNTER — Ambulatory Visit: Payer: PPO | Admitting: Cardiology

## 2023-05-01 ENCOUNTER — Encounter: Payer: Self-pay | Admitting: Cardiology

## 2023-05-01 VITALS — BP 142/80 | HR 72 | Ht 65.0 in | Wt 161.0 lb

## 2023-05-01 DIAGNOSIS — I10 Essential (primary) hypertension: Secondary | ICD-10-CM | POA: Diagnosis not present

## 2023-05-01 DIAGNOSIS — I4819 Other persistent atrial fibrillation: Secondary | ICD-10-CM

## 2023-05-01 DIAGNOSIS — M7989 Other specified soft tissue disorders: Secondary | ICD-10-CM

## 2023-05-01 NOTE — Patient Instructions (Signed)

## 2023-08-15 ENCOUNTER — Ambulatory Visit (INDEPENDENT_AMBULATORY_CARE_PROVIDER_SITE_OTHER): Admitting: Family Medicine

## 2023-08-15 ENCOUNTER — Ambulatory Visit

## 2023-08-15 ENCOUNTER — Encounter: Payer: Self-pay | Admitting: Family Medicine

## 2023-08-15 VITALS — BP 148/68 | HR 67 | Ht 65.0 in | Wt 161.0 lb

## 2023-08-15 DIAGNOSIS — M19012 Primary osteoarthritis, left shoulder: Secondary | ICD-10-CM

## 2023-08-15 MED ORDER — METHYLPREDNISOLONE ACETATE 80 MG/ML IJ SUSP
80.0000 mg | Freq: Once | INTRAMUSCULAR | Status: AC
  Administered 2023-08-15: 80 mg via INTRA_ARTICULAR

## 2023-08-15 NOTE — Progress Notes (Signed)
 BP (!) 148/68   Pulse 67   Ht 5\' 5"  (1.651 m)   Wt 161 lb (73 kg)   SpO2 96%   BMI 26.79 kg/m    Subjective:   Patient ID: Rhonda [name], female    DOB: [birth year], 89 y.o.   MRN: [id number]  HPI: Rhonda [name] is a 89 y.o. female presenting on 08/15/2023 for Shoulder Pain (left)   HPI Left shoulder pain and arthritis. Patient comes in today because her left shoulder pain and arthritis is bothering her again.  She says started bothering her again about 2 or 3 weeks ago.  Her last injection was about 5 months ago.  She said it did really well until just recently.  She says it has waking her up at night sometimes with the pain and hurts to move it overhead.  She denies any fevers or chills or new trauma or any major changes except that the arthritis is just flaring back up.  She has been using some Tylenol  and it does help a little bit.  Relevant past medical, surgical, family and social history reviewed and updated as indicated. Interim medical history since our last visit reviewed. Allergies and medications reviewed and updated.  Review of Systems  Musculoskeletal:  Positive for arthralgias. Negative for gait problem and neck pain.  All other systems reviewed and are negative.   Per HPI unless specifically indicated above   Allergies as of 08/15/2023   No Known Allergies      Medication List        Accurate as of August 15, 2023 10:55 AM. If you have any questions, ask your nurse or doctor.          acetaminophen  650 MG CR tablet Commonly known as: TYLENOL  Take 1 tablet (650 mg total) by mouth every 8 (eight) hours as needed for pain. What changed: how much to take   cholecalciferol  25 MCG (1000 UNIT) tablet Commonly known as: VITAMIN D3 Take 2,000 Units by mouth 2 (two) times daily.   diclofenac  Sodium 1 % Gel Commonly known as: Voltaren  Apply 2 g topically 4 (four) times daily.   diltiazem  300 MG 24 hr capsule Commonly known as: TIAZAC  Take 1  capsule (300 mg total) by mouth daily.   Eliquis  5 MG Tabs tablet Generic drug: apixaban  TAKE ONE TABLET BY MOUTH TWICE DAILY   flecainide  50 MG tablet Commonly known as: TAMBOCOR  TAKE ONE (1) TABLET BY MOUTH TWO (2) TIMES DAILY   PRESERVISION AREDS 2 PO Take by mouth.   zolpidem  5 MG tablet Commonly known as: AMBIEN  Take 1 tablet (5 mg total) by mouth at bedtime as needed for sleep. Put on file until patient asked for refill         Objective:   BP (!) 148/68   Pulse 67   Ht 5\' 5"  (1.651 m)   Wt 161 lb (73 kg)   SpO2 96%   BMI 26.79 kg/m   Wt Readings from Last 3 Encounters:  08/15/23 161 lb (73 kg)  05/01/23 161 lb (73 kg)  03/13/23 162 lb (73.5 kg)    Physical Exam Vitals and nursing note reviewed.  Constitutional:      General: She is not in acute distress.    Appearance: She is well-developed. She is not diaphoretic.  Eyes:     Conjunctiva/sclera: Conjunctivae normal.  Musculoskeletal:     Left shoulder: Tenderness and crepitus present. Decreased range of motion (Some decreased overhead range of  motion.). Normal strength. Normal pulse.  Skin:    General: Skin is warm and dry.     Findings: No rash.  Neurological:     Mental Status: She is alert and oriented to person, place, and time.     Coordination: Coordination normal.  Psychiatric:        Behavior: Behavior normal.     Left subacromial injection: Consent form signed. Risk factors of bleeding and infection discussed with patient and patient is agreeable towards injection. Patient prepped with Betadine .  Posterior approach towards injection used. Injected 80 mg of Depo-Medrol  and 1 mL of 2% lidocaine . Patient tolerated procedure well and no side effects from noted. Minimal to no bleeding. Simple bandage applied after.   Assessment & Plan:   Problem List Items Addressed This Visit   None Visit Diagnoses       Arthritis of left shoulder region    -  Primary   Relevant Medications    methylPREDNISolone  acetate (DEPO-MEDROL ) injection 80 mg (Start on 08/15/2023 11:00 AM)        Follow up plan: Return if symptoms worsen or fail to improve.  Counseling provided for all of the vaccine components No orders of the defined types were placed in this encounter.   Jolyne Needs, MD [facility] 08/15/2023, 10:55 AM

## 2023-08-20 ENCOUNTER — Other Ambulatory Visit: Payer: Self-pay | Admitting: Family Medicine

## 2023-08-20 DIAGNOSIS — F5101 Primary insomnia: Secondary | ICD-10-CM

## 2023-09-11 ENCOUNTER — Ambulatory Visit (INDEPENDENT_AMBULATORY_CARE_PROVIDER_SITE_OTHER): Payer: PPO | Admitting: Family Medicine

## 2023-09-11 ENCOUNTER — Encounter: Payer: Self-pay | Admitting: Family Medicine

## 2023-09-11 VITALS — BP 138/76 | HR 69 | Ht 65.0 in | Wt 162.0 lb

## 2023-09-11 DIAGNOSIS — I4811 Longstanding persistent atrial fibrillation: Secondary | ICD-10-CM

## 2023-09-11 DIAGNOSIS — I10 Essential (primary) hypertension: Secondary | ICD-10-CM

## 2023-09-11 DIAGNOSIS — F5101 Primary insomnia: Secondary | ICD-10-CM | POA: Diagnosis not present

## 2023-09-11 MED ORDER — ZOLPIDEM TARTRATE 5 MG PO TABS: 5.0000 mg | ORAL_TABLET | Freq: Every evening | ORAL | 5 refills | Status: DC | PRN

## 2023-09-11 NOTE — Progress Notes (Signed)
 BP 138/76 (BP Location: Right Arm, Patient Position: Standing)   Pulse 69   Ht 5\' 5"  (1.651 m)   Wt 162 lb (73.5 kg)   SpO2 97%   BMI 26.96 kg/m    Subjective:   Patient ID: Rhonda [name], female    DOB: 1932/05/04, 89 y.o.   MRN: [id number]  HPI: Rhonda [name] is a 89 y.o. female presenting on 09/11/2023 for Medical Management of Chronic Issues, Hypertension, and Insomnia   HPI Hypertension Patient is currently on diltiazem  and flecainide , and their blood pressure today is 138/76.  Patient has had 2 episodes in the past month where she all of a sudden felt very lightheaded and dizzy she tried to stand up and her blood pressure dropped to the 90s over 30s.  She said her heart rate dropped down into the 40s as well.  She said she was dizzy with standing but if she sat down it went away.. Patient denies headaches, blurred vision, chest pains, shortness of breath, or weakness. Denies any side effects from medication and is content with current medication.   Insomnia Current rx- ambien  10 mg at bedtime prn # meds rx-30/month Effectiveness of current meds-works well Adverse reactions form meds-none Pill count performed-No Last drug screen -03/13/2023 ( high risk q39m, moderate risk q76m, low risk yearly ) Urine drug screen today- No Was the NCC[employer] reviewed-yes  If yes were their any concerning findings? -None Controlled substance contract signed on: 03/13/2023  Relevant past medical, surgical, family and social history reviewed and updated as indicated. Interim medical history since our last visit reviewed. Allergies and medications reviewed and updated.  Review of Systems  Constitutional:  Negative for chills and fever.  HENT:  Negative for congestion, ear discharge and ear pain.   Eyes:  Negative for redness and visual disturbance.  Respiratory:  Negative for chest tightness and shortness of breath.   Cardiovascular:  Positive for leg swelling. Negative for chest pain.   Genitourinary:  Negative for difficulty urinating and dysuria.  Musculoskeletal:  Negative for back pain and gait problem.  Skin:  Negative for rash.  Neurological:  Negative for light-headedness and headaches.  Psychiatric/Behavioral:  Negative for agitation and behavioral problems.   All other systems reviewed and are negative.   Per HPI unless specifically indicated above   Allergies as of 09/11/2023   No Known Allergies      Medication List        Accurate as of Sep 11, 2023  9:54 AM. If you have any questions, ask your nurse or doctor.          acetaminophen  650 MG CR tablet Commonly known as: TYLENOL  Take 1 tablet (650 mg total) by mouth every 8 (eight) hours as needed for pain. What changed: how much to take   cholecalciferol  25 MCG (1000 UNIT) tablet Commonly known as: VITAMIN D3 Take 2,000 Units by mouth 2 (two) times daily.   diclofenac  Sodium 1 % Gel Commonly known as: Voltaren  Apply 2 g topically 4 (four) times daily.   diltiazem  300 MG 24 hr capsule Commonly known as: TIAZAC  Take 1 capsule (300 mg total) by mouth daily.   Eliquis  5 MG Tabs tablet Generic drug: apixaban  TAKE ONE TABLET BY MOUTH TWICE DAILY   flecainide  50 MG tablet Commonly known as: TAMBOCOR  TAKE ONE (1) TABLET BY MOUTH TWO (2) TIMES DAILY   PRESERVISION AREDS 2 PO Take by mouth.   zolpidem  5 MG tablet Commonly known as: AMBIEN   Take 1 tablet (5 mg total) by mouth at bedtime as needed for sleep. Put on file until patient asked for refill         Objective:   BP 138/76 (BP Location: Right Arm, Patient Position: Standing)   Pulse 69   Ht 5\' 5"  (1.651 m)   Wt 162 lb (73.5 kg)   SpO2 97%   BMI 26.96 kg/m   Wt Readings from Last 3 Encounters:  09/11/23 162 lb (73.5 kg)  08/15/23 161 lb (73 kg)  05/01/23 161 lb (73 kg)    Physical Exam Vitals and nursing note reviewed.  Constitutional:      General: She is not in acute distress.    Appearance: She is  well-developed. She is not diaphoretic.  Eyes:     Conjunctiva/sclera: Conjunctivae normal.  Cardiovascular:     Rate and Rhythm: Normal rate. Rhythm irregular.     Heart sounds: Normal heart sounds. No murmur heard. Pulmonary:     Effort: Pulmonary effort is normal. No respiratory distress.     Breath sounds: Normal breath sounds. No wheezing.  Musculoskeletal:        General: Swelling (1+) present.  Skin:    General: Skin is warm and dry.     Findings: No rash.  Neurological:     Mental Status: She is alert and oriented to person, place, and time.     Coordination: Coordination normal.  Psychiatric:        Behavior: Behavior normal.       Assessment & Plan:   Problem List Items Addressed This Visit       Cardiovascular and Mediastinum   Atrial fibrillation (HCC)   Relevant Orders   CBC with Differential/Platelet   CMP14+EGFR   Lipid panel   Essential hypertension - Primary   Relevant Orders   CBC with Differential/Platelet   CMP14+EGFR   Lipid panel     Other   Insomnia   Relevant Medications   zolpidem  (AMBIEN ) 5 MG tablet    Continue current medicine, instructed her to speak with her cardiologist about those 2 episodes.  Her blood pressure looks good today and orthostatics look good today and heart rate looks good today.  She is going to call of her cardiology office today. Follow up plan: Return in about 6 months (around 03/13/2024), or if symptoms worsen or fail to improve, for Physical exam and hypertension and A-fib..  Counseling provided for all of the vaccine components Orders Placed This Encounter  Procedures   CBC with Differential/Platelet   CMP14+EGFR   Lipid panel    Jolyne Needs, MD [facility] 09/11/2023, 9:54 AM

## 2023-09-12 LAB — CMP14+EGFR
ALT: 10 IU/L (ref 0–32)
AST: 12 IU/L (ref 0–40)
Albumin: 3.9 g/dL (ref 3.7–4.7)
Alkaline Phosphatase: 104 IU/L (ref 44–121)
BUN/Creatinine Ratio: 19 (ref 12–28)
BUN: 16 mg/dL (ref 8–27)
Bilirubin Total: 0.5 mg/dL (ref 0.0–1.2)
CO2: 22 mmol/L (ref 20–29)
Calcium: 9.2 mg/dL (ref 8.7–10.3)
Chloride: 101 mmol/L (ref 96–106)
Creatinine, Ser: 0.86 mg/dL (ref 0.57–1.00)
Globulin, Total: 2.4 g/dL (ref 1.5–4.5)
Glucose: 82 mg/dL (ref 70–99)
Potassium: 4.2 mmol/L (ref 3.5–5.2)
Sodium: 138 mmol/L (ref 134–144)
Total Protein: 6.3 g/dL (ref 6.0–8.5)
eGFR: 65 mL/min/{1.73_m2} (ref >59)

## 2023-09-12 LAB — CBC WITH DIFFERENTIAL/PLATELET
Basophils Absolute: 0.1 10*3/uL (ref 0.0–0.2)
Basos: 1 %
EOS (ABSOLUTE): 0.1 10*3/uL (ref 0.0–0.4)
Eos: 1 %
Hematocrit: 41.7 % (ref 34.0–46.6)
Hemoglobin: 14.1 g/dL (ref 11.1–15.9)
Immature Grans (Abs): 0 10*3/uL (ref 0.0–0.1)
Immature Granulocytes: 0 %
Lymphocytes Absolute: 1 10*3/uL (ref 0.7–3.1)
Lymphs: 18 %
MCH: 29.9 pg (ref 26.6–33.0)
MCHC: 33.8 g/dL (ref 31.5–35.7)
MCV: 89 fL (ref 79–97)
Monocytes Absolute: 0.4 10*3/uL (ref 0.1–0.9)
Monocytes: 8 %
Neutrophils Absolute: 4.1 10*3/uL (ref 1.4–7.0)
Neutrophils: 72 %
Platelets: 210 10*3/uL (ref 150–450)
RBC: 4.71 x10E6/uL (ref 3.77–5.28)
RDW: 12.5 % (ref 11.7–15.4)
WBC: 5.7 10*3/uL (ref 3.4–10.8)

## 2023-09-12 LAB — LIPID PANEL
Chol/HDL Ratio: 3.3 ratio (ref 0.0–4.4)
Cholesterol, Total: 165 mg/dL (ref 100–199)
HDL: 50 mg/dL (ref >39)
LDL Chol Calc (NIH): 94 mg/dL (ref 0–99)
Triglycerides: 116 mg/dL (ref 0–149)
VLDL Cholesterol Cal: 21 mg/dL (ref 5–40)

## 2023-09-17 ENCOUNTER — Telehealth: Payer: Self-pay | Admitting: Cardiology

## 2023-09-17 NOTE — Telephone Encounter (Signed)
 Spoke with patient and she states she have been having episodes where her BP drops really low and she gets dizzy. She states it happens if she is just sitting down and relaxing or getting up out the chair. Then later in the evening its elevated. She was not able to give exact numbers but states it was in the 90's. Then it will be in the 150's. She had OV with PCP and would like for you to review his notes and provide recommendations on her BP.  Informed patient to e sure she is staying hydrated, when her BP is trying eating a snack and elevating her legs. When changing positions to change them slowly. ED precautions discussed.

## 2023-09-17 NOTE — Telephone Encounter (Signed)
 Pt called in asking for Dr. Lavonne Prairie to review her OV notes with PCP (09/11/23, notes in Epic). He would like to know what he advises about her BP. Please advise.

## 2023-09-19 MED ORDER — DILTIAZEM HCL ER COATED BEADS 240 MG PO CP24: 240.0000 mg | ORAL_CAPSULE | Freq: Every day | ORAL | 3 refills | Status: DC

## 2023-09-19 NOTE — Telephone Encounter (Signed)
 Rhonda Grates, MD  [facility] [phone] minutes ago (7:41 AM)    Maybe we can reduce her Cardizem  to 240 mg daily.  She will need a new prescription.  It does not look like her PCP made any other changes.   Pt advised and I will send in her new RX.Rhonda [name] she will monitor her symptoms and her BP and will let us  know how she is doing

## 2023-09-20 ENCOUNTER — Ambulatory Visit: Payer: Self-pay | Admitting: Family Medicine

## 2023-10-09 ENCOUNTER — Telehealth: Payer: Self-pay | Admitting: Family Medicine

## 2023-10-09 NOTE — Telephone Encounter (Signed)
 No answer and vmail is full on cell. Called home number. No answer or ring. Said call could not be completed at this time.

## 2023-10-09 NOTE — Telephone Encounter (Signed)
 Please ask her if the shoulder injection helped at all initially and then wore off or if it did not help at all or if it made it worse.  She may need an orthopedic referral.

## 2023-10-09 NOTE — Telephone Encounter (Signed)
 Unfortunately with such a short relief from it I would say that the only next best bet is to go to the orthopedic and discuss options with them.  If she would like an orthopedic referral and please place 1 for her.  Diagnosis osteoarthritis shoulder

## 2023-10-09 NOTE — Telephone Encounter (Signed)
 Patient states that it helped initially and then wore off.

## 2023-10-10 NOTE — Telephone Encounter (Signed)
 Unfortunately she cannot take oral diclofenac  because she is on a blood thinner and they do not recommend taking these orally on blood thinners.  She can take Tylenol  or Aleve along with the Biofreeze and the diclofenac  topical

## 2023-10-10 NOTE — Telephone Encounter (Signed)
 Pt made aware by Mitzi and will call back when ready for referral to ortho

## 2023-10-10 NOTE — Telephone Encounter (Signed)
 Pt does not want referral at this time. Would like to continue using Biofreeze and diclofenac . Suggest maybe an oral med to take for pain (Diclofenac ). Is this something you would recommend.

## 2023-10-18 ENCOUNTER — Telehealth: Payer: Self-pay | Admitting: Family Medicine

## 2023-10-18 DIAGNOSIS — M19012 Primary osteoarthritis, left shoulder: Secondary | ICD-10-CM

## 2023-10-18 NOTE — Telephone Encounter (Signed)
 REFERRAL REQUEST Telephone Note  Have you been seen at our office for this problem? YES (Advise that they may need an appointment with their PCP before a referral can be done)  Reason for Referral: Left shoulder Referral discussed with patient: yes  Best contact number of patient for referral team: [phone]    Has patient been seen by a specialist for this issue before:YES but doctor has retired  Patient provider preference for referral: [facility] Patient location preference for referral: [facility]   Patient notified that referrals can take up to a week or longer to process. If they haven't heard anything within a week they should call back and speak with the referral department.

## 2023-10-18 NOTE — Telephone Encounter (Signed)
 Left message making pt aware that the referral has been placed and that they will call to get an appt scheduled in the next 14 business days.

## 2023-10-29 ENCOUNTER — Other Ambulatory Visit: Payer: Self-pay | Admitting: Family Medicine

## 2023-10-29 DIAGNOSIS — I4811 Longstanding persistent atrial fibrillation: Secondary | ICD-10-CM

## 2023-11-01 ENCOUNTER — Telehealth: Payer: Self-pay | Admitting: Family Medicine

## 2023-11-01 NOTE — Telephone Encounter (Signed)
 Referral sent to: [facility] [address], [address] - [place] [address] [phone]  MyChart Message sent to Patient with Specialty Office contact information.

## 2023-11-01 NOTE — Telephone Encounter (Signed)
 Calling to check on referral to orthopedic. Referral put in 10-18-23.

## 2023-11-01 NOTE — Telephone Encounter (Signed)
 Pt made aware and will give them a call to schedule

## 2023-11-26 DIAGNOSIS — M19012 Primary osteoarthritis, left shoulder: Secondary | ICD-10-CM | POA: Diagnosis not present

## 2023-12-02 ENCOUNTER — Other Ambulatory Visit: Payer: Self-pay

## 2023-12-02 ENCOUNTER — Encounter (HOSPITAL_COMMUNITY): Payer: Self-pay | Admitting: Emergency Medicine

## 2023-12-02 ENCOUNTER — Emergency Department (HOSPITAL_COMMUNITY)

## 2023-12-02 ENCOUNTER — Emergency Department (HOSPITAL_COMMUNITY)
Admission: EM | Admit: 2023-12-02 | Discharge: 2023-12-02 | Disposition: A | Discharge status: Home / Self Care | Attending: Emergency Medicine | Admitting: Emergency Medicine

## 2023-12-02 DIAGNOSIS — R531 Weakness: Secondary | ICD-10-CM | POA: Diagnosis not present

## 2023-12-02 DIAGNOSIS — I4891 Unspecified atrial fibrillation: Secondary | ICD-10-CM | POA: Diagnosis not present

## 2023-12-02 DIAGNOSIS — R000 Tachycardia, unspecified: Secondary | ICD-10-CM | POA: Diagnosis not present

## 2023-12-02 DIAGNOSIS — R0602 Shortness of breath: Secondary | ICD-10-CM | POA: Diagnosis not present

## 2023-12-02 DIAGNOSIS — I499 Cardiac arrhythmia, unspecified: Secondary | ICD-10-CM | POA: Diagnosis not present

## 2023-12-02 DIAGNOSIS — R002 Palpitations: Secondary | ICD-10-CM | POA: Diagnosis present

## 2023-12-02 DIAGNOSIS — R918 Other nonspecific abnormal finding of lung field: Secondary | ICD-10-CM | POA: Diagnosis not present

## 2023-12-02 DIAGNOSIS — Z7901 Long term (current) use of anticoagulants: Secondary | ICD-10-CM | POA: Insufficient documentation

## 2023-12-02 LAB — COMPREHENSIVE METABOLIC PANEL WITH GFR
ALT: 22 U/L (ref 0–44)
AST: 17 U/L (ref 15–41)
Albumin: 3.3 g/dL — ABNORMAL LOW (ref 3.5–5.0)
Alkaline Phosphatase: 67 U/L (ref 38–126)
Anion gap: 9 (ref 5–15)
BUN: 24 mg/dL — ABNORMAL HIGH (ref 8–23)
CO2: 23 mmol/L (ref 22–32)
Calcium: 8.1 mg/dL — ABNORMAL LOW (ref 8.9–10.3)
Chloride: 106 mmol/L (ref 98–111)
Creatinine, Ser: 0.99 mg/dL (ref 0.44–1.00)
GFR, Estimated: 55 mL/min — ABNORMAL LOW (ref >60)
Glucose, Bld: 101 mg/dL — ABNORMAL HIGH (ref 70–99)
Potassium: 3.7 mmol/L (ref 3.5–5.1)
Sodium: 138 mmol/L (ref 135–145)
Total Bilirubin: 0.6 mg/dL (ref 0.0–1.2)
Total Protein: 6 g/dL — ABNORMAL LOW (ref 6.5–8.1)

## 2023-12-02 LAB — CBC WITH DIFFERENTIAL/PLATELET
Abs Immature Granulocytes: 0.05 K/uL (ref 0.00–0.07)
Basophils Absolute: 0 K/uL (ref 0.0–0.1)
Basophils Relative: 0 %
Eosinophils Absolute: 0.1 K/uL (ref 0.0–0.5)
Eosinophils Relative: 1 %
HCT: 41.7 % (ref 36.0–46.0)
Hemoglobin: 13 g/dL (ref 12.0–15.0)
Immature Granulocytes: 1 %
Lymphocytes Relative: 17 %
Lymphs Abs: 1.7 K/uL (ref 0.7–4.0)
MCH: 29.7 pg (ref 26.0–34.0)
MCHC: 31.2 g/dL (ref 30.0–36.0)
MCV: 95.4 fL (ref 80.0–100.0)
Monocytes Absolute: 0.9 K/uL (ref 0.1–1.0)
Monocytes Relative: 9 %
Neutro Abs: 7.3 K/uL (ref 1.7–7.7)
Neutrophils Relative %: 72 %
Platelets: 301 K/uL (ref 150–400)
RBC: 4.37 MIL/uL (ref 3.87–5.11)
RDW: 13.2 % (ref 11.5–15.5)
WBC: 10 K/uL (ref 4.0–10.5)
nRBC: 0 % (ref 0.0–0.2)

## 2023-12-02 LAB — TROPONIN I (HIGH SENSITIVITY)
Troponin I (High Sensitivity): 10 ng/L (ref <18)
Troponin I (High Sensitivity): 10 ng/L (ref <18)

## 2023-12-02 MED ORDER — SODIUM CHLORIDE 0.9 % IV BOLUS
1000.0000 mL | Freq: Once | INTRAVENOUS | Status: AC
  Administered 2023-12-02 (×2): 1000 mL via INTRAVENOUS

## 2023-12-02 MED ORDER — DILTIAZEM HCL 25 MG/5ML IV SOLN
10.0000 mg | Freq: Once | INTRAVENOUS | Status: AC
  Administered 2023-12-02 (×2): 10 mg via INTRAVENOUS
  Filled 2023-12-02: qty 5

## 2023-12-02 MED ORDER — PROPOFOL 10 MG/ML IV BOLUS
INTRAVENOUS | Status: AC | PRN
  Administered 2023-12-02 (×2): 40 mg via INTRAVENOUS

## 2023-12-02 MED ORDER — SODIUM CHLORIDE 0.9 % IV BOLUS
500.0000 mL | Freq: Once | INTRAVENOUS | Status: AC
  Administered 2023-12-02 (×2): 500 mL via INTRAVENOUS

## 2023-12-02 MED ORDER — PROPOFOL 10 MG/ML IV BOLUS
0.5000 mg/kg | Freq: Once | INTRAVENOUS | Status: DC
  Filled 2023-12-02: qty 20

## 2023-12-02 MED ORDER — DILTIAZEM HCL-DEXTROSE 125-5 MG/125ML-% IV SOLN (PREMIX)
5.0000 mg/h | INTRAVENOUS | Status: DC
  Administered 2023-12-02 (×2): 5 mg/h via INTRAVENOUS
  Filled 2023-12-02: qty 125

## 2023-12-02 NOTE — ED Triage Notes (Signed)
 Pt to the ED [facility] from home with complaints of more weakness than normal and increased HR. EMS reports and has an EKG strip resulting in Afib with RVR.  Pt was given 18mg  of Cardizem  and 1000 ml of NS in route with EMS.

## 2023-12-02 NOTE — Discharge Instructions (Signed)
 Follow-up with your cardiologist in the next couple weeks.  Return sooner if any problems

## 2023-12-02 NOTE — ED Provider Notes (Signed)
 [place] EMERGENCY DEPARTMENT AT [facility] Provider Note   CSN: [id number] Arrival date & time: 12/02/23  0932     Patient presents with: Weakness   Rhonda [name] is a 89 y.o. female.   Patient has a history of atrial fibs.  She is usually in sinus rhythm and has been cardioverted a couple times.  Patient states that yesterday she noticed her heart was beating fast.  The history is provided by the patient and medical records. No language interpreter was used.  Palpitations Palpitations quality:  Irregular Onset quality:  Sudden Timing:  Constant Progression:  Worsening Chronicity:  Recurrent Context: not anxiety   Relieved by:  Nothing Worsened by:  Nothing Ineffective treatments:  None tried Associated symptoms: no back pain, no chest pain and no cough   CRITICAL CARE Performed by: Fairy Sermon Total critical care time: 45 minutes Critical care time was exclusive of separately billable procedures and treating other patients. Critical care was necessary to treat or prevent imminent or life-threatening deterioration. Critical care was time spent personally by me on the following activities: development of treatment plan with patient and/or surrogate as well as nursing, discussions with consultants, evaluation of patient's response to treatment, examination of patient, obtaining history from patient or surrogate, ordering and performing treatments and interventions, ordering and review of laboratory studies, ordering and review of radiographic studies, pulse oximetry and re-evaluation of patient's condition.   Patient was cardioverted twice at 200 J in it worked a second time.  Prior to Admission medications   Medication Sig Start Date End Date Taking? Authorizing Provider  acetaminophen  (TYLENOL ) 650 MG CR tablet Take 1 tablet (650 mg total) by mouth every 8 (eight) hours as needed for pain. 12/26/21  Yes Cherylene Homer HERO, NP  cholecalciferol  (VITAMIN D3) 25  MCG (1000 UT) tablet Take 2,000 Units by mouth 2 (two) times daily.   Yes [provider]  diclofenac  Sodium (VOLTAREN ) 1 % GEL Apply 2 g topically 4 (four) times daily. 03/13/23  Yes Dettinger, Fonda LABOR, MD  diltiazem  (CARDIZEM  CD) 240 MG 24 hr capsule Take 1 capsule (240 mg total) by mouth daily. 09/19/23  Yes Lavona Agent, MD  ELIQUIS  5 MG TABS tablet TAKE ONE TABLET BY MOUTH TWICE DAILY 04/25/23  Yes Lavona Agent, MD  flecainide  (TAMBOCOR ) 50 MG tablet TAKE ONE (1) TABLET BY MOUTH TWO (2) TIMES DAILY Patient taking differently: Take 50 mg by mouth 2 (two) times daily. 10/29/23  Yes Dettinger, Fonda LABOR, MD  zolpidem  (AMBIEN ) 5 MG tablet Take 1 tablet (5 mg total) by mouth at bedtime as needed for sleep. Put on file until patient asked for refill 09/11/23  Yes Dettinger, Fonda LABOR, MD    Allergies: Patient has no known allergies.    Review of Systems  Constitutional:  Negative for appetite change and fatigue.  HENT:  Negative for congestion, ear discharge and sinus pressure.   Eyes:  Negative for discharge.  Respiratory:  Negative for cough.   Cardiovascular:  Positive for palpitations. Negative for chest pain.  Gastrointestinal:  Negative for abdominal pain and diarrhea.  Genitourinary:  Negative for frequency and hematuria.  Musculoskeletal:  Negative for back pain.  Skin:  Negative for rash.  Neurological:  Negative for seizures and headaches.  Psychiatric/Behavioral:  Negative for hallucinations.     Updated Vital Signs BP 110/67   Pulse 72   Temp 98.1 F (36.7 C) (Oral)   Resp (!) 28   Ht 5' 5 (1.651  m)   Wt 72.6 kg   SpO2 100%   BMI 26.63 kg/m   Physical Exam Vitals and nursing note reviewed.  Constitutional:      Appearance: She is well-developed.  HENT:     Head: Normocephalic.     Nose: Nose normal.  Eyes:     General: No scleral icterus.    Conjunctiva/sclera: Conjunctivae normal.  Neck:     Thyroid : No thyromegaly.  Cardiovascular:     Rate and  Rhythm: Tachycardia present. Rhythm irregular.     Heart sounds: No murmur heard.    No friction rub. No gallop.  Pulmonary:     Breath sounds: No stridor. No wheezing or rales.  Chest:     Chest wall: No tenderness.  Abdominal:     General: There is no distension.     Tenderness: There is no abdominal tenderness. There is no rebound.  Musculoskeletal:        General: Normal range of motion.     Cervical back: Neck supple.  Lymphadenopathy:     Cervical: No cervical adenopathy.  Skin:    Findings: No erythema or rash.  Neurological:     Mental Status: She is alert and oriented to person, place, and time.     Motor: No abnormal muscle tone.     Coordination: Coordination normal.  Psychiatric:        Behavior: Behavior normal.     (all labs ordered are listed, but only abnormal results are displayed) Labs Reviewed  COMPREHENSIVE METABOLIC PANEL WITH GFR - Abnormal; Notable for the following components:      Result Value   Glucose, Bld 101 (*)    BUN 24 (*)    Calcium  8.1 (*)    Total Protein 6.0 (*)    Albumin 3.3 (*)    GFR, Estimated 55 (*)    All other components within normal limits  CBC WITH DIFFERENTIAL/PLATELET  TROPONIN I (HIGH SENSITIVITY)  TROPONIN I (HIGH SENSITIVITY)    EKG: None  Radiology: [facility] Chest Port 1 View Result Date: 12/02/2023 CLINICAL DATA:  Shortness of breath with increased weakness and tachycardia. EXAM: PORTABLE CHEST 1 VIEW COMPARISON:  Radiographs 10/18/2018 and 05/19/2018. FINDINGS: 0956 hours. Lordotic positioning with mild patient rotation to the right. The heart size and mediastinal contours are stable. Stable elevation of the right hemidiaphragm with associated chronic right basilar atelectasis or scarring. No edema, confluent airspace disease, pneumothorax or significant pleural effusion. The bones appear unchanged. IMPRESSION: No evidence of acute cardiopulmonary process. Chronic elevation of the right hemidiaphragm with associated right  basilar atelectasis or scarring. Electronically Signed   By: Elsie Perone M.D.   On: 12/02/2023 10:43     .Cardioversion  Date/Time: 12/02/2023 4:10 PM  Performed by: Suzette Pac, MD Authorized by: Suzette Pac, MD   Attempt one:    Shock (Joules):  200 Attempt two:    Cardioversion mode:  Synchronous   Shock (Joules):  200 Comments:     Patient was sedated with propofol .  She was cardioverted with 200 J synchronous.  After the first cardioversion patient was still in atrial fibrillation.  So she was cardioverted a second time with additional propofol  sedation and converted to normal sinus rhythm     Medications Ordered in the ED  diltiazem  (CARDIZEM ) 125 mg in dextrose  5% 125 mL (1 mg/mL) infusion (0 mg/hr Intravenous Stopped 12/02/23 1036)  propofol  (DIPRIVAN ) 10 mg/mL bolus/IV push 36.3 mg (has no administration in time range)  diltiazem  (  CARDIZEM ) injection 10 mg (10 mg Intravenous Given 12/02/23 1002)  sodium chloride  0.9 % bolus 500 mL (0 mLs Intravenous Stopped 12/02/23 1100)  sodium chloride  0.9 % bolus 1,000 mL (0 mLs Intravenous Stopped 12/02/23 1400)  propofol  (DIPRIVAN ) 10 mg/mL bolus/IV push (40 mg Intravenous Given 12/02/23 1231)  sodium chloride  0.9 % bolus 1,000 mL (1,000 mLs Intravenous New Bag/Given 12/02/23 1347)   CRITICAL CARE Performed by: Fairy Sermon Total critical care time: 45 minutes Critical care time was exclusive of separately billable procedures and treating other patients. Critical care was necessary to treat or prevent imminent or life-threatening deterioration. Critical care was time spent personally by me on the following activities: development of treatment plan with patient and/or surrogate as well as nursing, discussions with consultants, evaluation of patient's response to treatment, examination of patient, obtaining history from patient or surrogate, ordering and performing treatments and interventions, ordering and review of laboratory  studies, ordering and review of radiographic studies, pulse oximetry and re-evaluation of patient's condition.                                  Medical Decision Making Amount and/or Complexity of Data Reviewed Labs: ordered. Radiology: ordered.  Risk Prescription drug management.   Atrial fibrillation with rapid ventricular rate that has been cardioverted.  Patient is in normal sinus open follow-up with cardiology     Final diagnoses:  Atrial fibrillation with RVR [facility])    ED Discharge Orders     None          Sermon Fairy, MD 12/02/23 1611

## 2023-12-02 NOTE — ED Notes (Signed)
 Cardizem  paused and Dr Suzette made aware BP dropped to 87/46. New orders placed. Rate of Cardizem  was decreased to 2.5 per Dr Zammit and 500 NS bolus was started.

## 2023-12-02 NOTE — ED Notes (Signed)
 RT present for cardioversion.

## 2023-12-02 NOTE — Sedation Documentation (Signed)
 200J synced shock delivered

## 2023-12-11 ENCOUNTER — Telehealth: Payer: Self-pay | Admitting: Cardiology

## 2023-12-11 NOTE — Telephone Encounter (Signed)
 Spoke to patient she stated she went to [facility] ED 8/11 with afib.Stated she had a cardioversion.Stated she feels like heart is out of rhythm again.She is sob.She has swelling in both ankles.Appointment scheduled with Afib clinic tomorrow 8/21 at 9:30 am.I will make Dr.Hochrein aware.

## 2023-12-11 NOTE — Telephone Encounter (Signed)
 Pt c/o swelling: STAT is pt has developed SOB within 24 hours  How much weight have you gained and in what time span? Unsure   If swelling, where is the swelling located? Ankles   Are you currently taking a fluid pill? No  Are you currently SOB? Not at the moment   Do you have a log of your daily weights (if so, list)? No  Have you gained 3 pounds in a day or 5 pounds in a week? Unsure  Have you traveled recently? No  Pt stated she just doesn't have any energy and was recently in ED

## 2023-12-12 ENCOUNTER — Other Ambulatory Visit (HOSPITAL_COMMUNITY): Payer: Self-pay

## 2023-12-12 ENCOUNTER — Ambulatory Visit (HOSPITAL_COMMUNITY)
Admission: RE | Admit: 2023-12-12 | Discharge: 2023-12-12 | Disposition: A | Discharge status: Home / Self Care | Source: Ambulatory Visit | Attending: Internal Medicine | Admitting: Internal Medicine

## 2023-12-12 ENCOUNTER — Encounter (HOSPITAL_COMMUNITY): Payer: Self-pay | Admitting: Internal Medicine

## 2023-12-12 VITALS — BP 114/90 | HR 147 | Ht 65.0 in | Wt 169.8 lb

## 2023-12-12 DIAGNOSIS — D6869 Other thrombophilia: Secondary | ICD-10-CM | POA: Diagnosis not present

## 2023-12-12 DIAGNOSIS — I4819 Other persistent atrial fibrillation: Secondary | ICD-10-CM | POA: Diagnosis not present

## 2023-12-12 MED ORDER — DILTIAZEM HCL 30 MG PO TABS: ORAL_TABLET | ORAL | 1 refills | Status: DC

## 2023-12-12 MED ORDER — AMIODARONE HCL 200 MG PO TABS: ORAL_TABLET | ORAL | 3 refills | Status: DC

## 2023-12-12 MED ORDER — DILTIAZEM HCL ER COATED BEADS 300 MG PO CP24
300.0000 mg | ORAL_CAPSULE | Freq: Every day | ORAL | 11 refills | Status: AC
Start: 2023-12-12 — End: ?

## 2023-12-12 MED ORDER — AMIODARONE HCL 200 MG PO TABS: ORAL_TABLET | ORAL | 3 refills | Status: AC

## 2023-12-12 MED ORDER — APIXABAN 5 MG PO TABS: 5.0000 mg | ORAL_TABLET | Freq: Two times a day (BID) | ORAL | 1 refills | Status: DC

## 2023-12-12 NOTE — Progress Notes (Signed)
 Primary Care Physician: Dettinger, Fonda LABOR, MD Primary Cardiologist: Lynwood Schilling, MD Electrophysiologist: None     Referring Physician: Dr. Schilling [name] is a 89 y.o. female with a history of HTN, HLD, and atrial fibrillation who presents for consultation in the [facility]. Patient is on flecainide  50 mg BID for AAD therapy. ED visit on 12/02/23 for Afib; s/p successful DCCV. Patient contacted office on 8/20 noting ERAF. Patient is on Eliquis  5 mg BID for a CHADS2VASC score of 4.  On evaluation today, she is currently in Afib with RVR. Patient feels tired and SOB when in Afib. No missed doses of Eliquis .   Today, she denies symptoms of palpitations, chest pain, shortness of breath, orthopnea, PND, lower extremity edema, dizziness, presyncope, syncope, snoring, daytime somnolence, bleeding, or neurologic sequela. The patient is tolerating medications without difficulties and is otherwise without complaint today.   she has a BMI of Body mass index is 28.26 kg/m.SABRA Filed Weights   12/12/23 0904  Weight: 77 kg    Current Outpatient Medications  Medication Sig Dispense Refill   acetaminophen  (TYLENOL ) 650 MG CR tablet Take 1 tablet (650 mg total) by mouth every 8 (eight) hours as needed for pain. 30 tablet 0   cholecalciferol  (VITAMIN D3) 25 MCG (1000 UT) tablet Take 2,000 Units by mouth 2 (two) times daily.     diclofenac  Sodium (VOLTAREN ) 1 % GEL Apply 2 g topically 4 (four) times daily. 350 g 5   diltiazem  (CARDIZEM ) 30 MG tablet Take 1 Tablet Every 4 Hours As Needed For HR >100 AND TOP BP>100 45 tablet 1   zolpidem  (AMBIEN ) 5 MG tablet Take 1 tablet (5 mg total) by mouth at bedtime as needed for sleep. Put on file until patient asked for refill 30 tablet 5   [START ON 12/16/2023] amiodarone  (PACERONE ) 200 MG tablet Take 1 tablet (200 mg total) by mouth 2 (two) times daily for 30 days, THEN 1 tablet (200 mg total) daily. 180 tablet 3    apixaban  (ELIQUIS ) 5 MG TABS tablet Take 1 tablet (5 mg total) by mouth 2 (two) times daily. 180 tablet 1   diltiazem  (CARDIZEM  CD) 300 MG 24 hr capsule Take 1 capsule (300 mg total) by mouth daily. 30 capsule 11   No current facility-administered medications for this encounter.    Atrial Fibrillation Management history:  Previous antiarrhythmic drugs: flecainide  Previous cardioversions: 12/02/23 Previous ablations: none Anticoagulation history: Eliquis    ROS- All systems are reviewed and negative except as per the HPI above.  Physical Exam: BP (!) 114/90   Pulse (!) 147   Ht 5' 5 (1.651 m)   Wt 77 kg   BMI 28.26 kg/m   GEN: Well nourished, well developed in no acute distress NECK: No JVD; No carotid bruits CARDIAC: Irregularly irregular tachycardic rate and rhythm, no murmurs, rubs, gallops RESPIRATORY:  Clear to auscultation without rales, wheezing or rhonchi  ABDOMEN: Soft, non-tender, non-distended EXTREMITIES:  No edema; No deformity   EKG today demonstrates  Vent. rate 147 BPM PR interval * ms QRS duration 128 ms QT/QTcB 306/478 ms P-R-T axes * 131 -14 Atrial fibrillation with rapid ventricular response Right bundle branch block Abnormal ECG When compared with ECG of 02-Dec-2023 12:35, PREVIOUS ECG IS PRESENT  Echo 05/20/2018 demonstrated  1. Patient was in atrial fibrillation with RVR throughout the study, with  rates between 110-140 bpm. This limits the sensitivity of the study for  diagnostic wall motion abnormalities.   2. The left ventricle appears to be normal in size, has normal wall  thickness 50-55% ejection fraction Spectral Doppler shows NA pattern of  diastolic filling.   3. Right ventricular systolic pressure is is normal.   4. The right ventricle has normal size and normal systolic function.   5. Severely dilated left atrial size.   6. Moderately dilated right atrial size.   7. Mitral valve regurgitation is trivial by color flow Doppler.   8.  The mitral valve normal in structure and function.   9. Normal tricuspid valve.  10. Tricuspid regurgitation moderate.  11. Aortic valve normal.  12. Mild dilatation of the aortic root.  13. No atrial level shunt detected by color flow Doppler.   ASSESSMENT & PLAN CHA2DS2-VASc Score = 4  The patient's score is based upon: CHF History: 0 HTN History: 1 Diabetes History: 0 Stroke History: 0 Vascular Disease History: 0 Age Score: 2 Gender Score: 1       ASSESSMENT AND PLAN: Persistent Atrial Fibrillation (ICD10:  I48.19) The patient's CHA2DS2-VASc score is 4, indicating a 4.8% annual risk of stroke.    Patient is currently in Afib with RVR. She has had ERAF despite recent cardioversion. We discussed options for treatment at this time which include increasing flecainide , tikosyn, or amiodarone . After discussion, patient and son wish to proceed with transitioning to amiodarone . Will stop flecainide , do 3-day wash out period, and begin amiodarone  200 mg BID x 4 weeks then transition to amiodarone  200 mg once daily. Increase diltiazem  to 300 mg once daily. Diltiazem  30 mg PRN.   Secondary Hypercoagulable State (ICD10:  D68.69) The patient is at significant risk for stroke/thromboembolism based upon her CHA2DS2-VASc Score of 4.  Continue Apixaban  (Eliquis ).  No missed doses.   Follow up 2-3 weeks to reassess.    Terra Pac, PA-C  Afib Clinic [facility] [address] [place], [place] [address] [phone]

## 2023-12-12 NOTE — Patient Instructions (Addendum)
 Stop Flecainide  Today  Start Amiodarone  200mg  twice daily on 12/16/2023  Start Diltiazem  300mg  12/13/2023   Take Diltiazem  30mg   Every 4 Hours As Needed For HR >100

## 2023-12-12 NOTE — Addendum Note (Signed)
 Addended by: EZZARD PALMA F on: 12/12/2023 11:52 AM   Modules accepted: Orders

## 2023-12-19 ENCOUNTER — Telehealth (HOSPITAL_COMMUNITY): Payer: Self-pay | Admitting: *Deleted

## 2023-12-19 MED ORDER — FUROSEMIDE 20 MG PO TABS: 20.0000 mg | ORAL_TABLET | Freq: Every day | ORAL | 1 refills | Status: DC | PRN

## 2023-12-19 NOTE — Telephone Encounter (Signed)
 Patients son called reporting  pt has SOB with mild swelling to legs. Hr 93-130 with BP 123/78. I spoke with Thom Heinrich PA. He will order lasix  20 mg x 5 days. I reviewed med plan with son and verbalized understanding. He will call next week with update.

## 2023-12-30 ENCOUNTER — Ambulatory Visit (HOSPITAL_COMMUNITY): Payer: Self-pay | Admitting: Internal Medicine

## 2023-12-30 ENCOUNTER — Ambulatory Visit (HOSPITAL_COMMUNITY)
Admission: RE | Admit: 2023-12-30 | Discharge: 2023-12-30 | Disposition: A | Discharge status: Home / Self Care | Source: Ambulatory Visit | Attending: Internal Medicine | Admitting: Internal Medicine

## 2023-12-30 ENCOUNTER — Encounter (HOSPITAL_COMMUNITY): Payer: Self-pay | Admitting: Internal Medicine

## 2023-12-30 VITALS — BP 124/90 | HR 126 | Ht 65.0 in | Wt 171.8 lb

## 2023-12-30 DIAGNOSIS — I4811 Longstanding persistent atrial fibrillation: Secondary | ICD-10-CM | POA: Diagnosis not present

## 2023-12-30 DIAGNOSIS — Z79899 Other long term (current) drug therapy: Secondary | ICD-10-CM

## 2023-12-30 DIAGNOSIS — D6869 Other thrombophilia: Secondary | ICD-10-CM | POA: Diagnosis not present

## 2023-12-30 DIAGNOSIS — Z5181 Encounter for therapeutic drug level monitoring: Secondary | ICD-10-CM | POA: Diagnosis not present

## 2023-12-30 DIAGNOSIS — I4819 Other persistent atrial fibrillation: Secondary | ICD-10-CM

## 2023-12-30 LAB — BASIC METABOLIC PANEL WITH GFR
BUN/Creatinine Ratio: 16 (ref 12–28)
BUN: 17 mg/dL (ref 8–27)
CO2: 27 mmol/L (ref 20–29)
Calcium: 9.1 mg/dL (ref 8.7–10.3)
Chloride: 100 mmol/L (ref 96–106)
Creatinine, Ser: 1.08 mg/dL — ABNORMAL HIGH (ref 0.57–1.00)
Glucose: 93 mg/dL (ref 70–99)
Potassium: 3.3 mmol/L — ABNORMAL LOW (ref 3.5–5.2)
Sodium: 139 mmol/L (ref 134–144)
eGFR: 49 mL/min/1.73 — ABNORMAL LOW (ref >59)

## 2023-12-30 LAB — CBC
Hematocrit: 37.6 % (ref 34.0–46.6)
Hemoglobin: 12 g/dL (ref 11.1–15.9)
MCH: 27.7 pg (ref 26.6–33.0)
MCHC: 31.9 g/dL (ref 31.5–35.7)
MCV: 87 fL (ref 79–97)
Platelets: 306 x10E3/uL (ref 150–450)
RBC: 4.33 x10E6/uL (ref 3.77–5.28)
RDW: 15.8 % — ABNORMAL HIGH (ref 11.7–15.4)
WBC: 6.8 x10E3/uL (ref 3.4–10.8)

## 2023-12-30 MED ORDER — FUROSEMIDE 20 MG PO TABS: 20.0000 mg | ORAL_TABLET | Freq: Every day | ORAL | 1 refills | Status: DC | PRN

## 2023-12-30 MED ORDER — POTASSIUM CHLORIDE CRYS ER 20 MEQ PO TBCR: 20.0000 meq | EXTENDED_RELEASE_TABLET | Freq: Every day | ORAL | 6 refills | Status: DC

## 2023-12-30 MED ORDER — DILTIAZEM HCL ER COATED BEADS 360 MG PO CP24: 360.0000 mg | ORAL_CAPSULE | Freq: Every day | ORAL | 0 refills | Status: DC

## 2023-12-30 NOTE — Addendum Note (Signed)
 Encounter addended by: Janel Nancy SAUNDERS, RN on: 12/30/2023 2:19 PM  Actions taken: Visit diagnoses modified, Order list changed, Diagnosis association updated

## 2023-12-30 NOTE — H&P (View-Only) (Signed)
 Primary Care Physician: Dettinger, Fonda LABOR, MD Primary Cardiologist: Lynwood Schilling, MD Electrophysiologist: None     Referring Physician: Dr. Schilling Rhonda [name] is a 89 y.o. female with a history of HTN, HLD, and atrial fibrillation who presents for consultation in the [facility]. Patient is on flecainide  50 mg BID for AAD therapy. ED visit on 12/02/23 for Afib; s/p successful DCCV. Patient contacted office on 8/20 noting ERAF. Patient is on Eliquis  5 mg BID for a CHADS2VASC score of 4.  On follow up 12/30/23, patient is currently in Afib with RVR. She began transition from flecainide  to amiodarone  at last visit on 8/21. She notes ongoing SOB and LE edema which was improved with lasix . No missed doses of Eliquis .   Today, she denies symptoms of palpitations, chest pain, orthopnea, PND, dizziness, presyncope, syncope, snoring, daytime somnolence, bleeding, or neurologic sequela. The patient is tolerating medications without difficulties and is otherwise without complaint today.   she has a BMI of Body mass index is 28.59 kg/m.SABRA Filed Weights   12/30/23 0937  Weight: 77.9 kg     Current Outpatient Medications  Medication Sig Dispense Refill   acetaminophen  (TYLENOL ) 650 MG CR tablet Take 1 tablet (650 mg total) by mouth every 8 (eight) hours as needed for pain. 30 tablet 0   amiodarone  (PACERONE ) 200 MG tablet Take 1 tablet (200 mg total) by mouth 2 (two) times daily for 30 days, THEN 1 tablet (200 mg total) daily. 180 tablet 3   apixaban  (ELIQUIS ) 5 MG TABS tablet Take 1 tablet (5 mg total) by mouth 2 (two) times daily. 180 tablet 1   cholecalciferol  (VITAMIN D3) 25 MCG (1000 UT) tablet Take 2,000 Units by mouth 2 (two) times daily.     diclofenac  Sodium (VOLTAREN ) 1 % GEL Apply 2 g topically 4 (four) times daily. 350 g 5   diltiazem  (CARDIZEM  CD) 300 MG 24 hr capsule Take 1 capsule (300 mg total) by mouth daily. 30 capsule 11   diltiazem   (CARDIZEM  CD) 360 MG 24 hr capsule Take 1 capsule (360 mg total) by mouth daily. 10 capsule 0   diltiazem  (CARDIZEM ) 30 MG tablet Take 1 Tablet Every 4 Hours As Needed For HR >100 AND TOP BP>100 45 tablet 1   zolpidem  (AMBIEN ) 5 MG tablet Take 1 tablet (5 mg total) by mouth at bedtime as needed for sleep. Put on file until patient asked for refill 30 tablet 5   furosemide  (LASIX ) 20 MG tablet Take 1 tablet (20 mg total) by mouth daily as needed. 30 tablet 1   No current facility-administered medications for this encounter.    Atrial Fibrillation Management history:  Previous antiarrhythmic drugs: flecainide  Previous cardioversions: 12/02/23 Previous ablations: none Anticoagulation history: Eliquis    ROS- All systems are reviewed and negative except as per the HPI above.  Physical Exam: BP (!) 124/90   Pulse (!) 126   Ht 5' 5 (1.651 m)   Wt 77.9 kg   BMI 28.59 kg/m   GEN- The patient is well appearing, alert and oriented x 3 today.   Neck - no JVD or carotid bruit noted Lungs- Clear to ausculation bilaterally, normal work of breathing Heart- Tachycardic irregular rate and rhythm, no murmurs, rubs or gallops, PMI not laterally displaced Extremities- no clubbing, cyanosis, or edema Skin - no rash or ecchymosis noted   EKG today demonstrates  Vent. rate 126 BPM PR interval * ms QRS duration  88 ms QT/QTcB 274/396 ms P-R-T axes * -17 265 Atrial fibrillation with rapid ventricular response Possible Anterior infarct , age undetermined Abnormal ECG When compared with ECG of 12-Dec-2023 09:06, No significant change was found  Echo 05/20/2018 demonstrated  1. Patient was in atrial fibrillation with RVR throughout the study, with  rates between 110-140 bpm. This limits the sensitivity of the study for  diagnostic wall motion abnormalities.   2. The left ventricle appears to be normal in size, has normal wall  thickness 50-55% ejection fraction Spectral Doppler shows NA pattern  of  diastolic filling.   3. Right ventricular systolic pressure is is normal.   4. The right ventricle has normal size and normal systolic function.   5. Severely dilated left atrial size.   6. Moderately dilated right atrial size.   7. Mitral valve regurgitation is trivial by color flow Doppler.   8. The mitral valve normal in structure and function.   9. Normal tricuspid valve.  10. Tricuspid regurgitation moderate.  11. Aortic valve normal.  12. Mild dilatation of the aortic root.  13. No atrial level shunt detected by color flow Doppler.   ASSESSMENT & PLAN CHA2DS2-VASc Score = 4  The patient's score is based upon: CHF History: 0 HTN History: 1 Diabetes History: 0 Stroke History: 0 Vascular Disease History: 0 Age Score: 2 Gender Score: 1       ASSESSMENT AND PLAN: Persistent Atrial Fibrillation (ICD10:  I48.19) The patient's CHA2DS2-VASc score is 4, indicating a 4.8% annual risk of stroke.    Patient is currently in Afib with RVR. We discussed the procedure cardioversion to try to convert to NSR. We discussed the risks vs benefits of this procedure and how ultimately we cannot predict whether a patient will have early return of arrhythmia post procedure. After discussion, the patient wishes to proceed with cardioversion. Labs drawn today. Increase diltiazem  to 360 mg daily; go back to original dose after cardioversion.  Informed Consent   Shared Decision Making/Informed Consent The risks (stroke, cardiac arrhythmias rarely resulting in the need for a temporary or permanent pacemaker, skin irritation or burns and complications associated with conscious sedation including aspiration, arrhythmia, respiratory failure and death), benefits (restoration of normal sinus rhythm) and alternatives of a direct current cardioversion were explained in detail to Rhonda [name] and she agrees to proceed.         High risk medication monitoring (ICD10: [id number]) Patient requires ongoing  monitoring for anti-arrhythmic medication which has the potential to cause life threatening arrhythmias or AV block. Qtc stable. Continue amiodarone  200 mg BID and transition to 200 mg once daily after cardioversion.    Secondary Hypercoagulable State (ICD10:  D68.69) The patient is at significant risk for stroke/thromboembolism based upon her CHA2DS2-VASc Score of 4.  Continue Apixaban  (Eliquis ).  No missed doses.    Follow up 2 weeks after DCCV.    Terra Pac, PA-C  Afib Clinic [facility] [address] [place], [place] [address] [phone]

## 2023-12-30 NOTE — Progress Notes (Signed)
 Primary Care Physician: [name], Rhonda LABOR, MD Primary Cardiologist: Rhonda Schilling, MD Electrophysiologist: None     Referring Physician: Dr. Schilling Rhonda [name] is a 89 y.o. female with a history of HTN, HLD, and atrial fibrillation who presents for consultation in the [facility]. Patient is on flecainide  50 mg BID for AAD therapy. ED visit on 12/02/23 for Afib; s/p successful DCCV. Patient contacted office on 8/20 noting ERAF. Patient is on Eliquis  5 mg BID for a CHADS2VASC score of 4.  On follow up 12/30/23, patient is currently in Afib with RVR. She began transition from flecainide  to amiodarone  at last visit on 8/21. She notes ongoing SOB and LE edema which was improved with lasix . No missed doses of Eliquis .   Today, she denies symptoms of palpitations, chest pain, orthopnea, PND, dizziness, presyncope, syncope, snoring, daytime somnolence, bleeding, or neurologic sequela. The patient is tolerating medications without difficulties and is otherwise without complaint today.   she has a BMI of Body mass index is 28.59 kg/m.SABRA Filed Weights   12/30/23 0937  Weight: 77.9 kg     Current Outpatient Medications  Medication Sig Dispense Refill   acetaminophen  (TYLENOL ) 650 MG CR tablet Take 1 tablet (650 mg total) by mouth every 8 (eight) hours as needed for pain. 30 tablet 0   amiodarone  (PACERONE ) 200 MG tablet Take 1 tablet (200 mg total) by mouth 2 (two) times daily for 30 days, THEN 1 tablet (200 mg total) daily. 180 tablet 3   apixaban  (ELIQUIS ) 5 MG TABS tablet Take 1 tablet (5 mg total) by mouth 2 (two) times daily. 180 tablet 1   cholecalciferol  (VITAMIN D3) 25 MCG (1000 UT) tablet Take 2,000 Units by mouth 2 (two) times daily.     diclofenac  Sodium (VOLTAREN ) 1 % GEL Apply 2 g topically 4 (four) times daily. 350 g 5   diltiazem  (CARDIZEM  CD) 300 MG 24 hr capsule Take 1 capsule (300 mg total) by mouth daily. 30 capsule 11   diltiazem   (CARDIZEM  CD) 360 MG 24 hr capsule Take 1 capsule (360 mg total) by mouth daily. 10 capsule 0   diltiazem  (CARDIZEM ) 30 MG tablet Take 1 Tablet Every 4 Hours As Needed For HR >100 AND TOP BP>100 45 tablet 1   zolpidem  (AMBIEN ) 5 MG tablet Take 1 tablet (5 mg total) by mouth at bedtime as needed for sleep. Put on file until patient asked for refill 30 tablet 5   furosemide  (LASIX ) 20 MG tablet Take 1 tablet (20 mg total) by mouth daily as needed. 30 tablet 1   No current facility-administered medications for this encounter.    Atrial Fibrillation Management history:  Previous antiarrhythmic drugs: flecainide  Previous cardioversions: 12/02/23 Previous ablations: none Anticoagulation history: Eliquis    ROS- All systems are reviewed and negative except as per the HPI above.  Physical Exam: BP (!) 124/90   Pulse (!) 126   Ht 5' 5 (1.651 m)   Wt 77.9 kg   BMI 28.59 kg/m   GEN- The patient is well appearing, alert and oriented x 3 today.   Neck - no JVD or carotid bruit noted Lungs- Clear to ausculation bilaterally, normal work of breathing Heart- Tachycardic irregular rate and rhythm, no murmurs, rubs or gallops, PMI not laterally displaced Extremities- no clubbing, cyanosis, or edema Skin - no rash or ecchymosis noted   EKG today demonstrates  Vent. rate 126 BPM PR interval * ms QRS duration  88 ms QT/QTcB 274/396 ms P-R-T axes * -17 265 Atrial fibrillation with rapid ventricular response Possible Anterior infarct , age undetermined Abnormal ECG When compared with ECG of 12-Dec-2023 09:06, No significant change was found  Echo 05/20/2018 demonstrated  1. Patient was in atrial fibrillation with RVR throughout the study, with  rates between 110-140 bpm. This limits the sensitivity of the study for  diagnostic wall motion abnormalities.   2. The left ventricle appears to be normal in size, has normal wall  thickness 50-55% ejection fraction Spectral Doppler shows NA pattern  of  diastolic filling.   3. Right ventricular systolic pressure is is normal.   4. The right ventricle has normal size and normal systolic function.   5. Severely dilated left atrial size.   6. Moderately dilated right atrial size.   7. Mitral valve regurgitation is trivial by color flow Doppler.   8. The mitral valve normal in structure and function.   9. Normal tricuspid valve.  10. Tricuspid regurgitation moderate.  11. Aortic valve normal.  12. Mild dilatation of the aortic root.  13. No atrial level shunt detected by color flow Doppler.   ASSESSMENT & PLAN CHA2DS2-VASc Score = 4  The patient's score is based upon: CHF History: 0 HTN History: 1 Diabetes History: 0 Stroke History: 0 Vascular Disease History: 0 Age Score: 2 Gender Score: 1       ASSESSMENT AND PLAN: Persistent Atrial Fibrillation (ICD10:  I48.19) The patient's CHA2DS2-VASc score is 4, indicating a 4.8% annual risk of stroke.    Patient is currently in Afib with RVR. We discussed the procedure cardioversion to try to convert to NSR. We discussed the risks vs benefits of this procedure and how ultimately we cannot predict whether a patient will have early return of arrhythmia post procedure. After discussion, the patient wishes to proceed with cardioversion. Labs drawn today. Increase diltiazem  to 360 mg daily; go back to original dose after cardioversion.  Informed Consent   Shared Decision Making/Informed Consent The risks (stroke, cardiac arrhythmias rarely resulting in the need for a temporary or permanent pacemaker, skin irritation or burns and complications associated with conscious sedation including aspiration, arrhythmia, respiratory failure and death), benefits (restoration of normal sinus rhythm) and alternatives of a direct current cardioversion were explained in detail to Rhonda [name] and she agrees to proceed.         High risk medication monitoring (ICD10: J342684) Patient requires ongoing  monitoring for anti-arrhythmic medication which has the potential to cause life threatening arrhythmias or AV block. Qtc stable. Continue amiodarone  200 mg BID and transition to 200 mg once daily after cardioversion.    Secondary Hypercoagulable State (ICD10:  D68.69) The patient is at significant risk for stroke/thromboembolism based upon her CHA2DS2-VASc Score of 4.  Continue Apixaban  (Eliquis ).  No missed doses.    Follow up 2 weeks after DCCV.    Terra Pac, PA-C  Af[facility] Clinic [facility] [address] [place], [place] [address] [phone]

## 2023-12-30 NOTE — Patient Instructions (Signed)
 Increase cardizem  to 360mg  daily until day of cardioversion then reduce to 300mg     On September 20th - reduce Amiodarone  to 200mg  ONCE a day    Cardioversion scheduled for: Friday, September 19th   - Arrive at the [facility] ([address])  and check in with ADMITTING at 7:30 AM   - Do not eat or drink anything after midnight the night prior to your procedure.   - Take all your morning medication (except diabetic medications) with a sip of water  prior to arrival.  - Do NOT miss any doses of your blood thinner - if you should miss a dose or take a dose more than 4 hours late -- please notify our office immediately.  - You will not be able to drive home after your procedure. Please ensure you have a responsible adult to drive you home. You will need someone with you for 24 hours post procedure.     - Expect to be in the procedural area approximately 2 hours.   - If you feel as if you go back into normal rhythm prior to scheduled cardioversion, please notify our office immediately.   If your procedure is canceled in the cardioversion suite you will be charged a cancellation fee.

## 2024-01-02 ENCOUNTER — Ambulatory Visit (HOSPITAL_COMMUNITY): Admitting: Internal Medicine

## 2024-01-03 ENCOUNTER — Telehealth (HOSPITAL_COMMUNITY): Payer: Self-pay

## 2024-01-03 NOTE — Telephone Encounter (Signed)
 Patient called in regarding her A-fib She is concerned about her blood pressure running low 105/74 and 85/68 and she is short of breath. Per Thom Shy instruct patient to increase Lasix  to 40 mg daily up until her cardioversion. Patient was told if she starts feeling much worse such as light headed, dizziness and having chest pain she was told to go to the ER to evaluated. Consulted with patient and she verbalized understanding.

## 2024-01-09 NOTE — Progress Notes (Signed)
 Spoke to patient and instructed them to come at 0700  and to be NPO after 0000.     Confirmed that patient will have a ride home and someone to stay with them for 24 hours after the procedure.   Medications reviewed.  Confirmed blood thinner.  Confirmed no breaks in taking blood thinner for 3+ weeks prior to procedure.

## 2024-01-10 ENCOUNTER — Ambulatory Visit: Admitting: Nurse Practitioner

## 2024-01-10 ENCOUNTER — Ambulatory Visit (HOSPITAL_COMMUNITY): Admitting: Registered Nurse

## 2024-01-10 ENCOUNTER — Other Ambulatory Visit: Payer: Self-pay

## 2024-01-10 ENCOUNTER — Encounter (HOSPITAL_COMMUNITY)
Admission: RE | Disposition: A | Discharge status: Home / Self Care | Payer: Self-pay | Source: Home / Self Care | Attending: Cardiovascular Disease

## 2024-01-10 ENCOUNTER — Ambulatory Visit (HOSPITAL_COMMUNITY)
Admission: RE | Admit: 2024-01-10 | Discharge: 2024-01-10 | Disposition: A | Discharge status: Home / Self Care | Attending: Cardiovascular Disease | Admitting: Cardiovascular Disease

## 2024-01-10 ENCOUNTER — Ambulatory Visit: Payer: Self-pay | Admitting: Cardiovascular Disease

## 2024-01-10 DIAGNOSIS — E785 Hyperlipidemia, unspecified: Secondary | ICD-10-CM | POA: Diagnosis not present

## 2024-01-10 DIAGNOSIS — I10 Essential (primary) hypertension: Secondary | ICD-10-CM | POA: Insufficient documentation

## 2024-01-10 DIAGNOSIS — I4891 Unspecified atrial fibrillation: Secondary | ICD-10-CM | POA: Diagnosis not present

## 2024-01-10 DIAGNOSIS — Z79899 Other long term (current) drug therapy: Secondary | ICD-10-CM | POA: Diagnosis not present

## 2024-01-10 DIAGNOSIS — Z7901 Long term (current) use of anticoagulants: Secondary | ICD-10-CM | POA: Diagnosis not present

## 2024-01-10 DIAGNOSIS — I4819 Other persistent atrial fibrillation: Secondary | ICD-10-CM | POA: Insufficient documentation

## 2024-01-10 DIAGNOSIS — D6869 Other thrombophilia: Secondary | ICD-10-CM | POA: Diagnosis not present

## 2024-01-10 HISTORY — PX: CARDIOVERSION: EP1203

## 2024-01-10 LAB — POCT I-STAT, CHEM 8
BUN: 21 mg/dL (ref 8–23)
Calcium, Ion: 1.15 mmol/L (ref 1.15–1.40)
Chloride: 99 mmol/L (ref 98–111)
Creatinine, Ser: 1.3 mg/dL — ABNORMAL HIGH (ref 0.44–1.00)
Glucose, Bld: 88 mg/dL (ref 70–99)
HCT: 37 % (ref 36.0–46.0)
Hemoglobin: 12.6 g/dL (ref 12.0–15.0)
Potassium: 3.6 mmol/L (ref 3.5–5.1)
Sodium: 139 mmol/L (ref 135–145)
TCO2: 24 mmol/L (ref 22–32)

## 2024-01-10 SURGERY — CARDIOVERSION (CATH LAB)
Anesthesia: General

## 2024-01-10 MED ORDER — LIDOCAINE 2% (20 MG/ML) 5 ML SYRINGE
INTRAMUSCULAR | Status: DC | PRN
  Administered 2024-01-10: 100 mg via INTRAVENOUS

## 2024-01-10 MED ORDER — PROPOFOL 10 MG/ML IV BOLUS
INTRAVENOUS | Status: DC | PRN
  Administered 2024-01-10: 40 mg via INTRAVENOUS

## 2024-01-10 SURGICAL SUPPLY — 1 items: PAD DEFIB RADIO PHYSIO CONN (PAD) ×1 IMPLANT

## 2024-01-10 NOTE — Interval H&P Note (Signed)
 History and Physical Interval Note:  01/10/2024 8:30 AM  Rhonda [name]  has presented today for surgery, with the diagnosis of AFIB.  The various methods of treatment have been discussed with the patient and family. After consideration of risks, benefits and other options for treatment, the patient has consented to  Procedure(s): CARDIOVERSION (N/A) as a surgical intervention.  The patient's history has been reviewed, patient examined, no change in status, stable for surgery.  I have reviewed the patient's chart and labs.  Questions were answered to the patient's satisfaction.     Loredana Medellin

## 2024-01-10 NOTE — Progress Notes (Signed)
 Discharge instruction reviewed w/patient and given to her.

## 2024-01-10 NOTE — Anesthesia Postprocedure Evaluation (Signed)
 Anesthesia Post Note  Patient: Rhonda [name]  Procedure(s) Performed: CARDIOVERSION     Patient location during evaluation: PACU Anesthesia Type: General Level of consciousness: awake and alert Pain management: pain level controlled Vital Signs Assessment: post-procedure vital signs reviewed and stable Respiratory status: spontaneous breathing, nonlabored ventilation, respiratory function stable and patient connected to nasal cannula oxygen Cardiovascular status: blood pressure returned to baseline and stable Postop Assessment: no apparent nausea or vomiting Anesthetic complications: no   No notable events documented.  Last Vitals:  Vitals:   01/10/24 0905 01/10/24 0910  BP: 105/72 112/68  Pulse: 74 73  Resp: (!) 31 (!) 27  Temp:    SpO2: 94% 93%    Last Pain:  Vitals:   01/10/24 0913  TempSrc:   PainSc: 0-No pain                 Lynwood MARLA Cornea

## 2024-01-10 NOTE — Transfer of Care (Signed)
 Immediate Anesthesia Transfer of Care Note  Patient: Rhonda [name]  Procedure(s) Performed: CARDIOVERSION  Patient Location: PACU  Anesthesia Type:MAC  Level of Consciousness: drowsy  Airway & Oxygen Therapy: Patient connected to nasal cannula oxygen  Post-op Assessment: Report given to RN and Post -op Vital signs reviewed and stable  Post vital signs: Reviewed and stable  Last Vitals:  Vitals Value Taken Time  BP 97/60 0842  Temp    Pulse 69 0842  Resp 10 0842  SpO2 93% 0842    Last Pain:  Vitals:   01/10/24 0744  TempSrc: Temporal  PainSc: 0-No pain         Complications: No notable events documented.

## 2024-01-10 NOTE — Op Note (Signed)
 Procedure: Electrical Cardioversion Indications:  Atrial Fibrillation  Procedure Details:  Consent: Risks of procedure as well as the alternatives and risks of each were explained to the (patient/caregiver).  Consent for procedure obtained.  Time Out: Verified patient identification, verified procedure, site/side was marked, verified correct patient position, special equipment/implants available, medications/allergies/relevent history reviewed, required imaging and test results available.  Performed  Patient placed on cardiac monitor, pulse oximetry, supplemental oxygen as necessary.  Sedation given: propofol  IV, Dr. Keneth Pacer pads placed anterior and posterior chest.  Cardioverted 1 time(s).  Cardioversion with synchronized biphasic 200J shock.  Evaluation: Findings: Post procedure EKG shows: NSR Complications: None Patient did tolerate procedure well.  Time Spent Directly with the Patient:  30 minutes   Rhonda [name] 01/10/2024, 8:41 AM

## 2024-01-10 NOTE — Anesthesia Preprocedure Evaluation (Signed)
 Anesthesia Evaluation  Patient identified by MRN, date of birth, ID band Patient awake    Reviewed: Allergy & Precautions, H&P , NPO status , Patient's Chart, lab work & pertinent test results  History of Anesthesia Complications Negative for: history of anesthetic complications  Airway Mallampati: II  TM Distance: >3 FB Neck ROM: Full    Dental no notable dental hx.    Pulmonary neg pulmonary ROS   Pulmonary exam normal breath sounds clear to auscultation       Cardiovascular hypertension, (-) angina (-) Past MI Normal cardiovascular exam+ dysrhythmias Atrial Fibrillation  Rhythm:Regular Rate:Normal  TTE 2020: only echo in system, patient was in RVR at the time of exam:  IMPRESSIONS     1. Patient was in atrial fibrillation with RVR throughout the study, with  rates between 110-140 bpm. This limits the sensitivity of the study for  diagnostic wall motion abnormalities.   2. The left ventricle appears to be normal in size, has normal wall  thickness 50-55% ejection fraction Spectral Doppler shows NA pattern of  diastolic filling.   3. Right ventricular systolic pressure is is normal.   4. The right ventricle has normal size and normal systolic function.   5. Severely dilated left atrial size.   6. Moderately dilated right atrial size.   7. Mitral valve regurgitation is trivial by color flow Doppler.   8. The mitral valve normal in structure and function.   9. Normal tricuspid valve.  10. Tricuspid regurgitation moderate.  11. Aortic valve normal.  12. Mild dilatation of the aortic root.  13. No atrial level shunt detected by color flow Doppler.     Neuro/Psych  negative psych ROS   GI/Hepatic negative GI ROS, Neg liver ROS,neg GERD  ,,  Endo/Other  negative endocrine ROS    Renal/GU negative Renal ROS  negative genitourinary   Musculoskeletal  (+) Arthritis ,    Abdominal   Peds negative pediatric ROS (+)   Hematology negative hematology ROS (+)   Anesthesia Other Findings   Reproductive/Obstetrics negative OB ROS                              Anesthesia Physical Anesthesia Plan  ASA: 3  Anesthesia Plan: General   Post-op Pain Management:    Induction: Intravenous  PONV Risk Score and Plan: 3 and Treatment may vary due to age or medical condition  Airway Management Planned: Natural Airway and Simple Face Mask  Additional Equipment: None  Intra-op Plan:   Post-operative Plan:   Informed Consent: I have reviewed the patients History and Physical, chart, labs and discussed the procedure including the risks, benefits and alternatives for the proposed anesthesia with the patient or authorized representative who has indicated his/her understanding and acceptance.     Dental advisory given  Plan Discussed with: CRNA  Anesthesia Plan Comments:          Anesthesia Quick Evaluation

## 2024-01-12 ENCOUNTER — Encounter (HOSPITAL_COMMUNITY): Payer: Self-pay | Admitting: Cardiovascular Disease

## 2024-01-20 ENCOUNTER — Encounter (HOSPITAL_COMMUNITY): Payer: Self-pay | Admitting: Internal Medicine

## 2024-01-20 ENCOUNTER — Ambulatory Visit (HOSPITAL_COMMUNITY)
Admission: RE | Admit: 2024-01-20 | Discharge: 2024-01-20 | Disposition: A | Discharge status: Home / Self Care | Source: Ambulatory Visit | Attending: Internal Medicine | Admitting: Internal Medicine

## 2024-01-20 VITALS — BP 152/80 | HR 70 | Ht 66.0 in | Wt 160.6 lb

## 2024-01-20 DIAGNOSIS — Z79899 Other long term (current) drug therapy: Secondary | ICD-10-CM

## 2024-01-20 DIAGNOSIS — I4819 Other persistent atrial fibrillation: Secondary | ICD-10-CM

## 2024-01-20 DIAGNOSIS — Z5181 Encounter for therapeutic drug level monitoring: Secondary | ICD-10-CM

## 2024-01-20 DIAGNOSIS — D6869 Other thrombophilia: Secondary | ICD-10-CM | POA: Diagnosis not present

## 2024-01-20 NOTE — Progress Notes (Addendum)
 Primary Care Physician: Dettinger, Fonda LABOR, MD Primary Cardiologist: Lynwood Schilling, MD Electrophysiologist: None     Referring Physician: Dr. Schilling Rhonda [name] is a 89 y.o. female with a history of HTN, HLD, and atrial fibrillation who presents for consultation in the [facility]. Patient is on flecainide  50 mg BID for AAD therapy. ED visit on 12/02/23 for Afib; s/p successful DCCV. Patient contacted office on 8/20 noting ERAF. Patient is on Eliquis  5 mg BID for a CHADS2VASC score of 4.  On follow up 12/30/23, patient is currently in Afib with RVR. She began transition from flecainide  to amiodarone  at last visit on 8/21. She notes ongoing SOB and LE edema which was improved with lasix . No missed doses of Eliquis .   On follow up 01/20/24, patient is currently in NSR. S/p successful DCCV on 01/10/24. Patient is taking amiodarone  200 mg daily. She notes ongoing bilateral ankle swelling. Her weight is down significantly compared to last visit from 77.9 kg to 72.8 kg. She is taking diltiazem  360 mg daily. No missed doses of Eliquis .   Today, she denies symptoms of palpitations, chest pain, orthopnea, PND, dizziness, presyncope, syncope, snoring, daytime somnolence, bleeding, or neurologic sequela. The patient is tolerating medications without difficulties and is otherwise without complaint today.   she has a BMI of Body mass index is 25.92 kg/m.SABRA Filed Weights   01/20/24 0923  Weight: 72.8 kg      Current Outpatient Medications  Medication Sig Dispense Refill   acetaminophen  (TYLENOL ) 650 MG CR tablet Take 1 tablet (650 mg total) by mouth every 8 (eight) hours as needed for pain. 30 tablet 0   amiodarone  (PACERONE ) 200 MG tablet Take 1 tablet (200 mg total) by mouth 2 (two) times daily for 30 days, THEN 1 tablet (200 mg total) daily. 180 tablet 3   apixaban  (ELIQUIS ) 5 MG TABS tablet Take 1 tablet (5 mg total) by mouth 2 (two) times daily. 180  tablet 1   Cholecalciferol  (VITAMIN D ) 50 MCG (2000 UT) tablet Take 2,000 Units by mouth 2 (two) times daily.     diclofenac  Sodium (VOLTAREN ) 1 % GEL Apply 2 g topically 4 (four) times daily. (Patient taking differently: Apply 2 g topically daily as needed (Pain).) 350 g 5   diltiazem  (CARDIZEM  CD) 300 MG 24 hr capsule Take 1 capsule (300 mg total) by mouth daily. 30 capsule 11   diltiazem  (CARDIZEM ) 30 MG tablet Take 1 Tablet Every 4 Hours As Needed For HR >100 AND TOP BP>100 45 tablet 1   furosemide  (LASIX ) 20 MG tablet Take 1 tablet (20 mg total) by mouth daily as needed. 30 tablet 1   potassium chloride  SA (KLOR-CON  M) 20 MEQ tablet Take 1 tablet (20 mEq total) by mouth daily. 30 tablet 6   zolpidem  (AMBIEN ) 5 MG tablet Take 1 tablet (5 mg total) by mouth at bedtime as needed for sleep. Put on file until patient asked for refill (Patient taking differently: Take 5 mg by mouth at bedtime. Put on file until patient asked for refill) 30 tablet 5   No current facility-administered medications for this encounter.    Atrial Fibrillation Management history:  Previous antiarrhythmic drugs: flecainide , amiodarone  Previous cardioversions: 12/02/23, 01/10/24 Previous ablations: none Anticoagulation history: Eliquis    ROS- All systems are reviewed and negative except as per the HPI above.  Physical Exam: BP (!) 152/80   Pulse 70   Ht 5' 6 (1.676 m)  Wt 72.8 kg   BMI 25.92 kg/m   GEN- The patient is well appearing, alert and oriented x 3 today.   Neck - no JVD or carotid bruit noted Lungs- Clear to ausculation bilaterally, normal work of breathing Heart- Regular rate and rhythm, no murmurs, rubs or gallops, PMI not laterally displaced Extremities- +1 bilateral ankle edema Skin - no rash or ecchymosis noted   EKG today demonstrates  Vent. rate 70 BPM PR interval 196 ms QRS duration 92 ms QT/QTcB 448/483 ms P-R-T axes 66 -17 14 Normal sinus rhythm Possible Anterior infarct , age  undetermined Abnormal ECG When compared with ECG of 10-Jan-2024 09:08, No significant change was found  Echo 05/20/2018 demonstrated  1. Patient was in atrial fibrillation with RVR throughout the study, with  rates between 110-140 bpm. This limits the sensitivity of the study for  diagnostic wall motion abnormalities.   2. The left ventricle appears to be normal in size, has normal wall  thickness 50-55% ejection fraction Spectral Doppler shows NA pattern of  diastolic filling.   3. Right ventricular systolic pressure is is normal.   4. The right ventricle has normal size and normal systolic function.   5. Severely dilated left atrial size.   6. Moderately dilated right atrial size.   7. Mitral valve regurgitation is trivial by color flow Doppler.   8. The mitral valve normal in structure and function.   9. Normal tricuspid valve.  10. Tricuspid regurgitation moderate.  11. Aortic valve normal.  12. Mild dilatation of the aortic root.  13. No atrial level shunt detected by color flow Doppler.   ASSESSMENT & PLAN CHA2DS2-VASc Score = 4  The patient's score is based upon: CHF History: 0 HTN History: 1 Diabetes History: 0 Stroke History: 0 Vascular Disease History: 0 Age Score: 2 Gender Score: 1       ASSESSMENT AND PLAN: Persistent Atrial Fibrillation (ICD10:  I48.19) The patient's CHA2DS2-VASc score is 4, indicating a 4.8% annual risk of stroke.   S/p DCCV on 01/10/24.  Patient is currently in NSR. I will have patient go back to previous dose of diltiazem  300 mg daily in case the higher dose is helping to promote LE swelling. Will check Bmet today due to increased creatinine previously noted.   High risk medication monitoring (ICD10: U5195107) Patient requires ongoing monitoring for anti-arrhythmic medication which has the potential to cause life threatening arrhythmias or AV block. Qtc stable. Continue amiodarone  200 mg daily.   Secondary Hypercoagulable State (ICD10:   D68.69) The patient is at significant risk for stroke/thromboembolism based upon her CHA2DS2-VASc Score of 4.  Continue Apixaban  (Eliquis ).  No missed doses.    Follow up as scheduled with Dr. Lavona. Follow up 6 months from then with Afib clinic for amiodarone  surveillance.     Terra Pac, PA-C  Af[facility] Clinic [facility] [address] [place], [place] [address] [phone]

## 2024-01-20 NOTE — Patient Instructions (Signed)
 Stop Diltiazem  360 mg  Start Diltiazem  300 mg daily

## 2024-01-21 ENCOUNTER — Ambulatory Visit (HOSPITAL_COMMUNITY): Payer: Self-pay | Admitting: Internal Medicine

## 2024-01-21 LAB — BASIC METABOLIC PANEL WITH GFR
BUN/Creatinine Ratio: 14 (ref 12–28)
BUN: 14 mg/dL (ref 8–27)
CO2: 23 mmol/L (ref 20–29)
Calcium: 9.3 mg/dL (ref 8.7–10.3)
Chloride: 97 mmol/L (ref 96–106)
Creatinine, Ser: 1.03 mg/dL — ABNORMAL HIGH (ref 0.57–1.00)
Glucose: 77 mg/dL (ref 70–99)
Potassium: 4.1 mmol/L (ref 3.5–5.2)
Sodium: 137 mmol/L (ref 134–144)
eGFR: 52 mL/min/1.73 — ABNORMAL LOW (ref >59)

## 2024-02-19 DIAGNOSIS — M25512 Pain in left shoulder: Secondary | ICD-10-CM | POA: Diagnosis not present

## 2024-02-26 ENCOUNTER — Ambulatory Visit: Admitting: Family Medicine

## 2024-02-26 VITALS — BP 108/68 | HR 77 | Temp 97.9°F | Ht 66.0 in | Wt 152.0 lb

## 2024-02-26 DIAGNOSIS — J019 Acute sinusitis, unspecified: Secondary | ICD-10-CM | POA: Diagnosis not present

## 2024-02-26 DIAGNOSIS — B9689 Other specified bacterial agents as the cause of diseases classified elsewhere: Secondary | ICD-10-CM | POA: Diagnosis not present

## 2024-02-26 DIAGNOSIS — B370 Candidal stomatitis: Secondary | ICD-10-CM | POA: Diagnosis not present

## 2024-02-26 MED ORDER — AMOXICILLIN-POT CLAVULANATE 875-125 MG PO TABS: 1.0000 | ORAL_TABLET | Freq: Two times a day (BID) | ORAL | 0 refills | Status: AC

## 2024-02-26 MED ORDER — NYSTATIN 100000 UNIT/ML MT SUSP: 5.0000 mL | Freq: Four times a day (QID) | OROMUCOSAL | 1 refills | Status: AC

## 2024-02-26 NOTE — Patient Instructions (Signed)
 Follow up if symptoms acutely worsen, no improvement over the next 2-3 days, fever develops, or for any other concerns

## 2024-02-26 NOTE — Progress Notes (Signed)
 Acute Office Visit  Patient ID: Rhonda [name], female    DOB: 1933-02-04, 89 y.o.   MRN: [id number]  PCP: Dettinger, Fonda LABOR, MD  Chief Complaint  Patient presents with   SICK VISIT    Patient reports sore throat, cough, congestion, sneezing, and headache for about a week or more. Patient says she has used OTC cough drops and Coricidin brand for nasal congestion but only gets little relief.     Subjective:     HPI  Discussed the use of AI scribe software for clinical note transcription with the patient, who gave verbal consent to proceed.  History of Present Illness   Rhonda [name] is an 89 year old female who presents with sore throat, cough, and congestion for 1-1.5 weeks.   Upper respiratory symptoms - Sore throat present for over one week, worsens in the morning and remains bothersome despite symptomatic treatment - Cough, congestion, sneezing, and headache x 1-1.5 weeks.  - Sensation of postnasal drainage - Large amount mucous - No fever  Otolaryngologic symptoms - Ears feel clogged - Facial tenderness, especially with pressure over certain areas  Symptom management - Using over-the-counter congestion medicine and cough drops - Gargling with warm salt water   Medical history - No recent use of antibiotics - No known allergies to antibiotics       ROS     Objective:    BP 108/68   Pulse 77   Temp 97.9 F (36.6 C)   Ht 5' 6 (1.676 m)   Wt 152 lb (68.9 kg)   SpO2 97%   BMI 24.53 kg/m    Physical Exam Vitals reviewed.  Constitutional:      Appearance: Normal appearance.  HENT:     Head: Normocephalic and atraumatic.     Comments: Pain with palpation of the maxillary and frontal sinuses    Right Ear: Tympanic membrane, ear canal and external ear normal.     Left Ear: Tympanic membrane, ear canal and external ear normal.     Nose: Nose normal.     Mouth/Throat:     Comments: White patches on tongue and buccal mucosa Eyes:      Extraocular Movements: Extraocular movements intact.     Conjunctiva/sclera: Conjunctivae normal.     Pupils: Pupils are equal, round, and reactive to light.  Cardiovascular:     Rate and Rhythm: Normal rate and regular rhythm.     Pulses: Normal pulses.     Heart sounds: Normal heart sounds. No murmur heard. Pulmonary:     Effort: Pulmonary effort is normal.     Breath sounds: Normal breath sounds.  Musculoskeletal:        General: No deformity. Normal range of motion.     Cervical back: Normal range of motion.  Skin:    General: Skin is warm and dry.     Capillary Refill: Capillary refill takes less than 2 seconds.  Neurological:     General: No focal deficit present.     Mental Status: She is alert and oriented to person, place, and time.  Psychiatric:        Mood and Affect: Mood normal.        Behavior: Behavior normal.       No results found for any visits on 02/26/24.     Assessment & Plan:   Problem List Items Addressed This Visit   None Visit Diagnoses       Acute bacterial sinusitis    -  Primary   Relevant Medications   amoxicillin -clavulanate (AUGMENTIN ) 875-125 MG tablet   nystatin (MYCOSTATIN) 100000 UNIT/ML suspension     Thrush       Relevant Medications   nystatin (MYCOSTATIN) 100000 UNIT/ML suspension       Assessment and Plan    Acute sinusitis. - Prescribed augmentin  BID x 7 days - Follow up if symptoms acutely worsen, no improvement over the next 2-3 days, fever develops, or for any other concerns   Oral candidiasis (thrush) White patches in the mouth indicate oral candidiasis. - Prescribed antifungal mouthwash four times daily x 10 days. - Follow up if symptoms acutely worsen, no improvement over the next 2-3 days, fever develops, or for any other concerns        Meds ordered this encounter  Medications   amoxicillin -clavulanate (AUGMENTIN ) 875-125 MG tablet    Sig: Take 1 tablet by mouth 2 (two) times daily for 7 days.     Dispense:  14 tablet    Refill:  0    Supervising Provider:   GOTTSCHALK, ASHLY M [[id number]]   nystatin (MYCOSTATIN) 100000 UNIT/ML suspension    Sig: Take 5 mLs (500,000 Units total) by mouth 4 (four) times daily for 10 days.    Dispense:  60 mL    Refill:  1    Supervising Provider:   JOLINDA NORENE HERO [[id number]]    Return if symptoms worsen or fail to improve.  Oneil LELON Severin, FNP [facility]

## 2024-03-10 ENCOUNTER — Ambulatory Visit: Payer: Self-pay

## 2024-03-10 VITALS — BP 108/68 | HR 77 | Ht 66.0 in | Wt 152.0 lb

## 2024-03-10 DIAGNOSIS — Z0000 Encounter for general adult medical examination without abnormal findings: Secondary | ICD-10-CM | POA: Diagnosis not present

## 2024-03-10 NOTE — Progress Notes (Signed)
 Chief Complaint  Patient presents with   Medicare Wellness     Subjective:   Rhonda [name] is a 89 y.o. female who presents for a Medicare Annual Wellness Visit.  Allergies (verified) Patient has no known allergies.   History: Past Medical History:  Diagnosis Date   Arthritis    Atrial fibrillation (HCC) 05/2015   Cataract    Dysrhythmia    afib   Hypertension    Osteopenia    Syncope 04/2014   Past Surgical History:  Procedure Laterality Date   BREAST EXCISIONAL BIOPSY Left    BREAST EXCISIONAL BIOPSY Right    CARDIOVERSION N/A 01/10/2024   Procedure: CARDIOVERSION;  Surgeon: Francyne Headland, MD;  Location: [facility];  Service: Cardiovascular;  Laterality: N/A;   COLONOSCOPY  2011   KNEE ARTHROCENTESIS Bilateral    KNEE ARTHROSCOPY     bil   POLYPECTOMY  11-01-2009   ROTATOR CUFF REPAIR     SHOULDER SURGERY Left    TOTAL KNEE ARTHROPLASTY Left    TOTAL KNEE ARTHROPLASTY Right 10/12/2019   Procedure: TOTAL KNEE ARTHROPLASTY;  Surgeon: Melodi Lerner, MD;  Location: [facility];  Service: Orthopedics;  Laterality: Right;    VAGINAL HYSTERECTOMY     twice - vaginal and then oopherectomy   Family History  Problem Relation Age of Onset   Heart failure Mother    Hypertension Father    Arthritis Father    Hypertension Sister    Arthritis Sister    Cancer Brother    Bladder Cancer Brother    Hypertension Brother    Diabetes Brother    Social History   Occupational History   Occupation: retired  Tobacco Use   Smoking status: Never   Smokeless tobacco: Never   Tobacco comments:    Never smoked 12/12/23  Vaping Use   Vaping status: Never Used  Substance and Sexual Activity   Alcohol use: No    Alcohol/week: 0.0 standard drinks of alcohol   Drug use: No   Sexual activity: Not Currently   Tobacco Counseling Counseling given: Yes Tobacco comments: Never smoked 12/12/23  SDOH Screenings   Food Insecurity: No Food Insecurity (03/10/2024)   Housing: Unknown (03/10/2024)  Transportation Needs: No Transportation Needs (03/10/2024)  Utilities: Not At Risk (03/10/2024)  Alcohol Screen: Low Risk  (03/07/2023)  Depression (PHQ2-9): Low Risk  (03/10/2024)  Financial Resource Strain: Low Risk  (03/07/2023)  Physical Activity: Insufficiently Active (03/10/2024)  Social Connections: Moderately Integrated (03/10/2024)  Stress: No Stress Concern Present (03/10/2024)  Tobacco Use: Low Risk  (03/10/2024)  Health Literacy: Patient Unable To Answer (03/10/2024)   See flowsheets for full screening details  Depression Screen PHQ 2 & 9 Depression Scale- Over the past 2 weeks, how often have you been bothered by any of the following problems? Little interest or pleasure in doing things: 0 Feeling down, depressed, or hopeless (PHQ Adolescent also includes...irritable): 0 PHQ-2 Total Score: 0 Trouble falling or staying asleep, or sleeping too much: 0 Feeling tired or having little energy: 0 Poor appetite or overeating (PHQ Adolescent also includes...weight loss): 0 Feeling bad about yourself - or that you are a failure or have let yourself or your family down: 0 Trouble concentrating on things, such as reading the newspaper or watching television (PHQ Adolescent also includes...like school work): 0 Moving or speaking so slowly that other people could have noticed. Or the opposite - being so fidgety or restless that you have been moving around a lot  more than usual: 0 Thoughts that you would be better off dead, or of hurting yourself in some way: 0 PHQ-9 Total Score: 0 If you checked off any problems, how difficult have these problems made it for you to do your work, take care of things at home, or get along with other people?: Not difficult at all     Goals Addressed               This Visit's Progress     DIET - INCREASE WATER  INTAKE   On track     Try to drink 6-8 glasses of water  daily      Patient Stated (pt-stated)   On track      Get back in exercise routine since having knee surgery       Visit info / Clinical Intake: Medicare Wellness Visit Type:: Subsequent Annual Wellness Visit Persons participating in visit:: patient Medicare Wellness Visit Mode:: Telephone If telephone:: video declined Because this visit was a virtual/telehealth visit:: vitals recorded from last visit If Telephone or Video please confirm:: I connected with the patient using audio enabled telemedicine application and verified that I am speaking with the correct person using two identifiers Patient Location:: home Provider Location:: home office Information given by:: patient Interpreter Needed?: No Pre-visit prep was completed: yes AWV questionnaire completed by patient prior to visit?: no Living arrangements:: (!) lives alone Patient's Overall Health Status Rating: very good Typical amount of pain: none Does pain affect daily life?: no Are you currently prescribed opioids?: no  Dietary Habits and Nutritional Risks How many meals a day?: 3 Eats fruit and vegetables daily?: yes Most meals are obtained by: preparing own meals Diabetic:: no  Functional Status Activities of Daily Living (to include ambulation/medication): Independent Ambulation: Independent Medication Administration: Independent Home Management: Independent Manage your own finances?: yes Primary transportation is: driving Concerns about hearing?: no  Fall Screening Falls in the past year?: 0 Number of falls in past year: 0 Was there an injury with Fall?: 0 Fall Risk Category Calculator: 0 Patient Fall Risk Level: Low Fall Risk  Fall Risk Patient at Risk for Falls Due to: No Fall Risks Fall risk Follow up: Falls evaluation completed; Education provided  Home and Transportation Safety: All rugs have non-skid backing?: yes All stairs or steps have railings?: yes Grab bars in the bathtub or shower?: yes Have non-skid surface in bathtub or shower?:  yes Good home lighting?: yes Regular seat belt use?: yes Hospital stays in the last year:: (!) yes How many hospital stays:: 1 Reason: Afib  Cognitive Assessment Difficulty concentrating, remembering, or making decisions? : yes Will 6CIT or Mini Cog be Completed: yes What year is it?: 0 points What month is it?: 0 points Give patient an address phrase to remember (5 components): [address] [place] About what time is it?: 0 points Count backwards from 20 to 1: 0 points Say the months of the year in reverse: 0 points Repeat the address phrase from earlier: 0 points 6 CIT Score: 0 points  Advance Directives (For Healthcare) Does Patient Have a Medical Advance Directive?: No Would patient like information on creating a medical advance directive?: No - Patient declined  Reviewed/Updated  Reviewed/Updated: Reviewed All (Medical, Surgical, Family, Medications, Allergies, Care Teams, Patient Goals); Medical History; Surgical History; Family History; Medications; Allergies; Care Teams; Patient Goals        Objective:    Today's Vitals   03/10/24 1107  BP: 108/68  Pulse: 77  Weight: 152 lb (68.9 kg)  Height: 5' 6 (1.676 m)   Body mass index is 24.53 kg/m.  Current Medications (verified) Outpatient Encounter Medications as of 03/10/2024  Medication Sig   acetaminophen  (TYLENOL ) 650 MG CR tablet Take 1 tablet (650 mg total) by mouth every 8 (eight) hours as needed for pain.   amiodarone  (PACERONE ) 200 MG tablet Take 1 tablet (200 mg total) by mouth 2 (two) times daily for 30 days, THEN 1 tablet (200 mg total) daily.   apixaban  (ELIQUIS ) 5 MG TABS tablet Take 1 tablet (5 mg total) by mouth 2 (two) times daily.   Cholecalciferol  (VITAMIN D ) 50 MCG (2000 UT) tablet Take 2,000 Units by mouth 2 (two) times daily.   diclofenac  Sodium (VOLTAREN ) 1 % GEL Apply 2 g topically 4 (four) times daily. (Patient taking differently: Apply 2 g topically daily as needed (Pain).)   diltiazem   (CARDIZEM  CD) 300 MG 24 hr capsule Take 1 capsule (300 mg total) by mouth daily.   diltiazem  (CARDIZEM ) 30 MG tablet Take 1 Tablet Every 4 Hours As Needed For HR >100 AND TOP BP>100   diltiazem  (TIAZAC ) 360 MG 24 hr capsule Take 360 mg by mouth daily.   furosemide  (LASIX ) 20 MG tablet Take 1 tablet (20 mg total) by mouth daily as needed.   potassium chloride  SA (KLOR-CON  M) 20 MEQ tablet Take 1 tablet (20 mEq total) by mouth daily.   zolpidem  (AMBIEN ) 5 MG tablet Take 1 tablet (5 mg total) by mouth at bedtime as needed for sleep. Put on file until patient asked for refill (Patient taking differently: Take 5 mg by mouth at bedtime. Put on file until patient asked for refill)   No facility-administered encounter medications on file as of 03/10/2024.   Hearing/Vision screen Hearing Screening - Comments:: Pt have hearing dif Vision Screening - Comments:: Pt wear glasses/pt goes to [facility] in [place], [place]/last ov 2024 Immunizations and Health Maintenance Health Maintenance  Topic Date Due   DTaP/Tdap/Td (1 - Tdap) 03/12/2024 (Originally 11/27/1951)   Influenza Vaccine  07/21/2024 (Originally 11/22/2023)   COVID-19 Vaccine (8 - 2025-26 season) 03/13/2025 (Originally 12/23/2023)   DEXA SCAN  03/12/2024   Medicare Annual Wellness (AWV)  03/10/2025   Pneumococcal Vaccine: 50+ Years  Completed   Zoster Vaccines- Shingrix   Completed   Meningococcal B Vaccine  Aged Out        Assessment/Plan:  This is a routine wellness examination for Rhonda [name].  Patient Care Team: Dettinger, Fonda LABOR, MD as PCP - General (Family Medicine) Lavona Agent, MD as PCP - Cardiology (Cardiology) Lavona Agent, MD as Consulting Physician (Cardiology) Heide Ingle, MD as Consulting Physician (Orthopedic Surgery)  I have personally reviewed and noted the following in the patient's chart:   Medical and social history Use of alcohol, tobacco or illicit drugs  Current medications and supplements including opioid  prescriptions. Functional ability and status Nutritional status Physical activity Advanced directives List of other physicians Hospitalizations, surgeries, and ER visits in previous 12 months Vitals Screenings to include cognitive, depression, and falls Referrals and appointments  No orders of the defined types were placed in this encounter.  In addition, I have reviewed and discussed with patient certain preventive protocols, quality metrics, and best practice recommendations. A written personalized care plan for preventive services as well as general preventive health recommendations were provided to patient.   Rhonda [name], CMA   03/10/2024   Return in 1 year (on 03/10/2025).  After Visit Summary: (MyChart) Due  to this being a telephonic visit, the after visit summary with patients personalized plan was offered to patient via MyChart   Nurse Notes: n/a

## 2024-03-13 ENCOUNTER — Ambulatory Visit: Payer: Self-pay | Admitting: Family Medicine

## 2024-03-13 ENCOUNTER — Encounter: Payer: Self-pay | Admitting: Family Medicine

## 2024-03-13 VITALS — BP 111/60 | HR 69 | Temp 97.0°F | Ht 66.0 in | Wt 153.6 lb

## 2024-03-13 DIAGNOSIS — Z23 Encounter for immunization: Secondary | ICD-10-CM | POA: Diagnosis not present

## 2024-03-13 DIAGNOSIS — Z0000 Encounter for general adult medical examination without abnormal findings: Secondary | ICD-10-CM | POA: Diagnosis not present

## 2024-03-13 DIAGNOSIS — F5101 Primary insomnia: Secondary | ICD-10-CM

## 2024-03-13 DIAGNOSIS — I10 Essential (primary) hypertension: Secondary | ICD-10-CM | POA: Diagnosis not present

## 2024-03-13 DIAGNOSIS — Z0001 Encounter for general adult medical examination with abnormal findings: Secondary | ICD-10-CM

## 2024-03-13 DIAGNOSIS — Z79899 Other long term (current) drug therapy: Secondary | ICD-10-CM | POA: Diagnosis not present

## 2024-03-13 DIAGNOSIS — I4811 Longstanding persistent atrial fibrillation: Secondary | ICD-10-CM

## 2024-03-13 LAB — CBC WITH DIFF/PLATELET
Basophils Absolute: 0.1 x10E3/uL (ref 0.0–0.2)
Basos: 1 %
EOS (ABSOLUTE): 0 x10E3/uL (ref 0.0–0.4)
Eos: 0 %
Hematocrit: 40.8 % (ref 34.0–46.6)
Hemoglobin: 12.9 g/dL (ref 11.1–15.9)
Immature Grans (Abs): 0 x10E3/uL (ref 0.0–0.1)
Immature Granulocytes: 0 %
Lymphocytes Absolute: 1.1 x10E3/uL (ref 0.7–3.1)
Lymphs: 12 %
MCH: 27 pg (ref 26.6–33.0)
MCHC: 31.6 g/dL (ref 31.5–35.7)
MCV: 86 fL (ref 79–97)
Monocytes Absolute: 0.8 x10E3/uL (ref 0.1–0.9)
Monocytes: 9 %
Neutrophils Absolute: 6.9 x10E3/uL (ref 1.4–7.0)
Neutrophils: 78 %
Platelets: 316 x10E3/uL (ref 150–450)
RBC: 4.77 x10E6/uL (ref 3.77–5.28)
RDW: 18 % — ABNORMAL HIGH (ref 11.7–15.4)
WBC: 8.9 x10E3/uL (ref 3.4–10.8)

## 2024-03-13 LAB — CMP14+EGFR
ALT: 9 IU/L (ref 0–32)
AST: 13 IU/L (ref 0–40)
Albumin: 3.9 g/dL (ref 3.7–4.7)
Alkaline Phosphatase: 111 IU/L (ref 48–129)
BUN/Creatinine Ratio: 20 (ref 12–28)
BUN: 18 mg/dL (ref 8–27)
Bilirubin Total: 0.6 mg/dL (ref 0.0–1.2)
CO2: 24 mmol/L (ref 20–29)
Calcium: 9.6 mg/dL (ref 8.7–10.3)
Chloride: 96 mmol/L (ref 96–106)
Creatinine, Ser: 0.92 mg/dL (ref 0.57–1.00)
Globulin, Total: 2.5 g/dL (ref 1.5–4.5)
Glucose: 85 mg/dL (ref 70–99)
Potassium: 4.4 mmol/L (ref 3.5–5.2)
Sodium: 134 mmol/L (ref 134–144)
Total Protein: 6.4 g/dL (ref 6.0–8.5)
eGFR: 60 mL/min/1.73 (ref >59)

## 2024-03-13 LAB — LIPID PANEL
Chol/HDL Ratio: 3 ratio (ref 0.0–4.4)
Cholesterol, Total: 161 mg/dL (ref 100–199)
HDL: 54 mg/dL (ref >39)
LDL Chol Calc (NIH): 91 mg/dL (ref 0–99)
Triglycerides: 87 mg/dL (ref 0–149)
VLDL Cholesterol Cal: 16 mg/dL (ref 5–40)

## 2024-03-13 MED ORDER — ZOLPIDEM TARTRATE 5 MG PO TABS: 5.0000 mg | ORAL_TABLET | Freq: Every evening | ORAL | 5 refills | Status: AC | PRN

## 2024-03-13 NOTE — Progress Notes (Signed)
 BP 111/60   Pulse 69   Temp (!) 97 F (36.1 C)   Ht 5' 6 (1.676 m)   Wt 153 lb 9.6 oz (69.7 kg)   SpO2 96%   BMI 24.79 kg/m    Subjective:   Patient ID: Rhonda [name], female    DOB: Mar 16, 1933, 89 y.o.   MRN: [id number]  HPI: Rhonda [name] is a 89 y.o. female presenting on 03/13/2024 for Annual Exam   Discussed the use of AI scribe software for clinical note transcription with the patient, who gave verbal consent to proceed.  History of Present Illness   Rhonda [name] is an 89 year old female with insomnia and atrial fibrillation who presents for a physical exam and recheck.  Insomnia - Chronic difficulty with sleep - Uses Ambien  most nights, with a prescription for thirty pills per month - Refills have been consistent - [place]  drug database showed no abnormalities - Last urine drug screen on March 13, 2023; another scheduled for today  Atrial fibrillation - History of atrial fibrillation - Recently underwent cardioversion after an emergency room visit - Heart was stopped and restarted during the procedure - Referred to AFib clinic for further management - Improved symptoms following cardioversion - First cardioversion procedure did not result in symptomatic improvement  Lower extremity edema - Swelling in legs, improved with use of diuretics taken as needed - Significant reduction in swelling - Manages edema by elevating legs and increasing ambulation  Upper respiratory congestion and hearing difficulty - Recent congestion, unable to see regular provider - Received antibiotic from another provider - Persistent congestion in ears - Difficulty hearing          Relevant past medical, surgical, family and social history reviewed and updated as indicated. Interim medical history since our last visit reviewed. Allergies and medications reviewed and updated.  Review of Systems  Constitutional:  Negative for chills and fever.  HENT:   Negative for congestion, ear discharge and ear pain.   Eyes:  Negative for redness and visual disturbance.  Respiratory:  Negative for chest tightness and shortness of breath.   Cardiovascular:  Negative for chest pain and leg swelling.  Genitourinary:  Negative for difficulty urinating and dysuria.  Musculoskeletal:  Positive for arthralgias. Negative for back pain and gait problem.  Skin:  Negative for rash.  Neurological:  Negative for light-headedness and headaches.  Psychiatric/Behavioral:  Positive for sleep disturbance. Negative for agitation and behavioral problems.   All other systems reviewed and are negative.   Per HPI unless specifically indicated above   Allergies as of 03/13/2024   No Known Allergies      Medication List        Accurate as of March 13, 2024  9:50 AM. If you have any questions, ask your nurse or doctor.          acetaminophen  650 MG CR tablet Commonly known as: TYLENOL  Take 1 tablet (650 mg total) by mouth every 8 (eight) hours as needed for pain.   amiodarone  200 MG tablet Commonly known as: PACERONE  Take 1 tablet (200 mg total) by mouth 2 (two) times daily for 30 days, THEN 1 tablet (200 mg total) daily. Start taking on: December 12, 2023   apixaban  5 MG Tabs tablet Commonly known as: Eliquis  Take 1 tablet (5 mg total) by mouth 2 (two) times daily.   diclofenac  Sodium 1 % Gel Commonly known as: Voltaren  Apply 2 g topically 4 (four) times daily.  What changed:  when to take this reasons to take this   diltiazem  30 MG tablet Commonly known as: Cardizem  Take 1 Tablet Every 4 Hours As Needed For HR >100 AND TOP BP>100   diltiazem  300 MG 24 hr capsule Commonly known as: CARDIZEM  CD Take 1 capsule (300 mg total) by mouth daily.   diltiazem  360 MG 24 hr capsule Commonly known as: TIAZAC  Take 360 mg by mouth daily.   furosemide  20 MG tablet Commonly known as: LASIX  Take 1 tablet (20 mg total) by mouth daily as needed.    potassium chloride  SA 20 MEQ tablet Commonly known as: KLOR-CON  M Take 1 tablet (20 mEq total) by mouth daily.   Vitamin D  50 MCG (2000 UT) tablet Take 2,000 Units by mouth 2 (two) times daily.   zolpidem  5 MG tablet Commonly known as: AMBIEN  Take 1 tablet (5 mg total) by mouth at bedtime as needed for sleep. Put on file until patient asked for refill         Objective:   BP 111/60   Pulse 69   Temp (!) 97 F (36.1 C)   Ht 5' 6 (1.676 m)   Wt 153 lb 9.6 oz (69.7 kg)   SpO2 96%   BMI 24.79 kg/m   Wt Readings from Last 3 Encounters:  03/13/24 153 lb 9.6 oz (69.7 kg)  03/10/24 152 lb (68.9 kg)  02/26/24 152 lb (68.9 kg)    Physical Exam Physical Exam   VITALS: P- 69, BP- 111/60 HEENT: Ears without infection or pressure. Pharynx normal. NECK: Thyroid  normal. CHEST: Lungs clear to auscultation bilaterally. CARDIOVASCULAR: Heart regular rate and rhythm, no AFib. EXTREMITIES: 2+ pitting edema in legs.         Assessment & Plan:   Problem List Items Addressed This Visit       Cardiovascular and Mediastinum   Atrial fibrillation (HCC)   Essential hypertension   Relevant Orders   CBC With Diff/Platelet   CMP14+EGFR   Lipid panel     Other   Insomnia   Relevant Medications   zolpidem  (AMBIEN ) 5 MG tablet   Other Relevant Orders   CBC With Diff/Platelet   CMP14+EGFR   Lipid panel   Other Visit Diagnoses       Physical exam    -  Primary   Relevant Orders   CBC With Diff/Platelet   CMP14+EGFR   Lipid panel     Controlled substance agreement signed       Relevant Orders   ToxASSURE Select 13 (MW), Urine          Adult Wellness Visit Routine wellness visit with normal vitals and clear lungs. Mild lower extremity edema noted, improved with diuretics. Hearing evaluation may be needed. - Ordered blood work. - Scheduled follow-up in six months.  Primary insomnia Chronic insomnia managed with Ambien . Consistent use without abnormalities in drug  database review. Last urine drug screen normal. - Continue Ambien  as prescribed.  Longstanding persistent atrial fibrillation Recent cardioversion with well-managed heart rate and rhythm. No current signs of atrial fibrillation.  Lower extremity edema Mild edema improved with diuretics, swelling about 2+. - Use diuretics as needed for swelling. - Elevate legs when sitting. - Encourage walking.          Follow up plan: Return in about 6 months (around 09/10/2024), or if symptoms worsen or fail to improve, for Recheck insomnia and A-fib.  Counseling provided for all of the vaccine components Orders Placed This  Encounter  Procedures   ToxASSURE Select 13 (MW), Urine   CBC With Diff/Platelet   CMP14+EGFR   Lipid panel    Fonda Levins, MD [facility] 03/13/2024, 9:50 AM

## 2024-03-13 NOTE — Addendum Note (Signed)
 Addended by: JODENE CORNERS B on: 03/13/2024 12:26 PM   Modules accepted: Orders

## 2024-03-16 LAB — TOXASSURE SELECT 13 (MW), URINE

## 2024-03-18 ENCOUNTER — Ambulatory Visit: Payer: Self-pay | Admitting: Family Medicine

## 2024-05-04 ENCOUNTER — Other Ambulatory Visit (HOSPITAL_COMMUNITY): Payer: Self-pay | Admitting: Internal Medicine

## 2024-05-04 DIAGNOSIS — I4819 Other persistent atrial fibrillation: Secondary | ICD-10-CM

## 2024-05-04 NOTE — Progress Notes (Unsigned)
 " Cardiology Office Note:   Date:  05/06/2024  ID:  Rhonda [name], DOB Feb 08, 1933, MRN [id number] PCP: Dettinger, Fonda LABOR, MD  [facility] Providers Cardiologist:  Lynwood Schilling, MD {  History of Present Illness:   Rhonda [name] is a 89 y.o. female with atrial fib.  She was in the hospital in Jan 2020 with this.  She was treated with flecainide , Eliquis  and Diltiazem .   In Feb she had a POET ([employer] Exercise Treadmill). In late June 2020 she was in the ED with SVT. She was treated with adenosine .   ED visit on 12/02/23 for Afib; s/p successful DCCV. Patient contacted office on 8/20 noting ERAF. Patient is on Eliquis  5 mg BID for a CHADS2VASC score of 4. On follow up 12/30/23, patient was in Afib with RVR. She was changed from flecainide  to amio and had DCCV on 9/19.    She was in NSR on 01/20/24.  Since the last cardioversion she has done OK.  The patient denies any new symptoms such as chest discomfort, neck or arm discomfort. There has been no new shortness of breath, PND or orthopnea. There have been no reported palpitations, presyncope or syncope.      ROS: As stated in the HPI and negative for all other systems.  Studies Reviewed:    EKG:   EKG Interpretation Date/Time:  Wednesday May 06 2024 14:01:33 EST Ventricular Rate:  65 PR Interval:  162 QRS Duration:  94 QT Interval:  454 QTC Calculation: 472 R Axis:   -37  Text Interpretation: Sinus rhythm with Premature supraventricular complexes Left axis deviation Minimal voltage criteria for LVH, may be normal variant ( Cornell product ) When compared with ECG of 20-Jan-2024 09:25, No significant change since last tracing Confirmed by Schilling Rattan ([id number]) on 05/06/2024 2:08:55 PM     Risk Assessment/Calculations:    CHA2DS2-VASc Score = 4   This indicates a 4.8% annual risk of stroke. The patient's score is based upon: CHF History: 0 HTN History: 1 Diabetes History: 0 Stroke History: 0 Vascular Disease  History: 0 Age Score: 2 Gender Score: 1    Physical Exam:   VS:  BP (!) 150/71 (BP Location: Left Arm, Patient Position: Sitting, Cuff Size: Normal)   Pulse 71   Resp 16   Ht 5' 6 (1.676 m)   Wt 156 lb 3.2 oz (70.9 kg)   SpO2 96%   BMI 25.21 kg/m    Wt Readings from Last 3 Encounters:  05/06/24 156 lb 3.2 oz (70.9 kg)  03/13/24 153 lb 9.6 oz (69.7 kg)  03/10/24 152 lb (68.9 kg)     GEN: Well nourished, well developed in no acute distress NECK: No JVD; No carotid bruits CARDIAC: RRR, no murmurs, rubs, gallops RESPIRATORY:  Clear to auscultation without rales, wheezing or rhonchi  ABDOMEN: Soft, non-tender, non-distended EXTREMITIES:  No edema; No deformity   ASSESSMENT AND PLAN:   Persistent atrial fibrillation:    The patient has had no symptomatic paroxysms.  She tolerates anticoagulation.  She will remain on the amiodarone .  When she comes back next time if not before she get liver enzymes and a TSH.  Hypertension: The blood pressure is at target at home although it slightly elevated today.  She will keep an eye on this.   Swelling:    This has been mild and actually resolved with normal sinus rhythm.  No change in therapy.   Follow up with me in six months.  Signed, Lynwood Schilling, MD   "

## 2024-05-06 ENCOUNTER — Encounter: Payer: Self-pay | Admitting: Cardiology

## 2024-05-06 ENCOUNTER — Ambulatory Visit: Admitting: Cardiology

## 2024-05-06 VITALS — BP 150/71 | HR 71 | Resp 16 | Ht 66.0 in | Wt 156.2 lb

## 2024-05-06 DIAGNOSIS — I4819 Other persistent atrial fibrillation: Secondary | ICD-10-CM | POA: Diagnosis not present

## 2024-05-06 DIAGNOSIS — I10 Essential (primary) hypertension: Secondary | ICD-10-CM | POA: Diagnosis not present

## 2024-05-06 DIAGNOSIS — R609 Edema, unspecified: Secondary | ICD-10-CM | POA: Diagnosis not present

## 2024-05-06 NOTE — Patient Instructions (Addendum)

## 2024-05-14 ENCOUNTER — Ambulatory Visit: Attending: Physician Assistant | Admitting: Physical Therapy

## 2024-05-14 ENCOUNTER — Encounter: Payer: Self-pay | Admitting: Physical Therapy

## 2024-05-14 ENCOUNTER — Other Ambulatory Visit: Payer: Self-pay

## 2024-05-14 DIAGNOSIS — M25512 Pain in left shoulder: Secondary | ICD-10-CM | POA: Insufficient documentation

## 2024-05-14 DIAGNOSIS — M25612 Stiffness of left shoulder, not elsewhere classified: Secondary | ICD-10-CM | POA: Insufficient documentation

## 2024-05-14 DIAGNOSIS — M62838 Other muscle spasm: Secondary | ICD-10-CM | POA: Diagnosis present

## 2024-05-14 DIAGNOSIS — M6281 Muscle weakness (generalized): Secondary | ICD-10-CM | POA: Diagnosis present

## 2024-05-14 DIAGNOSIS — G8929 Other chronic pain: Secondary | ICD-10-CM | POA: Insufficient documentation

## 2024-05-14 NOTE — Therapy (Signed)
 " OUTPATIENT PHYSICAL THERAPY SHOULDER EVALUATION   Patient Name: Rhonda [name] MRN: [id number] DOB:06-02-1932, 89 y.o., female Today's Date: 05/14/2024  END OF SESSION:  PT End of Session - 05/14/24 1414     Visit Number 1    Number of Visits 12    Date for Recertification  08/12/24    PT Start Time 0145    PT Stop Time 0237    PT Time Calculation (min) 52 min    Activity Tolerance Patient tolerated treatment well    Behavior During Therapy [facility] for tasks assessed/performed          Past Medical History:  Diagnosis Date   Arthritis    Atrial fibrillation (HCC) 05/2015   Cataract    Dysrhythmia    afib   Hypertension    Osteopenia    Syncope 04/2014   Past Surgical History:  Procedure Laterality Date   BREAST EXCISIONAL BIOPSY Left    BREAST EXCISIONAL BIOPSY Right    CARDIOVERSION N/A 01/10/2024   Procedure: CARDIOVERSION;  Surgeon: Francyne Headland, MD;  Location: [facility];  Service: Cardiovascular;  Laterality: N/A;   COLONOSCOPY  2011   KNEE ARTHROCENTESIS Bilateral    KNEE ARTHROSCOPY     bil   POLYPECTOMY  11-01-2009   ROTATOR CUFF REPAIR     SHOULDER SURGERY Left    TOTAL KNEE ARTHROPLASTY Left    TOTAL KNEE ARTHROPLASTY Right 10/12/2019   Procedure: TOTAL KNEE ARTHROPLASTY;  Surgeon: Melodi Lerner, MD;  Location: [facility];  Service: Orthopedics;  Laterality: Right;    VAGINAL HYSTERECTOMY     twice - vaginal and then oopherectomy   Patient Active Problem List   Diagnosis Date Noted   Palpitations 02/12/2023   Leg swelling 03/07/2020   OA (osteoarthritis) of knee 10/12/2019   Primary osteoarthritis of right knee 10/12/2019   Essential hypertension 08/06/2018   Insomnia 06/19/2018   Overweight (BMI 25.0-29.9) 09/19/2017   Low serum vitamin D  05/24/2016   Osteoporosis 05/23/2016   Chronic anticoagulation 03/19/2016   Atrial fibrillation (HCC) 07/13/2015   Syncope 05/05/2014   Acute maxillary sinusitis 01/21/2014   Other and  unspecified hyperlipidemia 01/07/2014   Vertigo 01/07/2014    REFERRING PROVIDER: Arvella Fireman PA-C  REFERRING DIAG: Pain in left shoulder joint.    THERAPY DIAG:  Chronic left shoulder pain  Stiffness of left shoulder, not elsewhere classified  Muscle weakness (generalized)  Other muscle spasm  Rationale for Evaluation and Treatment: Rehabilitation  ONSET DATE: Ongoing.    SUBJECTIVE:  SUBJECTIVE STATEMENT: The patient presents to the clinic with c/o left shoulder pain that has been ongoing.  Injections has been helpful.  She has a loss of motion that makes it difficult to perform haircare.  Her pain is rated at a 6/10.  She also states she is having some right-sided low back pain (seen for this at MD visit).  Discussed with patient that I will include this in her cert to the doctor to sign so that we can treat that area as well.    PERTINENT HISTORY: Please see above.    PAIN:  Are you having pain? Yes: NPRS scale: 6/10.   Pain location: Left shoulder.  Pain description: Ache.   Aggravating factors: Standing too long.  Moving left shoulder.  Relieving factors: Heat.   PRECAUTIONS: None  RED FLAGS: None   WEIGHT BEARING RESTRICTIONS: No  FALLS:  Has patient fallen in last 6 months? No  LIVING ENVIRONMENT: Lives in: House/apartment Has following equipment at home: None  OCCUPATION: Retired.    PLOF: Independent  PATIENT GOALS:Use left UE better and no have back pain.    NEXT MD VISIT:   OBJECTIVE:   PATIENT SURVEYS:  QUICKDASH score:  31 points (45.45%).    POSTURE: Forward head, rounded shoulder.  Decreased lumbar lordosis.    UPPER EXTREMITY ROM:   Active left shoulder flexion is 90 degrees, full ER and behind back to left SIJ.    UPPER EXTREMITY MMT:  IR with elbow  side 4/4, ER is 4-/5.  Middle deltoid 3- to 3/5.   SHOULDER SPECIAL TESTS: Pain with Impingement testing.  (-) left Drop Arm test.   PALPATION:  TTP over left posterior cuff musculature.                                                                                                                             TREATMENT DATE: 05/14/24:  HMP and Pre-mod e'stim (5 sec on and 5 sec off) to patient's left posterior cuff musculature f/b STW/M and passive left shoulder flexion (8 minutes).  Normal modality response following removal of modality.     PATIENT EDUCATION: Education details:  Person educated:  International aid/development worker:  Education comprehension:   HOME EXERCISE PROGRAM:   ASSESSMENT:  CLINICAL IMPRESSION: The patient presents to OPPT with c/o left shoulder pain and loss of motion and right-sided low back.  She has a loss of active left shoulder flexion and strength losses.  Her QUICKDASH score 31 points (45.45%).  She is TTP over her left posterior cuff musculature.  Patient will benefit from skilled PT intervention to address pain and deficits.     OBJECTIVE IMPAIRMENTS: decreased activity tolerance, decreased ROM, decreased strength, increased muscle spasms, and pain.   ACTIVITY LIMITATIONS: carrying, lifting, reach over head, and hygiene/grooming  PARTICIPATION LIMITATIONS: meal prep, cleaning, and laundry  PERSONAL FACTORS: Time since onset of injury/illness/exacerbation are also affecting patient's functional outcome.   REHAB POTENTIAL: Good  CLINICAL DECISION MAKING: Evolving/moderate complexity  EVALUATION COMPLEXITY: Moderate   GOALS:  SHORT TERM GOALS: Target date: 05/28/24  Ind with an initial HEP. Goal status: INITIAL   LONG TERM GOALS: Target date: 08/12/24.  Ind with an advanced HEP.  Goal status: INITIAL  2.  Active shoulder flexion to 125 degrees so the patient can easily reach overhead.  Goal status: INITIAL  3.  Increase left shoulder strength to a  solid 4/5 to increase stability for performance of functional activities.  Goal status: INITIAL  4.  Perform haircare with left UE.  Goal status: INITIAL  5.  Improve QUICKDASH score by 5 points.    Goal status: INITIAL  PLAN:  PT FREQUENCY: 2x/week  PT DURATION: 6 weeks  PLANNED INTERVENTIONS: 97110-Therapeutic exercises, 97530- Therapeutic activity, [id number]- Neuromuscular re-education, 97535- Self Care, [id number]- Manual therapy, [id number]- Electrical stimulation (unattended), [id number]- Vasopneumatic device, 97035- Ultrasound, Patient/Family education, and Moist heat  PLAN FOR NEXT SESSION: Pulleys, UE ranger, STW/M, PRE's.  Modalities as needed.     Vi Biddinger, PT 05/14/2024, 4:26 PM  "

## 2024-05-21 ENCOUNTER — Ambulatory Visit

## 2024-05-28 ENCOUNTER — Ambulatory Visit: Payer: Self-pay | Admitting: *Deleted

## 2024-06-01 ENCOUNTER — Ambulatory Visit: Admitting: *Deleted

## 2024-09-10 ENCOUNTER — Ambulatory Visit: Admitting: Family Medicine

## 2025-03-15 ENCOUNTER — Encounter: Admitting: Family Medicine
